# Patient Record
Sex: Female | Born: 1940 | Race: White | Hispanic: No | Smoking: Former smoker
Health system: Southern US, Community
[De-identification: ages and names within clinical notes are randomized; demographics above are authoritative.]

## PROBLEM LIST (undated history)

## (undated) DIAGNOSIS — E079 Disorder of thyroid, unspecified: Secondary | ICD-10-CM

## (undated) DIAGNOSIS — C4492 Squamous cell carcinoma of skin, unspecified: Secondary | ICD-10-CM

## (undated) DIAGNOSIS — C801 Malignant (primary) neoplasm, unspecified: Secondary | ICD-10-CM

## (undated) DIAGNOSIS — E119 Type 2 diabetes mellitus without complications: Secondary | ICD-10-CM

## (undated) DIAGNOSIS — E7800 Pure hypercholesterolemia, unspecified: Secondary | ICD-10-CM

## (undated) HISTORY — PX: ABDOMINAL HYSTERECTOMY: SHX81

## (undated) HISTORY — DX: Squamous cell carcinoma of skin, unspecified: C44.92

## (undated) HISTORY — PX: CATARACT EXTRACTION: SUR2

## (undated) HISTORY — PX: ROTATOR CUFF REPAIR: SHX139

## (undated) HISTORY — PX: REPLACEMENT TOTAL KNEE: SUR1224

---

## 2004-06-15 ENCOUNTER — Other Ambulatory Visit: Payer: Self-pay

## 2005-11-28 ENCOUNTER — Emergency Department: Payer: Self-pay | Admitting: Emergency Medicine

## 2005-12-01 ENCOUNTER — Ambulatory Visit: Payer: Self-pay | Admitting: Family Medicine

## 2005-12-09 ENCOUNTER — Ambulatory Visit: Payer: Self-pay | Admitting: Otolaryngology

## 2005-12-15 ENCOUNTER — Ambulatory Visit: Payer: Self-pay | Admitting: Otolaryngology

## 2006-04-25 ENCOUNTER — Ambulatory Visit: Payer: Self-pay | Admitting: Otolaryngology

## 2006-05-11 ENCOUNTER — Ambulatory Visit: Payer: Self-pay | Admitting: Oncology

## 2006-05-12 ENCOUNTER — Ambulatory Visit: Payer: Self-pay | Admitting: Oncology

## 2006-06-03 ENCOUNTER — Ambulatory Visit: Payer: Self-pay | Admitting: Oncology

## 2006-07-03 ENCOUNTER — Ambulatory Visit: Payer: Self-pay | Admitting: Oncology

## 2006-08-03 ENCOUNTER — Ambulatory Visit: Payer: Self-pay | Admitting: Oncology

## 2006-09-02 ENCOUNTER — Ambulatory Visit: Payer: Self-pay | Admitting: Oncology

## 2006-10-03 ENCOUNTER — Ambulatory Visit: Payer: Self-pay | Admitting: Oncology

## 2006-11-03 ENCOUNTER — Ambulatory Visit: Payer: Self-pay | Admitting: Oncology

## 2006-12-04 ENCOUNTER — Ambulatory Visit: Payer: Self-pay | Admitting: Family Medicine

## 2007-02-01 ENCOUNTER — Ambulatory Visit: Payer: Self-pay | Admitting: Oncology

## 2007-02-02 ENCOUNTER — Ambulatory Visit: Payer: Self-pay | Admitting: Oncology

## 2007-02-23 ENCOUNTER — Ambulatory Visit: Payer: Self-pay | Admitting: Oncology

## 2007-03-04 ENCOUNTER — Ambulatory Visit: Payer: Self-pay | Admitting: Oncology

## 2007-07-04 ENCOUNTER — Ambulatory Visit: Payer: Self-pay | Admitting: Oncology

## 2007-07-09 ENCOUNTER — Ambulatory Visit: Payer: Self-pay | Admitting: Oncology

## 2007-08-04 ENCOUNTER — Ambulatory Visit: Payer: Self-pay | Admitting: Oncology

## 2007-11-04 ENCOUNTER — Ambulatory Visit: Payer: Self-pay | Admitting: Oncology

## 2007-11-12 ENCOUNTER — Ambulatory Visit: Payer: Self-pay | Admitting: Oncology

## 2007-11-22 DIAGNOSIS — J309 Allergic rhinitis, unspecified: Secondary | ICD-10-CM | POA: Insufficient documentation

## 2007-11-22 DIAGNOSIS — I8290 Acute embolism and thrombosis of unspecified vein: Secondary | ICD-10-CM | POA: Insufficient documentation

## 2007-11-22 DIAGNOSIS — I82409 Acute embolism and thrombosis of unspecified deep veins of unspecified lower extremity: Secondary | ICD-10-CM | POA: Insufficient documentation

## 2007-11-22 DIAGNOSIS — J989 Respiratory disorder, unspecified: Secondary | ICD-10-CM | POA: Insufficient documentation

## 2007-11-22 DIAGNOSIS — M1990 Unspecified osteoarthritis, unspecified site: Secondary | ICD-10-CM | POA: Insufficient documentation

## 2007-11-23 DIAGNOSIS — M9979 Connective tissue and disc stenosis of intervertebral foramina of abdomen and other regions: Secondary | ICD-10-CM | POA: Insufficient documentation

## 2007-11-23 DIAGNOSIS — C8598 Non-Hodgkin lymphoma, unspecified, lymph nodes of multiple sites: Secondary | ICD-10-CM | POA: Insufficient documentation

## 2007-11-23 DIAGNOSIS — K2270 Barrett's esophagus without dysplasia: Secondary | ICD-10-CM | POA: Insufficient documentation

## 2007-11-26 DIAGNOSIS — E039 Hypothyroidism, unspecified: Secondary | ICD-10-CM | POA: Insufficient documentation

## 2007-12-02 ENCOUNTER — Ambulatory Visit: Payer: Self-pay | Admitting: Oncology

## 2007-12-25 ENCOUNTER — Ambulatory Visit: Payer: Self-pay | Admitting: Family Medicine

## 2008-01-03 ENCOUNTER — Ambulatory Visit: Payer: Self-pay | Admitting: Gastroenterology

## 2008-01-03 LAB — HM COLONOSCOPY

## 2008-02-08 ENCOUNTER — Emergency Department: Payer: Self-pay | Admitting: Internal Medicine

## 2008-02-15 ENCOUNTER — Emergency Department: Payer: Self-pay | Admitting: Emergency Medicine

## 2008-03-05 ENCOUNTER — Ambulatory Visit: Payer: Self-pay | Admitting: Gastroenterology

## 2008-03-26 ENCOUNTER — Ambulatory Visit: Payer: Self-pay | Admitting: Radiation Oncology

## 2008-03-26 ENCOUNTER — Ambulatory Visit: Payer: Self-pay | Admitting: Oncology

## 2008-04-02 ENCOUNTER — Ambulatory Visit: Payer: Self-pay | Admitting: Radiation Oncology

## 2008-05-03 ENCOUNTER — Ambulatory Visit: Payer: Self-pay | Admitting: Oncology

## 2008-05-28 ENCOUNTER — Ambulatory Visit: Payer: Self-pay | Admitting: Oncology

## 2008-06-03 ENCOUNTER — Ambulatory Visit: Payer: Self-pay | Admitting: Oncology

## 2008-11-03 ENCOUNTER — Ambulatory Visit: Payer: Self-pay | Admitting: Oncology

## 2008-11-27 ENCOUNTER — Ambulatory Visit: Payer: Self-pay | Admitting: Oncology

## 2008-12-01 ENCOUNTER — Ambulatory Visit: Payer: Self-pay | Admitting: Oncology

## 2009-03-03 ENCOUNTER — Ambulatory Visit: Payer: Self-pay | Admitting: Oncology

## 2009-03-26 ENCOUNTER — Ambulatory Visit: Payer: Self-pay | Admitting: Oncology

## 2009-04-02 ENCOUNTER — Ambulatory Visit: Payer: Self-pay | Admitting: Oncology

## 2009-04-19 ENCOUNTER — Emergency Department: Payer: Self-pay | Admitting: Emergency Medicine

## 2009-04-20 ENCOUNTER — Emergency Department: Payer: Self-pay | Admitting: Internal Medicine

## 2009-05-03 ENCOUNTER — Ambulatory Visit: Payer: Self-pay | Admitting: Oncology

## 2009-05-28 ENCOUNTER — Ambulatory Visit: Payer: Self-pay | Admitting: Oncology

## 2009-06-03 ENCOUNTER — Ambulatory Visit: Payer: Self-pay | Admitting: Oncology

## 2009-07-15 ENCOUNTER — Ambulatory Visit: Payer: Self-pay | Admitting: Family Medicine

## 2009-11-20 DIAGNOSIS — E559 Vitamin D deficiency, unspecified: Secondary | ICD-10-CM | POA: Insufficient documentation

## 2009-12-01 ENCOUNTER — Ambulatory Visit: Payer: Self-pay | Admitting: Oncology

## 2009-12-17 ENCOUNTER — Ambulatory Visit: Payer: Self-pay | Admitting: Oncology

## 2010-01-01 ENCOUNTER — Ambulatory Visit: Payer: Self-pay | Admitting: Oncology

## 2010-07-14 ENCOUNTER — Emergency Department: Payer: Self-pay | Admitting: Emergency Medicine

## 2010-08-20 ENCOUNTER — Ambulatory Visit: Payer: Self-pay | Admitting: Oncology

## 2010-09-02 ENCOUNTER — Ambulatory Visit: Payer: Self-pay | Admitting: Oncology

## 2010-09-08 ENCOUNTER — Ambulatory Visit: Payer: Self-pay | Admitting: Specialist

## 2010-10-06 ENCOUNTER — Ambulatory Visit: Payer: Self-pay | Admitting: Specialist

## 2010-10-21 ENCOUNTER — Ambulatory Visit: Payer: Self-pay | Admitting: Family Medicine

## 2011-01-10 ENCOUNTER — Inpatient Hospital Stay: Payer: Self-pay | Admitting: Surgery

## 2011-02-18 ENCOUNTER — Ambulatory Visit: Payer: Self-pay | Admitting: Oncology

## 2011-03-04 ENCOUNTER — Ambulatory Visit: Payer: Self-pay | Admitting: Oncology

## 2011-08-29 ENCOUNTER — Ambulatory Visit: Payer: Self-pay | Admitting: Oncology

## 2011-09-03 ENCOUNTER — Ambulatory Visit: Payer: Self-pay | Admitting: Oncology

## 2011-12-01 ENCOUNTER — Ambulatory Visit: Payer: Self-pay | Admitting: Family Medicine

## 2012-04-11 ENCOUNTER — Ambulatory Visit: Payer: Self-pay | Admitting: Family Medicine

## 2012-08-27 ENCOUNTER — Ambulatory Visit: Payer: Self-pay | Admitting: Oncology

## 2012-08-27 LAB — CBC CANCER CENTER
Basophil #: 0 x10 3/mm (ref 0.0–0.1)
Eosinophil #: 0.2 x10 3/mm (ref 0.0–0.7)
HGB: 13.1 g/dL (ref 12.0–16.0)
MCH: 31.6 pg (ref 26.0–34.0)
MCHC: 33.9 g/dL (ref 32.0–36.0)
Monocyte #: 0.5 x10 3/mm (ref 0.2–0.9)
Monocyte %: 9.7 %
Neutrophil #: 2.7 x10 3/mm (ref 1.4–6.5)
Neutrophil %: 50 %
Platelet: 183 x10 3/mm (ref 150–440)
RBC: 4.14 10*6/uL (ref 3.80–5.20)
RDW: 14.9 % — ABNORMAL HIGH (ref 11.5–14.5)

## 2012-08-27 LAB — COMPREHENSIVE METABOLIC PANEL
Alkaline Phosphatase: 63 U/L (ref 50–136)
Anion Gap: 12 (ref 7–16)
BUN: 16 mg/dL (ref 7–18)
Bilirubin,Total: 0.3 mg/dL (ref 0.2–1.0)
Co2: 27 mmol/L (ref 21–32)
Creatinine: 0.85 mg/dL (ref 0.60–1.30)
EGFR (African American): >60
EGFR (Non-African Amer.): >60
Osmolality: 282 (ref 275–301)
SGOT(AST): 17 U/L (ref 15–37)
SGPT (ALT): 26 U/L (ref 12–78)
Total Protein: 7.6 g/dL (ref 6.4–8.2)

## 2012-08-27 LAB — SEDIMENTATION RATE: Erythrocyte Sed Rate: 31 mm/hr — ABNORMAL HIGH (ref 0–30)

## 2012-09-02 ENCOUNTER — Ambulatory Visit: Payer: Self-pay | Admitting: Oncology

## 2013-03-26 ENCOUNTER — Ambulatory Visit: Payer: Self-pay | Admitting: Family Medicine

## 2013-08-26 ENCOUNTER — Ambulatory Visit: Payer: Self-pay | Admitting: Oncology

## 2013-08-26 LAB — CBC CANCER CENTER
Eosinophil %: 5.1 %
HCT: 37.6 % (ref 35.0–47.0)
HGB: 12.9 g/dL (ref 12.0–16.0)
MCH: 31.4 pg (ref 26.0–34.0)
MCHC: 34.2 g/dL (ref 32.0–36.0)
MCV: 92 fL (ref 80–100)
Monocyte #: 0.5 x10 3/mm (ref 0.2–0.9)
Monocyte %: 7.8 %
Neutrophil #: 3.6 x10 3/mm (ref 1.4–6.5)
Platelet: 212 x10 3/mm (ref 150–440)
RDW: 14.9 % — ABNORMAL HIGH (ref 11.5–14.5)

## 2013-08-26 LAB — COMPREHENSIVE METABOLIC PANEL
Alkaline Phosphatase: 63 U/L
Anion Gap: 6 — ABNORMAL LOW (ref 7–16)
BUN: 18 mg/dL (ref 7–18)
Bilirubin,Total: 0.4 mg/dL (ref 0.2–1.0)
Calcium, Total: 9 mg/dL (ref 8.5–10.1)
Chloride: 102 mmol/L (ref 98–107)
Co2: 31 mmol/L (ref 21–32)
Creatinine: 0.8 mg/dL (ref 0.60–1.30)
EGFR (African American): >60
EGFR (Non-African Amer.): >60
Glucose: 138 mg/dL — ABNORMAL HIGH (ref 65–99)
Sodium: 139 mmol/L (ref 136–145)
Total Protein: 7.6 g/dL (ref 6.4–8.2)

## 2013-08-26 LAB — LACTATE DEHYDROGENASE: LDH: 230 U/L (ref 81–246)

## 2013-09-02 ENCOUNTER — Ambulatory Visit: Payer: Self-pay | Admitting: Oncology

## 2014-06-04 DIAGNOSIS — Z8542 Personal history of malignant neoplasm of other parts of uterus: Secondary | ICD-10-CM | POA: Insufficient documentation

## 2014-06-04 DIAGNOSIS — Z8669 Personal history of other diseases of the nervous system and sense organs: Secondary | ICD-10-CM | POA: Insufficient documentation

## 2014-06-04 DIAGNOSIS — D239 Other benign neoplasm of skin, unspecified: Secondary | ICD-10-CM | POA: Insufficient documentation

## 2014-06-04 DIAGNOSIS — F458 Other somatoform disorders: Secondary | ICD-10-CM | POA: Insufficient documentation

## 2014-06-04 DIAGNOSIS — Q659 Congenital deformity of hip, unspecified: Secondary | ICD-10-CM | POA: Insufficient documentation

## 2014-09-04 LAB — LIPID PANEL
Cholesterol: 224 mg/dL — AB (ref 0–200)
HDL: 64 mg/dL (ref 35–70)
LDL Cholesterol: 134 mg/dL
Triglycerides: 129 mg/dL (ref 40–160)

## 2014-09-04 LAB — CBC AND DIFFERENTIAL: WBC: 6.1 10*3/mL

## 2014-09-04 LAB — TSH: TSH: 0.31 u[IU]/mL — AB (ref 0.41–5.90)

## 2014-09-04 LAB — BASIC METABOLIC PANEL
BUN: 14 mg/dL (ref 4–21)
Creatinine: 0.8 mg/dL (ref 0.5–1.1)
GLUCOSE: 113 mg/dL
SODIUM: 141 mmol/L (ref 137–147)

## 2014-09-04 LAB — HEMOGLOBIN A1C: Hgb A1c MFr Bld: 5.9 % (ref 4.0–6.0)

## 2014-09-11 LAB — HM DEXA SCAN: HM DEXA SCAN: NORMAL

## 2014-10-06 ENCOUNTER — Ambulatory Visit: Payer: Self-pay | Admitting: Family Medicine

## 2014-10-06 LAB — HM MAMMOGRAPHY

## 2014-10-20 ENCOUNTER — Emergency Department: Payer: Self-pay | Admitting: Emergency Medicine

## 2014-10-20 LAB — CBC WITH DIFFERENTIAL/PLATELET
Basophil #: 0 10*3/uL (ref 0.0–0.1)
Basophil %: 0.2 %
Eosinophil #: 0.1 10*3/uL (ref 0.0–0.7)
Eosinophil %: 1.2 %
HCT: 41.5 % (ref 35.0–47.0)
HGB: 13.8 g/dL (ref 12.0–16.0)
Lymphocyte #: 0.4 10*3/uL — ABNORMAL LOW (ref 1.0–3.6)
Lymphocyte %: 5.5 %
MCH: 30.6 pg (ref 26.0–34.0)
MCHC: 33.3 g/dL (ref 32.0–36.0)
MCV: 92 fL (ref 80–100)
MONO ABS: 0.2 x10 3/mm (ref 0.2–0.9)
Monocyte %: 3.1 %
NEUTROS PCT: 90 %
Neutrophil #: 6.9 10*3/uL — ABNORMAL HIGH (ref 1.4–6.5)
PLATELETS: 164 10*3/uL (ref 150–440)
RBC: 4.52 10*6/uL (ref 3.80–5.20)
RDW: 15 % — ABNORMAL HIGH (ref 11.5–14.5)
WBC: 7.7 10*3/uL (ref 3.6–11.0)

## 2014-10-20 LAB — COMPREHENSIVE METABOLIC PANEL
ALBUMIN: 3.8 g/dL (ref 3.4–5.0)
ALK PHOS: 54 U/L
Anion Gap: 9 (ref 7–16)
BILIRUBIN TOTAL: 0.9 mg/dL (ref 0.2–1.0)
BUN: 20 mg/dL — ABNORMAL HIGH (ref 7–18)
CREATININE: 0.83 mg/dL (ref 0.60–1.30)
Calcium, Total: 8.3 mg/dL — ABNORMAL LOW (ref 8.5–10.1)
Chloride: 101 mmol/L (ref 98–107)
Co2: 26 mmol/L (ref 21–32)
EGFR (African American): >60
EGFR (Non-African Amer.): >60
Glucose: 123 mg/dL — ABNORMAL HIGH (ref 65–99)
OSMOLALITY: 276 (ref 275–301)
POTASSIUM: 3.7 mmol/L (ref 3.5–5.1)
SGOT(AST): 17 U/L (ref 15–37)
SGPT (ALT): 24 U/L
Sodium: 136 mmol/L (ref 136–145)
Total Protein: 7.7 g/dL (ref 6.4–8.2)

## 2014-10-20 LAB — TROPONIN I: Troponin-I: <0.02 ng/mL

## 2014-10-20 LAB — LIPASE, BLOOD: LIPASE: 106 U/L (ref 73–393)

## 2015-03-04 ENCOUNTER — Other Ambulatory Visit: Payer: Self-pay | Admitting: Family Medicine

## 2015-04-29 ENCOUNTER — Other Ambulatory Visit: Payer: Self-pay | Admitting: Family Medicine

## 2015-04-29 DIAGNOSIS — M129 Arthropathy, unspecified: Secondary | ICD-10-CM

## 2015-05-26 ENCOUNTER — Other Ambulatory Visit: Payer: Self-pay | Admitting: Family Medicine

## 2015-05-26 DIAGNOSIS — E039 Hypothyroidism, unspecified: Secondary | ICD-10-CM

## 2015-05-26 DIAGNOSIS — E7800 Pure hypercholesterolemia, unspecified: Secondary | ICD-10-CM | POA: Insufficient documentation

## 2015-05-26 NOTE — Telephone Encounter (Signed)
Last apt 11/2014  Thanks,   -Mickel Baas

## 2015-05-28 ENCOUNTER — Other Ambulatory Visit: Payer: Self-pay

## 2015-05-28 ENCOUNTER — Emergency Department
Admission: EM | Admit: 2015-05-28 | Discharge: 2015-05-28 | Disposition: A | Discharge status: Home / Self Care | Payer: PPO | Attending: Emergency Medicine | Admitting: Emergency Medicine

## 2015-05-28 ENCOUNTER — Encounter: Payer: Self-pay | Admitting: Emergency Medicine

## 2015-05-28 DIAGNOSIS — M778 Other enthesopathies, not elsewhere classified: Secondary | ICD-10-CM

## 2015-05-28 DIAGNOSIS — Z791 Long term (current) use of non-steroidal anti-inflammatories (NSAID): Secondary | ICD-10-CM | POA: Insufficient documentation

## 2015-05-28 DIAGNOSIS — Z87891 Personal history of nicotine dependence: Secondary | ICD-10-CM | POA: Insufficient documentation

## 2015-05-28 DIAGNOSIS — Z7952 Long term (current) use of systemic steroids: Secondary | ICD-10-CM | POA: Diagnosis not present

## 2015-05-28 DIAGNOSIS — E119 Type 2 diabetes mellitus without complications: Secondary | ICD-10-CM | POA: Diagnosis not present

## 2015-05-28 DIAGNOSIS — Z79899 Other long term (current) drug therapy: Secondary | ICD-10-CM | POA: Insufficient documentation

## 2015-05-28 DIAGNOSIS — M7582 Other shoulder lesions, left shoulder: Secondary | ICD-10-CM

## 2015-05-28 DIAGNOSIS — M7592 Shoulder lesion, unspecified, left shoulder: Secondary | ICD-10-CM | POA: Diagnosis not present

## 2015-05-28 DIAGNOSIS — M79602 Pain in left arm: Secondary | ICD-10-CM | POA: Diagnosis present

## 2015-05-28 HISTORY — DX: Pure hypercholesterolemia, unspecified: E78.00

## 2015-05-28 HISTORY — DX: Type 2 diabetes mellitus without complications: E11.9

## 2015-05-28 HISTORY — DX: Disorder of thyroid, unspecified: E07.9

## 2015-05-28 LAB — CBC WITH DIFFERENTIAL/PLATELET
BASOS PCT: 1 %
Basophils Absolute: 0 10*3/uL (ref 0–0.1)
EOS ABS: 0.2 10*3/uL (ref 0–0.7)
EOS PCT: 4 %
HCT: 36.8 % (ref 35.0–47.0)
Hemoglobin: 12.7 g/dL (ref 12.0–16.0)
LYMPHS ABS: 1.3 10*3/uL (ref 1.0–3.6)
Lymphocytes Relative: 28 %
MCH: 31.4 pg (ref 26.0–34.0)
MCHC: 34.6 g/dL (ref 32.0–36.0)
MCV: 90.8 fL (ref 80.0–100.0)
MONOS PCT: 8 %
Monocytes Absolute: 0.4 10*3/uL (ref 0.2–0.9)
NEUTROS PCT: 59 %
Neutro Abs: 2.7 10*3/uL (ref 1.4–6.5)
PLATELETS: 169 10*3/uL (ref 150–440)
RBC: 4.05 MIL/uL (ref 3.80–5.20)
RDW: 14.2 % (ref 11.5–14.5)
WBC: 4.6 10*3/uL (ref 3.6–11.0)

## 2015-05-28 LAB — COMPREHENSIVE METABOLIC PANEL
ALBUMIN: 4.1 g/dL (ref 3.5–5.0)
ALT: 17 U/L (ref 14–54)
ANION GAP: 11 (ref 5–15)
AST: 35 U/L (ref 15–41)
Alkaline Phosphatase: 41 U/L (ref 38–126)
BUN: 17 mg/dL (ref 6–20)
CHLORIDE: 105 mmol/L (ref 101–111)
CO2: 25 mmol/L (ref 22–32)
Calcium: 9.2 mg/dL (ref 8.9–10.3)
Creatinine, Ser: 0.81 mg/dL (ref 0.44–1.00)
GFR calc non Af Amer: >60 mL/min (ref >60)
GLUCOSE: 142 mg/dL — AB (ref 65–99)
POTASSIUM: 3.5 mmol/L (ref 3.5–5.1)
SODIUM: 141 mmol/L (ref 135–145)
Total Bilirubin: 0.6 mg/dL (ref 0.3–1.2)
Total Protein: 7.4 g/dL (ref 6.5–8.1)

## 2015-05-28 LAB — TROPONIN I: Troponin I: <0.03 ng/mL (ref <0.031)

## 2015-05-28 MED ORDER — OXYCODONE-ACETAMINOPHEN 5-325 MG PO TABS: 1.0000 | ORAL_TABLET | Freq: Four times a day (QID) | ORAL | Status: DC | PRN

## 2015-05-28 MED ORDER — DIAZEPAM 5 MG PO TABS: 5.0000 mg | ORAL_TABLET | Freq: Three times a day (TID) | ORAL | Status: DC | PRN

## 2015-05-28 MED ORDER — NAPROXEN 500 MG PO TABS
500.0000 mg | ORAL_TABLET | Freq: Once | ORAL | Status: AC
  Administered 2015-05-28: 500 mg via ORAL
  Filled 2015-05-28: qty 1

## 2015-05-28 MED ORDER — DEXAMETHASONE 4 MG PO TABS
6.0000 mg | ORAL_TABLET | Freq: Once | ORAL | Status: AC
  Administered 2015-05-28: 6 mg via ORAL
  Filled 2015-05-28: qty 1.5

## 2015-05-28 MED ORDER — OXYCODONE-ACETAMINOPHEN 5-325 MG PO TABS
1.0000 | ORAL_TABLET | Freq: Once | ORAL | Status: AC
  Administered 2015-05-28: 1 via ORAL
  Filled 2015-05-28: qty 1

## 2015-05-28 NOTE — ED Provider Notes (Signed)
[facility] Emergency Department Provider Note  ____________________________________________  Time seen: 12:30 PM  I have reviewed the triage vital signs and the nursing notes.   HISTORY  Chief Complaint Arm Pain    HPI Sherry Jones is a 74 y.o. female who reports left shoulder pain for the past 3 days. It is intermittent lasting 20-30 minutes. Not exertional, not pleuritic. No chest pain or shortness of breath. The pain as a throbbing pain that starts at the shoulder and proceeds laterally down the arm to the elbow. It is worse with movement. She denies any recent injury slips trips or falls. No heavy lifting. She did have an eye surgery for cataract removal one day prior to the onset of this. She has been taking eyedrops antibiotic steroid and NSAID since then.  No fevers or chills, nausea vomiting, abdominal pain, back pain, headache neck pain or vision changes.   Past Medical History  Diagnosis Date  . Diabetes mellitus without complication   . Hypercholesterolemia   . Thyroid disease    diabetes is diet controlled  Patient Active Problem List   Diagnosis Date Noted  . Hypercholesteremia 05/26/2015  . Arthropathy 04/29/2015  . Acquired hypothyroidism 11/26/2007    Past Surgical History  Procedure Laterality Date  . Eye surgery    . Abdominal hysterectomy      Current Outpatient Rx  Name  Route  Sig  Dispense  Refill  . Difluprednate (DUREZOL) 0.05 % EMUL   Right Eye   Place 1 drop into the right eye 3 (three) times daily.         Marland Kitchen levothyroxine (SYNTHROID, LEVOTHROID) 75 MCG tablet      TAKE 1 TABLET BY MOUTH EVERY DAY   90 tablet   3   . meloxicam (MOBIC) 15 MG tablet      TAKE 1 TABLET BY MOUTH EVERY DAY   90 tablet   3   . nepafenac (ILEVRO) 0.3 % ophthalmic suspension   Right Eye   Place 1 drop into the right eye at bedtime.         . simvastatin (ZOCOR) 10 MG tablet      TAKE 1 TABLET BY MOUTH AT BEDTIME   30  tablet   5   . tobramycin (TOBREX) 0.3 % ophthalmic solution   Right Eye   Place 1 drop into the right eye every 6 (six) hours.         . Vitamin D, Ergocalciferol, (DRISDOL) 50000 UNITS CAPS capsule   Oral   Take 50,000 Units by mouth every 30 (thirty) days.         . diazepam (VALIUM) 5 MG tablet   Oral   Take 1 tablet (5 mg total) by mouth every 8 (eight) hours as needed for muscle spasms.   8 tablet   0   . oxyCODONE-acetaminophen (ROXICET) 5-325 MG per tablet   Oral   Take 1 tablet by mouth every 6 (six) hours as needed for severe pain.   12 tablet   0     Allergies Codeine  No family history on file.  Social History Social History  Substance Use Topics  . Smoking status: Former Research scientist (life sciences)  . Smokeless tobacco: None  . Alcohol Use: No    Review of Systems  Constitutional: No fever or chills. No weight changes Eyes:No blurry vision or double vision.  ENT: No sore throat. Cardiovascular: No chest pain. Respiratory: No dyspnea or cough. Gastrointestinal: Negative for  abdominal pain, vomiting and diarrhea.  No BRBPR or melena. Genitourinary: Negative for dysuria, urinary retention, bloody urine, or difficulty urinating. Musculoskeletal: Left shoulder and upper arm pain Skin: Negative for rash. Neurological: Negative for headaches, focal weakness or numbness. Psychiatric:No anxiety or depression.   Endocrine:No hot/cold intolerance, changes in energy, or sleep difficulty.  10-point ROS otherwise negative.  ____________________________________________   PHYSICAL EXAM:  VITAL SIGNS: ED Triage Vitals  Enc Vitals Group     BP 05/28/15 1121 132/75 mmHg     Pulse Rate 05/28/15 1121 78     Resp 05/28/15 1121 14     Temp 05/28/15 1121 97.8 F (36.6 C)     Temp Source 05/28/15 1121 Oral     SpO2 05/28/15 1121 100 %     Weight 05/28/15 1121 115 lb (52.164 kg)     Height 05/28/15 1121 4\' 6"  (1.372 m)     Head Cir --      Peak Flow --      Pain Score  05/28/15 1122 0     Pain Loc --      Pain Edu? --      Excl. in [place]? --      Constitutional: Alert and oriented. Well appearing and in no distress. Eyes: No scleral icterus. No conjunctival pallor. PERRL. EOMI ENT   Head: Normocephalic and atraumatic.   Nose: No congestion/rhinnorhea. No septal hematoma   Mouth/Throat: MMM, no pharyngeal erythema. No peritonsillar mass. No uvula shift.   Neck: No stridor. No SubQ emphysema. No meningismus. Hematological/Lymphatic/Immunilogical: No cervical lymphadenopathy. Cardiovascular: RRR. Normal and symmetric distal pulses are present in all extremities. No murmurs, rubs, or gallops. Respiratory: Normal respiratory effort without tachypnea nor retractions. Breath sounds are clear and equal bilaterally. No wheezes/rales/rhonchi. Gastrointestinal: Soft and nontender. No distention. There is no CVA tenderness.  No rebound, rigidity, or guarding. Genitourinary: deferred Musculoskeletal: Severe tenderness to palpation of the lateral and posterior deltoid heads. Stressing the deltoid muscle against resistance also reproduces the pain. Rest of the arm and chest wall are unremarkable and nontender with full range of motion of the shoulder elbow and hands. Neurologic:   Normal speech and language.  CN 2-10 normal. Motor grossly intact. No pronator drift.  Normal gait. No gross focal neurologic deficits are appreciated.  Skin:  Skin is warm, dry and intact. No rash noted.  No petechiae, purpura, or bullae. Psychiatric: Mood and affect are normal. Speech and behavior are normal. Patient exhibits appropriate insight and judgment.  ____________________________________________    LABS (pertinent positives/negatives) (all labs ordered are listed, but only abnormal results are displayed) Labs Reviewed  COMPREHENSIVE METABOLIC PANEL - Abnormal; Notable for the following:    Glucose, Bld 142 (*)    All other components within normal limits  CBC  WITH DIFFERENTIAL/PLATELET  TROPONIN I   ____________________________________________   EKG  Interpreted by me Normal sinus rhythm rate of 79, normal axis intervals QRS and ST segments and T waves.  ____________________________________________    RADIOLOGY    ____________________________________________   PROCEDURES  ____________________________________________   INITIAL IMPRESSION / ASSESSMENT AND PLAN / ED COURSE  Pertinent labs & imaging results that were available during my care of the patient were reviewed by me and considered in my medical decision making (see chart for details).  Patient presents with clinically apparent deltoid tendinitis. No clear cause of this, but there does not appear to be any cellulitis abscess necrotizing fasciitis fracture or dislocation. Very low suspicion for cardiopulmonary pathology such  as ACS PE TAD pneumothorax carditis mediastinitis pneumonia or sepsis. Patient given a one-time dose of Decadron in the ED, will be prescribed Percocet and Valium as well as given a sling for symptom control. I did encourage her to follow up with primary care for further monitoring of her symptoms and consideration of physical therapy referral as needed.  ____________________________________________   FINAL CLINICAL IMPRESSION(S) / ED DIAGNOSES  Final diagnoses:  Deltoid tendonitis of left shoulder      Carrie Mew, MD 05/28/15 1255

## 2015-05-28 NOTE — ED Notes (Signed)
Had some eye surgery on Monday  Developed left shoulder and arm pain since

## 2015-05-28 NOTE — Discharge Instructions (Signed)
You were prescribed a medication that is potentially sedating. Do not drink alcohol, drive or participate in any other potentially dangerous activities while taking this medication as it may make you sleepy. Do not take this medication with any other sedating medications, either prescription or over-the-counter. If you were prescribed Percocet or Vicodin, do not take these with acetaminophen (Tylenol) as it is already contained within these medications.   Opioid pain medications (or "narcotics") can be habit forming.  Use it as little as possible to achieve adequate pain control.  Do not use or use it with extreme caution if you have a history of opiate abuse or dependence.  If you are on a pain contract with your primary care doctor or a pain specialist, be sure to let them know you were prescribed this medication today from the [facility] Emergency Department.  This medication is intended for your use only - do not give any to anyone else and keep it in a secure place where nobody else, especially children and pets, have access to it.  It will also cause or worsen constipation, so you may want to consider taking an over-the-counter stool softener while you are taking this medication.  Your evaluation today reveals tendinitis of the deltoid muscle tendons around the left shoulder. Wear the sling for comfort, and take Percocet as needed for pain. Rest the shoulder for the next week, and use a heating pad to help relax the muscles. You may use Valium at night to help with muscle relaxation and sleep. Follow up with Dr. Venia Minks within one week for further evaluation and consideration of possible physical therapy referral.  Tendinitis Tendinitis is swelling and inflammation of the tendons. Tendons are band-like tissues that connect muscle to bone. Tendinitis commonly occurs in the:   Shoulders (rotator cuff).  Heels (Achilles tendon).  Elbows (triceps tendon). CAUSES Tendinitis is usually caused  by overusing the tendon, muscles, and joints involved. When the tissue surrounding a tendon (synovium) becomes inflamed, it is called tenosynovitis. Tendinitis commonly develops in people whose jobs require repetitive motions. SYMPTOMS  Pain.  Tenderness.  Mild swelling. DIAGNOSIS Tendinitis is usually diagnosed by physical exam. Your health care provider may also order X-rays or other imaging tests. TREATMENT Your health care provider may recommend certain medicines or exercises for your treatment. HOME CARE INSTRUCTIONS   Use a sling or splint for as long as directed by your health care provider until the pain decreases.  Put ice on the injured area.  Put ice in a plastic bag.  Place a towel between your skin and the bag.  Leave the ice on for 15-20 minutes, 3-4 times a day, or as directed by your health care provider.  Avoid using the limb while the tendon is painful. Perform gentle range of motion exercises only as directed by your health care provider. Stop exercises if pain or discomfort increase, unless directed otherwise by your health care provider.  Only take over-the-counter or prescription medicines for pain, discomfort, or fever as directed by your health care provider. SEEK MEDICAL CARE IF:   Your pain and swelling increase.  You develop new, unexplained symptoms, especially increased numbness in the hands. MAKE SURE YOU:   Understand these instructions.  Will watch your condition.  Will get help right away if you are not doing well or get worse. Document Released: 09/16/2000 Document Revised: 02/03/2014 Document Reviewed: 12/06/2010 [facility] Patient Information 2015 [place], [place]. This information is not intended to replace advice given to you  by your health care provider. Make sure you discuss any questions you have with your health care provider. ° °

## 2015-05-29 ENCOUNTER — Ambulatory Visit (INDEPENDENT_AMBULATORY_CARE_PROVIDER_SITE_OTHER): Payer: PPO | Admitting: Family Medicine

## 2015-05-29 ENCOUNTER — Telehealth: Payer: Self-pay | Admitting: Family Medicine

## 2015-05-29 ENCOUNTER — Encounter: Payer: Self-pay | Admitting: Family Medicine

## 2015-05-29 VITALS — BP 124/54 | HR 84 | Temp 98.1°F | Resp 16 | Ht <= 58 in | Wt 119.0 lb

## 2015-05-29 DIAGNOSIS — D229 Melanocytic nevi, unspecified: Secondary | ICD-10-CM | POA: Insufficient documentation

## 2015-05-29 DIAGNOSIS — M25512 Pain in left shoulder: Secondary | ICD-10-CM | POA: Insufficient documentation

## 2015-05-29 NOTE — Progress Notes (Signed)
Subjective:    Patient ID: Sherry Jones, female    DOB: [birth year], 74 y.o.   MRN: [id number]  Arm Pain  The incident occurred 3 to 5 days ago. There was no injury mechanism. The pain is present in the left shoulder. Quality: "pulsating throb" The pain radiates to the left hand. The pain is at a severity of 5/10. The pain is moderate. The pain has been improving since the incident. Associated symptoms include tingling. Pertinent negatives include no chest pain, muscle weakness or numbness. Associated symptoms comments: "My little finger will get cold". She has tried acetaminophen for the symptoms. The treatment provided moderate relief.     Follow up Hospitalization  Patient was admitted to [facility] on 05/28/2015 and discharged on same day. She was treated for deltoid tendonitis of left shoulder. Treatment for this included Valium and Percocet. She reports no compliance with treatment. The Percocet causes N/V, and pt has not tried the Valium, would like to ask PCP's advice about this medication. She reports this condition is Improved. Did have eye surgery on Monday. Went to ER because they were concerned about blood clot or heart.  Was ok. Diagnosed with tendonitis as above.  Can not take codeine or hydrocodone.  In a sling today. ------------------------------------------------------------------------------------   Review of Systems  Constitutional: Positive for activity change. Negative for fever, chills, diaphoresis, appetite change, fatigue and unexpected weight change.  Cardiovascular: Negative for chest pain.  Neurological: Positive for tingling. Negative for numbness.   BP 124/54 mmHg  Pulse 84  Temp(Src) 98.1 F (36.7 C) (Oral)  Resp 16  Ht 4\' 6"  (1.372 m)  Wt 119 lb (53.978 kg)  BMI 28.68 kg/m2   Patient Active Problem List   Diagnosis Date Noted  . Atypical nevus 05/29/2015  . Shoulder pain, left 05/29/2015  . Hypercholesteremia 05/26/2015  . Arthropathy 04/29/2015  .  Benign neoplasm of skin 06/04/2014  . 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 H/O ear disorder 06/04/2014  . Cancer of uterus 06/04/2014  . Avitaminosis D 11/20/2009  . Diabetes mellitus, type 2 07/13/2009  . Acquired hypothyroidism 11/26/2007  . Adult hypothyroidism 11/26/2007  . Barrett esophagus 11/23/2007  . Narrowing of intervertebral disc space 11/23/2007  . 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 Acute embolism and thrombosis of deep vein of lower extremity 11/22/2007  . Allergic rhinitis 11/22/2007  . Arthritis, degenerative 11/22/2007  . Disease of airway 11/22/2007   Past Medical History  Diagnosis Date  . Diabetes mellitus without complication   . Hypercholesterolemia   . Thyroid disease    Current Outpatient Prescriptions on File Prior to Visit  Medication Sig  . Difluprednate (DUREZOL) 0.05 % EMUL Place 1 drop into the right eye 3 (three) times daily.  Marland Kitchen levothyroxine (SYNTHROID, LEVOTHROID) 75 MCG tablet TAKE 1 TABLET BY MOUTH EVERY DAY  . meloxicam (MOBIC) 15 MG tablet TAKE 1 TABLET BY MOUTH EVERY DAY  . nepafenac (ILEVRO) 0.3 % ophthalmic suspension Place 1 drop into the right eye at bedtime.  . simvastatin (ZOCOR) 10 MG tablet TAKE 1 TABLET BY MOUTH AT BEDTIME  . tobramycin (TOBREX) 0.3 % ophthalmic solution Place 1 drop into the right eye every 6 (six) hours.  . Vitamin D, Ergocalciferol, (DRISDOL) 50000 UNITS CAPS capsule Take 50,000 Units by mouth every 30 (thirty) days.   No current facility-administered medications on file prior to visit.   Allergies  Allergen Reactions  .  Cefadroxil   . Cefuroxime   . Cephalosporins Other (See Comments)  . Ciprofloxacin Other (See Comments)  . Codeine Swelling  . Hydrocodone-Acetaminophen Other (See Comments)  . Levofloxacin Other (See Comments)  . Meloxicam   . Oxycodone   . Tramadol   . Methocarbamol Rash  . Nitrofuran Derivatives  Other (See Comments)    Other Reaction: Intolerance   Past Surgical History  Procedure Laterality Date  . Abdominal hysterectomy    . Eye surgery     Social History   Social History  . Marital Status: Married    Spouse Name: N/A  . Number of Children: N/A  . Years of Education: N/A   Occupational History  . Not on file.   Social History Main Topics  . Smoking status: Former Smoker -- 95 years    Quit date: 01/02/1996  . Smokeless tobacco: Never Used  . Alcohol Use: No  . Drug Use: No  . Sexual Activity: Not on file   Other Topics Concern  . Not on file   Social History Narrative   No family history on file.     Objective:   Physical Exam  Constitutional: She is oriented to person, place, and time. She appears well-developed and well-nourished.  Musculoskeletal: She exhibits tenderness (upper back. In sling. ).  Neurological: She is alert and oriented to person, place, and time.   BP 124/54 mmHg  Pulse 84  Temp(Src) 98.1 F (36.7 C) (Oral)  Resp 16  Ht 4\' 6"  (1.372 m)  Wt 119 lb (53.978 kg)  BMI 28.68 kg/m2       Assessment & Plan:  1. Shoulder pain, left Do not take hydrocodone or valium. Continue Meloxicam  And wear sling. Will refer back to orthopedic doctor, Dr. Alvin Critchley, at [facility] that did her shoulder surgery.    - Ambulatory referral to Orthopedic Surgery    Margarita Rana, MD

## 2015-05-29 NOTE — Telephone Encounter (Signed)
Pt was discharged from [facility] ER on 05/28/2015 for left arm pain.  I have scheduled an appt today for a follow up/MW

## 2015-06-16 DIAGNOSIS — E119 Type 2 diabetes mellitus without complications: Secondary | ICD-10-CM | POA: Insufficient documentation

## 2015-06-16 DIAGNOSIS — Z79899 Other long term (current) drug therapy: Secondary | ICD-10-CM | POA: Insufficient documentation

## 2015-06-16 DIAGNOSIS — Z87891 Personal history of nicotine dependence: Secondary | ICD-10-CM | POA: Insufficient documentation

## 2015-06-16 DIAGNOSIS — M5136 Other intervertebral disc degeneration, lumbar region: Secondary | ICD-10-CM | POA: Insufficient documentation

## 2015-06-16 DIAGNOSIS — R51 Headache: Secondary | ICD-10-CM | POA: Insufficient documentation

## 2015-06-17 ENCOUNTER — Encounter: Payer: Self-pay | Admitting: *Deleted

## 2015-06-17 ENCOUNTER — Emergency Department: Payer: PPO

## 2015-06-17 ENCOUNTER — Emergency Department
Admission: EM | Admit: 2015-06-17 | Discharge: 2015-06-17 | Disposition: A | Discharge status: Home / Self Care | Payer: PPO | Attending: Emergency Medicine | Admitting: Emergency Medicine

## 2015-06-17 DIAGNOSIS — M5136 Other intervertebral disc degeneration, lumbar region: Secondary | ICD-10-CM

## 2015-06-17 DIAGNOSIS — M549 Dorsalgia, unspecified: Secondary | ICD-10-CM

## 2015-06-17 DIAGNOSIS — R51 Headache: Secondary | ICD-10-CM | POA: Diagnosis not present

## 2015-06-17 DIAGNOSIS — Z79899 Other long term (current) drug therapy: Secondary | ICD-10-CM | POA: Diagnosis not present

## 2015-06-17 DIAGNOSIS — Z87891 Personal history of nicotine dependence: Secondary | ICD-10-CM | POA: Diagnosis not present

## 2015-06-17 DIAGNOSIS — E119 Type 2 diabetes mellitus without complications: Secondary | ICD-10-CM | POA: Diagnosis not present

## 2015-06-17 HISTORY — DX: Malignant (primary) neoplasm, unspecified: C80.1

## 2015-06-17 LAB — COMPREHENSIVE METABOLIC PANEL
ALBUMIN: 3.8 g/dL (ref 3.5–5.0)
ALK PHOS: 44 U/L (ref 38–126)
ALT: 17 U/L (ref 14–54)
ANION GAP: 8 (ref 5–15)
AST: 26 U/L (ref 15–41)
BUN: 15 mg/dL (ref 6–20)
CALCIUM: 8.7 mg/dL — AB (ref 8.9–10.3)
CO2: 26 mmol/L (ref 22–32)
Chloride: 106 mmol/L (ref 101–111)
Creatinine, Ser: 0.69 mg/dL (ref 0.44–1.00)
GFR calc non Af Amer: >60 mL/min (ref >60)
GLUCOSE: 158 mg/dL — AB (ref 65–99)
POTASSIUM: 3.2 mmol/L — AB (ref 3.5–5.1)
SODIUM: 140 mmol/L (ref 135–145)
Total Bilirubin: 0.8 mg/dL (ref 0.3–1.2)
Total Protein: 6.8 g/dL (ref 6.5–8.1)

## 2015-06-17 LAB — CBC
HCT: 34.2 % — ABNORMAL LOW (ref 35.0–47.0)
Hemoglobin: 11.9 g/dL — ABNORMAL LOW (ref 12.0–16.0)
MCH: 31.4 pg (ref 26.0–34.0)
MCHC: 34.7 g/dL (ref 32.0–36.0)
MCV: 90.5 fL (ref 80.0–100.0)
PLATELETS: 157 10*3/uL (ref 150–440)
RBC: 3.77 MIL/uL — ABNORMAL LOW (ref 3.80–5.20)
RDW: 14 % (ref 11.5–14.5)
WBC: 7.8 10*3/uL (ref 3.6–11.0)

## 2015-06-17 LAB — URINALYSIS COMPLETE WITH MICROSCOPIC (ARMC ONLY)
BACTERIA UA: NONE SEEN
Bilirubin Urine: NEGATIVE
GLUCOSE, UA: NEGATIVE mg/dL
KETONES UR: NEGATIVE mg/dL
LEUKOCYTES UA: NEGATIVE
NITRITE: NEGATIVE
PROTEIN: NEGATIVE mg/dL
RBC / HPF: NONE SEEN RBC/hpf (ref 0–5)
SPECIFIC GRAVITY, URINE: 1.005 (ref 1.005–1.030)
WBC UA: NONE SEEN WBC/hpf (ref 0–5)
pH: 6 (ref 5.0–8.0)

## 2015-06-17 LAB — TROPONIN I: Troponin I: <0.03 ng/mL (ref <0.031)

## 2015-06-17 MED ORDER — LIDOCAINE 5 % EX PTCH
1.0000 | MEDICATED_PATCH | CUTANEOUS | Status: DC
  Administered 2015-06-17: 1 via TRANSDERMAL
  Filled 2015-06-17 (×2): qty 1

## 2015-06-17 MED ORDER — DIAZEPAM 5 MG PO TABS
5.0000 mg | ORAL_TABLET | Freq: Once | ORAL | Status: AC
  Administered 2015-06-17: 5 mg via ORAL
  Filled 2015-06-17: qty 1

## 2015-06-17 MED ORDER — DIAZEPAM 5 MG PO TABS: 5.0000 mg | ORAL_TABLET | Freq: Three times a day (TID) | ORAL | Status: DC | PRN

## 2015-06-17 MED ORDER — LIDOCAINE 5 % EX PTCH: 1.0000 | MEDICATED_PATCH | CUTANEOUS | Status: DC

## 2015-06-17 MED ORDER — IOHEXOL 350 MG/ML SOLN
75.0000 mL | Freq: Once | INTRAVENOUS | Status: AC | PRN
  Administered 2015-06-17: 75 mL via INTRAVENOUS

## 2015-06-17 MED ORDER — KETOROLAC TROMETHAMINE 30 MG/ML IJ SOLN
30.0000 mg | Freq: Once | INTRAMUSCULAR | Status: AC
  Administered 2015-06-17: 30 mg via INTRAVENOUS
  Filled 2015-06-17: qty 1

## 2015-06-17 NOTE — ED Notes (Signed)
Pt to CT via stretcher

## 2015-06-17 NOTE — ED Notes (Signed)
Pt ret from CT

## 2015-06-17 NOTE — ED Notes (Signed)
Pt uprite on stretcher in exam room with no distress noted; reports lower back pain tonight with no injury, no hx of same, no accomp symptoms; pt also st has had some swelling and pain to left knee since yesterday with no known injury; +periph pulses, W&D with good movem/sensation noted

## 2015-06-17 NOTE — ED Notes (Signed)
MD request add'l attempt at IV start for CT; B Pearline Cables RN attempted twice without success

## 2015-06-17 NOTE — ED Notes (Addendum)
Pt up to room commode for cc urine; stand-by assist only; pt reports increased pain with ambulating; CT tech requiring 20GA IV cath above wrist; MD notified pt has been stuck x 5 with no preferred access able to be obtained

## 2015-06-17 NOTE — ED Notes (Signed)
Attempted IV to right hand twice without success; pt reports EMS also attemped twice en route without success; 2nd nurse called to room for attempt

## 2015-06-17 NOTE — ED Notes (Signed)
PT BROUGHT IN VIA EMS FROM HOME.  PT HAS BACK PAIN.  NO  KNOWN INJURY.   PT ALERT.

## 2015-06-17 NOTE — Discharge Instructions (Signed)
Back Pain, Adult Back pain is very common. The pain often gets better over time. The cause of back pain is usually not dangerous. Most people can learn to manage their back pain on their own.  HOME CARE   Stay active. Start with short walks on flat ground if you can. Try to walk farther each day.  Do not sit, drive, or stand in one place for more than 30 minutes. Do not stay in bed.  Do not avoid exercise or work. Activity can help your back heal faster.  Be careful when you bend or lift an object. Bend at your knees, keep the object close to you, and do not twist.  Sleep on a firm mattress. Lie on your side, and bend your knees. If you lie on your back, put a pillow under your knees.  Only take medicines as told by your doctor.  Put ice on the injured area.  Put ice in a plastic bag.  Place a towel between your skin and the bag.  Leave the ice on for 15-20 minutes, 03-04 times a day for the first 2 to 3 days. After that, you can switch between ice and heat packs.  Ask your doctor about back exercises or massage.  Avoid feeling anxious or stressed. Find good ways to deal with stress, such as exercise. GET HELP RIGHT AWAY IF:   Your pain does not go away with rest or medicine.  Your pain does not go away in 1 week.  You have new problems.  You do not feel well.  The pain spreads into your legs.  You cannot control when you poop (bowel movement) or pee (urinate).  Your arms or legs feel weak or lose feeling (numbness).  You feel sick to your stomach (nauseous) or throw up (vomit).  You have belly (abdominal) pain.  You feel like you may pass out (faint). MAKE SURE YOU:   Understand these instructions.  Will watch your condition.  Will get help right away if you are not doing well or get worse. Document Released: 03/07/2008 Document Revised: 12/12/2011 Document Reviewed: 01/21/2014 [facility] Patient Information 2015 [place], [place]. This information is not intended  to replace advice given to you by your health care provider. Make sure you discuss any questions you have with your health care provider.  Degenerative Disk Disease Degenerative disk disease is a condition caused by the changes that occur in the cushions of the backbone (spinal disks) as you grow older. Spinal disks are soft and compressible disks located between the bones of the spine (vertebrae). They act like shock absorbers. Degenerative disk disease can affect the whole spine. However, the neck and lower back are most commonly affected. Many changes can occur in the spinal disks with aging, such as:  The spinal disks may dry and shrink.  Small tears may occur in the tough, outer covering of the disk (annulus).  The disk space may become smaller due to loss of water.  Abnormal growths in the bone (spurs) may occur. This can put pressure on the nerve roots exiting the spinal canal, causing pain.  The spinal canal may become narrowed. CAUSES  Degenerative disk disease is a condition caused by the changes that occur in the spinal disks with aging. The exact cause is not known, but there is a genetic basis for many patients. Degenerative changes can occur due to loss of fluid in the disk. This makes the disk thinner and reduces the space between the backbones. Small  cracks can develop in the outer layer of the disk. This can lead to the breakdown of the disk. You are more likely to get degenerative disk disease if you are overweight. Smoking cigarettes and doing heavy work such as weightlifting can also increase your risk of this condition. Degenerative changes can start after a sudden injury. Growth of bone spurs can compress the nerve roots and cause pain.  SYMPTOMS  The symptoms vary from person to person. Some people may have no pain, while others have severe pain. The pain may be so severe that it can limit your activities. The location of the pain depends on the part of your backbone that is  affected. You will have neck or arm pain if a disk in the neck area is affected. You will have pain in your back, buttocks, or legs if a disk in the lower back is affected. The pain becomes worse while bending, reaching up, or with twisting movements. The pain may start gradually and then get worse as time passes. It may also start after a major or minor injury. You may feel numbness or tingling in the arms or legs.  DIAGNOSIS  Your caregiver will ask you about your symptoms and about activities or habits that may cause the pain. He or she may also ask about any injuries, diseases, or treatments you have had earlier. Your caregiver will examine you to check for the range of movement that is possible in the affected area, to check for strength in your extremities, and to check for sensation in the areas of the arms and legs supplied by different nerve roots. An X-ray of the spine may be taken. Your caregiver may suggest other imaging tests, such as magnetic resonance imaging (MRI), if needed.  TREATMENT  Treatment includes rest, modifying your activities, and applying ice and heat. Your caregiver may prescribe medicines to reduce your pain and may ask you to do some exercises to strengthen your back. In some cases, you may need surgery. You and your caregiver will decide on the treatment that is best for you. HOME CARE INSTRUCTIONS   Follow proper lifting and walking techniques as advised by your caregiver.  Maintain good posture.  Exercise regularly as advised.  Perform relaxation exercises.  Change your sitting, standing, and sleeping habits as advised. Change positions frequently.  Lose weight as advised.  Stop smoking if you smoke.  Wear supportive footwear. SEEK MEDICAL CARE IF:  Your pain does not go away within 1 to 4 weeks. SEEK IMMEDIATE MEDICAL CARE IF:   Your pain is severe.  You notice weakness in your arms, hands, or legs.  You begin to lose control of your bladder or bowel  movements. MAKE SURE YOU:   Understand these instructions.  Will watch your condition.  Will get help right away if you are not doing well or get worse. Document Released: 07/17/2007 Document Revised: 12/12/2011 Document Reviewed: 01/21/2014 [facility] Patient Information 2015 [place], [place]. This information is not intended to replace advice given to you by your health care provider. Make sure you discuss any questions you have with your health care provider.

## 2015-06-17 NOTE — ED Provider Notes (Signed)
[facility] Emergency Department Provider Note  ____________________________________________  Time seen: Approximately 12:00 AM  I have reviewed the triage vital signs and the nursing notes.   HISTORY  Chief Complaint Back Pain    HPI Sherry Jones is a 74 y.o. female who comes in with back pain. The patient reports the pain is across her back and down into her knee. The patient reports that she's never had problems with this before. The patient reports that she had eye surgery yesterday and a few weeks ago. She reports that she had developed some shoulder pain and was placed on diclofenac by her orthopedic surgeon. The patient reports that she went to her doctor at 720 this morning when she got back home between 1 into the back pain seemed to start. She reports that during the day the pain worsened although the patient had been taking Tylenol. She reports that the pain starts at about her hip level and is across her back and down into her legs. The patient reports that she also noticed some swelling in her left knee. She reports that her back pain hurts to walk and she has some mild discomfort in her knee since yesterday. The patient is in her head. Like this before denies injuring her back in the past. The patient does have some headache but no swelling. The patient came into she couldn't tolerate the pain.The patient rates her pain as a 9 out of 10 in intensity.   Past Medical History  Diagnosis Date  . Diabetes mellitus without complication   . Hypercholesterolemia   . Thyroid disease   . Cancer     uterine    Patient Active Problem List   Diagnosis Date Noted  . Atypical nevus 05/29/2015  . Shoulder pain, left 05/29/2015  . Hypercholesteremia 05/26/2015  . Arthropathy 04/29/2015  . Benign neoplasm of skin 06/04/2014  . 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 H/O ear disorder 06/04/2014   . Cancer of uterus 06/04/2014  . Avitaminosis D 11/20/2009  . Diabetes mellitus, type 2 07/13/2009  . Acquired hypothyroidism 11/26/2007  . Adult hypothyroidism 11/26/2007  . Barrett esophagus 11/23/2007  . Narrowing of intervertebral disc space 11/23/2007  . 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 Acute embolism and thrombosis of deep vein of lower extremity 11/22/2007  . Allergic rhinitis 11/22/2007  . Arthritis, degenerative 11/22/2007  . Disease of airway 11/22/2007    Past Surgical History  Procedure Laterality Date  . Abdominal hysterectomy    . Eye surgery      Current Outpatient Rx  Name  Route  Sig  Dispense  Refill  . diazepam (VALIUM) 5 MG tablet   Oral   Take 1 tablet (5 mg total) by mouth every 8 (eight) hours as needed for anxiety.   30 tablet   0   . Difluprednate (DUREZOL) 0.05 % EMUL   Right Eye   Place 1 drop into the right eye 3 (three) times daily.         Marland Kitchen levothyroxine (SYNTHROID, LEVOTHROID) 75 MCG tablet      TAKE 1 TABLET BY MOUTH EVERY DAY   90 tablet   3   . lidocaine (LIDODERM) 5 %   Transdermal   Place 1 patch onto the skin daily. Remove & Discard patch within 12 hours or as directed by MD   7 patch   0   .  meloxicam (MOBIC) 15 MG tablet      TAKE 1 TABLET BY MOUTH EVERY DAY   90 tablet   3   . nepafenac (ILEVRO) 0.3 % ophthalmic suspension   Right Eye   Place 1 drop into the right eye at bedtime.         . simvastatin (ZOCOR) 10 MG tablet      TAKE 1 TABLET BY MOUTH AT BEDTIME   30 tablet   5   . tobramycin (TOBREX) 0.3 % ophthalmic solution   Right Eye   Place 1 drop into the right eye every 6 (six) hours.         . Vitamin D, Ergocalciferol, (DRISDOL) 50000 UNITS CAPS capsule   Oral   Take 50,000 Units by mouth every 30 (thirty) days.           Allergies Cefadroxil; Cefuroxime; Cephalosporins; Ciprofloxacin; Codeine; Hydrocodone-acetaminophen; Levofloxacin; Meloxicam; Oxycodone; Tramadol;  Methocarbamol; and Nitrofuran derivatives  No family history on file.  Social History Social History  Substance Use Topics  . Smoking status: Former Smoker -- 34 years    Quit date: 01/02/1996  . Smokeless tobacco: Never Used  . Alcohol Use: No    Review of Systems Constitutional: No fever/chills Eyes: No visual changes. ENT: No sore throat. Cardiovascular: Denies chest pain. Respiratory: Denies shortness of breath. Gastrointestinal: No abdominal pain.  No nausea, no vomiting.  No diarrhea.  No constipation. Genitourinary: Negative for dysuria. Musculoskeletal:  back pain. Skin: Negative for rash. Neurological: Headache  10-point ROS otherwise negative.  ____________________________________________   PHYSICAL EXAM:  VITAL SIGNS: ED Triage Vitals  Enc Vitals Group     BP 06/17/15 0006 113/77 mmHg     Pulse Rate 06/17/15 0006 70     Resp 06/17/15 0006 20     Temp 06/17/15 0006 97.9 F (36.6 C)     Temp Source 06/17/15 0006 Oral     SpO2 06/17/15 0006 98 %     Weight 06/17/15 0006 115 lb (52.164 kg)     Height 06/17/15 0006 4\' 6"  (1.372 m)     Head Cir --      Peak Flow --      Pain Score 06/17/15 0007 9     Pain Loc --      Pain Edu? --      Excl. in [place]? --     Constitutional: Alert and oriented. Well appearing and in operate distress. Eyes: Conjunctivae are normal. PERRL. EOMI. Head: Atraumatic. Nose: No congestion/rhinnorhea. Mouth/Throat: Mucous membranes are moist.  Oropharynx non-erythematous. Cardiovascular: Normal rate, regular rhythm. Grossly normal heart sounds.  Good peripheral circulation. Respiratory: Normal respiratory effort.  No retractions. Lungs CTAB. Gastrointestinal: Soft and nontender. No distention. Positive bowel sounds Musculoskeletal: Tender to palpation of low back and bilateral SI joints.   Neurologic:  Normal speech and language. No gross focal neurologic deficits are appreciated.  Skin:  Skin is warm, dry and intact.   Psychiatric: Mood and affect are normal.   ____________________________________________   LABS (all labs ordered are listed, but only abnormal results are displayed)  Labs Reviewed  CBC - Abnormal; Notable for the following:    RBC 3.77 (*)    Hemoglobin 11.9 (*)    HCT 34.2 (*)    All other components within normal limits  COMPREHENSIVE METABOLIC PANEL - Abnormal; Notable for the following:    Potassium 3.2 (*)    Glucose, Bld 158 (*)    Calcium 8.7 (*)  All other components within normal limits  URINALYSIS COMPLETEWITH MICROSCOPIC ([facility] ONLY) - Abnormal; Notable for the following:    Color, Urine STRAW (*)    APPearance CLEAR (*)    Hgb urine dipstick 1+ (*)    Squamous Epithelial / LPF 0-5 (*)    All other components within normal limits  TROPONIN I   ____________________________________________  EKG  None ____________________________________________  RADIOLOGY  Lumbar spine x-ray: Progressive degenerative disc disease at L1-L2 and L2-L3, no acute bony abnormality Knee x-ray: Minimal medial tibiofemoral joint space narrowing consistent with mild degenerative change, no acute bony abnormality CT angiogram: No abdominal aortic aneurysm or dissection, no acute abnormality, degenerative changes in the lumbar spine most significant at L1-L2 ____________________________________________   PROCEDURES  Procedure(s) performed: None  Critical Care performed: No  ____________________________________________   INITIAL IMPRESSION / ASSESSMENT AND PLAN / ED COURSE  Pertinent labs & imaging results that were available during my care of the patient were reviewed by me and considered in my medical decision making (see chart for details).  This is a 74 year old female who comes in with some back pain today. I will check some blood work and give the patient some Valium for her pain. The patient does have a positive straight leg raise on the right but reports her knee hurts  on the left. I will do some x-rays of her back and then decide if we need to do any further imaging.  The patient received Toradol and the Lidoderm patch. The patient's CT scan does not show AAA. She'll be discharged home to follow-up with her primary care physician. ____________________________________________   FINAL CLINICAL IMPRESSION(S) / ED DIAGNOSES  Final diagnoses:  Back pain  Degenerative disc disease, lumbar      Loney Hering, MD 06/17/15 [phone]

## 2015-08-11 ENCOUNTER — Encounter: Payer: Self-pay | Admitting: Family Medicine

## 2015-08-11 ENCOUNTER — Ambulatory Visit (INDEPENDENT_AMBULATORY_CARE_PROVIDER_SITE_OTHER): Payer: PPO | Admitting: Family Medicine

## 2015-08-11 VITALS — BP 128/70 | HR 72 | Temp 98.7°F | Resp 16 | Wt 122.0 lb

## 2015-08-11 DIAGNOSIS — L259 Unspecified contact dermatitis, unspecified cause: Secondary | ICD-10-CM | POA: Diagnosis not present

## 2015-08-11 MED ORDER — TRIAMCINOLONE ACETONIDE 0.1 % EX CREA: 1.0000 "application " | TOPICAL_CREAM | Freq: Two times a day (BID) | CUTANEOUS | Status: DC

## 2015-08-11 NOTE — Progress Notes (Signed)
Subjective:     Patient ID: Sherry Jones, female   DOB: 1941-01-26, 74 y.o.   MRN: [id number]  Rash This is a new problem. The current episode started in the past 7 days (2 days ago). The problem has been gradually worsening (started out as a hard red bump under the skin size of a nickel, gradually changed to red S shape about 5cm in diameter.) since onset. Location: right upper thigh.  The rash is characterized by itchiness, redness and swelling (intense itch bothers her the most). She was exposed to nothing (thought was an insect bite at first, but didn't feel one). Associated symptoms include joint pain. Pertinent negatives include no fever. (None) Past treatments include anti-itch cream and topical steroids (hydrocortisone cream). The treatment provided mild relief. Her past medical history is significant for allergies. There is no history of asthma, eczema or varicella.    Patient Active Problem List   Diagnosis Date Noted  . Atypical nevus 05/29/2015  . Shoulder pain, left 05/29/2015  . Hypercholesteremia 05/26/2015  . Arthropathy 04/29/2015  . Benign neoplasm of skin 06/04/2014  . 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 H/O ear disorder 06/04/2014  . Cancer of uterus ([place]) 06/04/2014  . Avitaminosis D 11/20/2009  . Diabetes mellitus, type 2 ([place]) 07/13/2009  . Acquired hypothyroidism 11/26/2007  . Adult hypothyroidism 11/26/2007  . Barrett esophagus 11/23/2007  . Narrowing of intervertebral disc space 11/23/2007  . 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 Acute embolism and thrombosis of deep vein of lower extremity ([place]) 11/22/2007  . Allergic rhinitis 11/22/2007  . Arthritis, degenerative 11/22/2007  . Disease of airway 11/22/2007   Family History  Problem Relation Age of Onset  . Alzheimer's disease Mother   . Diabetes Brother   . Osteoarthritis Brother    Social History   Social  History  . Marital Status: Married    Spouse Name: N/A  . Number of Children: N/A  . Years of Education: N/A   Occupational History  . Not on file.   Social History Main Topics  . Smoking status: Former Smoker -- 40 years    Quit date: 01/02/1996  . Smokeless tobacco: Never Used  . Alcohol Use: No  . Drug Use: No  . Sexual Activity: Not on file   Other Topics Concern  . Not on file   Social History Narrative   Past Surgical History  Procedure Laterality Date  . Abdominal hysterectomy    . Cataract extraction    . Rotator cuff repair Left    Allergies  Allergen Reactions  . Cefadroxil   . Cefuroxime   . Cephalosporins Other (See Comments)  . Ciprofloxacin Other (See Comments)  . Codeine Swelling  . Hydrocodone-Acetaminophen Other (See Comments)  . Levofloxacin Other (See Comments)  . Macrolides And Ketolides     Other reaction(s): Other (See Comments) Other Reaction: Intolerance  . Meloxicam   . Oxycodone   . Tramadol   . Methocarbamol Rash  . Nitrofuran Derivatives Other (See Comments)    Other Reaction: Intolerance   Previous Medications   DIAZEPAM (VALIUM) 5 MG TABLET    Take 1 tablet (5 mg total) by mouth every 8 (eight) hours as needed for anxiety.   DICLOFENAC (VOLTAREN) 75 MG EC TABLET    TAKE 1 TABLET BY MOUTH TWICE A DAY FOR 14 DAY THEN AS NEEDED FOR PAIN   DIFLUPREDNATE (DUREZOL) 0.05 %  EMUL    Place 1 drop into the right eye 3 (three) times daily.   LEVOTHYROXINE (SYNTHROID, LEVOTHROID) 75 MCG TABLET    TAKE 1 TABLET BY MOUTH EVERY DAY   LIDOCAINE (LIDODERM) 5 %    Place 1 patch onto the skin daily. Remove & Discard patch within 12 hours or as directed by MD   MELOXICAM (MOBIC) 15 MG TABLET    TAKE 1 TABLET BY MOUTH EVERY DAY   NEPAFENAC (ILEVRO) 0.3 % OPHTHALMIC SUSPENSION    Place 1 drop into the right eye at bedtime.   OXYCODONE-ACETAMINOPHEN (PERCOCET/ROXICET) 5-325 MG TABLET    Take 1 tablet by mouth every 6 (six) hours as needed. for pain    SIMVASTATIN (ZOCOR) 10 MG TABLET    TAKE 1 TABLET BY MOUTH AT BEDTIME   TOBRAMYCIN (TOBREX) 0.3 % OPHTHALMIC SOLUTION    Place 1 drop into the right eye every 6 (six) hours.   VITAMIN D, ERGOCALCIFEROL, (DRISDOL) 50000 UNITS CAPS CAPSULE    Take 50,000 Units by mouth every 30 (thirty) days.   BP 128/70 mmHg  Pulse 72  Temp(Src) 98.7 F (37.1 C)  Resp 16  Wt 122 lb (55.339 kg)  SpO2 98%   Review of Systems  Constitutional: Negative for fever, activity change, appetite change and unexpected weight change.       4 lb weight gain over last 6 months from eating  HENT: Negative.   Eyes:       Recently had cataract surgery on both eyes  Respiratory: Negative.   Cardiovascular: Negative.   Gastrointestinal: Negative.   Endocrine: Negative.   Genitourinary: Negative.   Musculoskeletal: Positive for joint pain.       Left knee swelling after trauma, Right elbow pain  Skin: Positive for rash.  Allergic/Immunologic: Negative.   Neurological: Negative.   Hematological: Negative.   Psychiatric/Behavioral: Negative.        Objective:   Physical Exam  Constitutional: She appears well-developed and well-nourished. No distress.  HENT:  Head: Normocephalic and atraumatic.  Nose: Nose normal.  Mouth/Throat: Oropharynx is clear and moist. No oropharyngeal exudate.  Bilateral TM normal  Eyes: EOM are normal. Right eye exhibits no discharge. Left eye exhibits no discharge.  Neck: Normal range of motion. Neck supple. No thyromegaly present.  Cardiovascular: Normal rate and regular rhythm.  Exam reveals no gallop and no friction rub.   No murmur heard. Pulmonary/Chest: Effort normal and breath sounds normal. No stridor. She has no wheezes. She has no rales.  Abdominal: Soft. Bowel sounds are normal. She exhibits no distension and no mass. There is no tenderness.  Musculoskeletal: She exhibits no edema.  Left knee swelling, wrapped in compression material  Lymphadenopathy:    She has no  cervical adenopathy.  Neurological: She is alert. No cranial nerve deficit. She exhibits normal muscle tone. Coordination normal.  Skin: Skin is warm and dry. Rash (right outer thigh, S shaped rash 5cm in diameter with erythema and slightly raised, no drainage, bruising, or scaling ) noted.  Psychiatric: She has a normal mood and affect.       Assessment:     Contact dermatitis    Plan:     1. Contact dermatitis Worsening.  Will treat and call if worsens or does not improve.   - triamcinolone cream (KENALOG) 0.1 %; Apply 1 application topically 2 (two) times daily.  Dispense: 30 g; Refill: 0  Margarita Rana, MD

## 2015-08-15 ENCOUNTER — Encounter: Payer: Self-pay | Admitting: *Deleted

## 2015-08-15 ENCOUNTER — Emergency Department
Admission: EM | Admit: 2015-08-15 | Discharge: 2015-08-15 | Disposition: A | Discharge status: Home / Self Care | Payer: PPO | Attending: Emergency Medicine | Admitting: Emergency Medicine

## 2015-08-15 DIAGNOSIS — Z79899 Other long term (current) drug therapy: Secondary | ICD-10-CM | POA: Insufficient documentation

## 2015-08-15 DIAGNOSIS — T148XXA Other injury of unspecified body region, initial encounter: Secondary | ICD-10-CM

## 2015-08-15 DIAGNOSIS — Y9389 Activity, other specified: Secondary | ICD-10-CM | POA: Insufficient documentation

## 2015-08-15 DIAGNOSIS — Y9289 Other specified places as the place of occurrence of the external cause: Secondary | ICD-10-CM | POA: Insufficient documentation

## 2015-08-15 DIAGNOSIS — Z792 Long term (current) use of antibiotics: Secondary | ICD-10-CM | POA: Diagnosis not present

## 2015-08-15 DIAGNOSIS — S39012A Strain of muscle, fascia and tendon of lower back, initial encounter: Secondary | ICD-10-CM | POA: Diagnosis not present

## 2015-08-15 DIAGNOSIS — E119 Type 2 diabetes mellitus without complications: Secondary | ICD-10-CM | POA: Diagnosis not present

## 2015-08-15 DIAGNOSIS — Z87891 Personal history of nicotine dependence: Secondary | ICD-10-CM | POA: Diagnosis not present

## 2015-08-15 DIAGNOSIS — Y998 Other external cause status: Secondary | ICD-10-CM | POA: Diagnosis not present

## 2015-08-15 DIAGNOSIS — Z791 Long term (current) use of non-steroidal anti-inflammatories (NSAID): Secondary | ICD-10-CM | POA: Diagnosis not present

## 2015-08-15 DIAGNOSIS — M545 Low back pain, unspecified: Secondary | ICD-10-CM

## 2015-08-15 DIAGNOSIS — X501XXA Overexertion from prolonged static or awkward postures, initial encounter: Secondary | ICD-10-CM | POA: Insufficient documentation

## 2015-08-15 DIAGNOSIS — S3992XA Unspecified injury of lower back, initial encounter: Secondary | ICD-10-CM | POA: Diagnosis present

## 2015-08-15 MED ORDER — DIAZEPAM 5 MG PO TABS
5.0000 mg | ORAL_TABLET | Freq: Once | ORAL | Status: AC
  Administered 2015-08-15: 5 mg via ORAL
  Filled 2015-08-15: qty 1

## 2015-08-15 MED ORDER — DIAZEPAM 5 MG PO TABS: 5.0000 mg | ORAL_TABLET | Freq: Three times a day (TID) | ORAL | Status: DC | PRN

## 2015-08-15 NOTE — ED Provider Notes (Addendum)
[facility] Emergency Department Provider Note  ____________________________________________  Time seen: 2:50 AM  I have reviewed the triage vital signs and the nursing notes.   HISTORY  Chief Complaint Back Pain    HPI Sherry Jones is a 74 y.o. female who complains of lower back pain that started this evening. She is had this pain chronically for at least the past 15 years and used to receive epidural and facet block injections through a pain management clinic for this. This evening she was sitting in her lift chair around 8 PM when she started trying to get out of the chair, and immediately developed low back pain. It continued hurting as she try to go to bed and was unable to climb into her bed due to the pain so she sat back in her chair and eventually decided to come to the ER. No numbness tingling or weakness, no bowel or bladder retention or incontinence, no paresthesias. No fever or chills or GI complaints. No dysuria frequency urgency. This does feel like spinal stenosis issues that she's had in the past     Past Medical History  Diagnosis Date  . Diabetes mellitus without complication ([place])   . Hypercholesterolemia   . Thyroid disease   . Cancer [facility])     uterine     Patient Active Problem List   Diagnosis Date Noted  . Contact dermatitis 08/11/2015  . Atypical nevus 05/29/2015  . Shoulder pain, left 05/29/2015  . Hypercholesteremia 05/26/2015  . Arthropathy 04/29/2015  . Benign neoplasm of skin 06/04/2014  . 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 H/O ear disorder 06/04/2014  . Cancer of uterus ([place]) 06/04/2014  . Avitaminosis D 11/20/2009  . Diabetes mellitus, type 2 ([place]) 07/13/2009  . Acquired hypothyroidism 11/26/2007  . Adult hypothyroidism 11/26/2007  . Barrett esophagus 11/23/2007  . Narrowing of intervertebral disc space 11/23/2007  . Lymphoma of lymph nodes of  multiple sites ([place]) 11/23/2007  . Acute embolism and thrombosis of deep vein of lower extremity ([place]) 11/22/2007  . Allergic rhinitis 11/22/2007  . Arthritis, degenerative 11/22/2007  . Disease of airway 11/22/2007     Past Surgical History  Procedure Laterality Date  . Abdominal hysterectomy    . Cataract extraction    . Rotator cuff repair Left      Current Outpatient Rx  Name  Route  Sig  Dispense  Refill  . diazepam (VALIUM) 5 MG tablet   Oral   Take 1 tablet (5 mg total) by mouth every 8 (eight) hours as needed for muscle spasms.   15 tablet   0   . diclofenac (VOLTAREN) 75 MG EC tablet      TAKE 1 TABLET BY MOUTH TWICE A DAY FOR 14 DAY THEN AS NEEDED FOR PAIN      0   . Difluprednate (DUREZOL) 0.05 % EMUL   Right Eye   Place 1 drop into the right eye 3 (three) times daily.         Marland Kitchen levothyroxine (SYNTHROID, LEVOTHROID) 75 MCG tablet      TAKE 1 TABLET BY MOUTH EVERY DAY   90 tablet   3   . nepafenac (ILEVRO) 0.3 % ophthalmic suspension   Right Eye   Place 1 drop into the right eye at bedtime.         Marland Kitchen oxyCODONE-acetaminophen (PERCOCET/ROXICET) 5-325 MG tablet   Oral   Take 1 tablet  by mouth every 6 (six) hours as needed. for pain      0   . simvastatin (ZOCOR) 10 MG tablet      TAKE 1 TABLET BY MOUTH AT BEDTIME   30 tablet   5   . tobramycin (TOBREX) 0.3 % ophthalmic solution   Right Eye   Place 1 drop into the right eye every 6 (six) hours.         . triamcinolone cream (KENALOG) 0.1 %   Topical   Apply 1 application topically 2 (two) times daily.   30 g   0   . Vitamin D, Ergocalciferol, (DRISDOL) 50000 UNITS CAPS capsule   Oral   Take 50,000 Units by mouth every 30 (thirty) days.            Allergies Cefadroxil; Cefuroxime; Cephalosporins; Ciprofloxacin; Codeine; Hydrocodone-acetaminophen; Levofloxacin; Macrolides and ketolides; Meloxicam; Oxycodone; Tramadol; Methocarbamol; and Nitrofuran derivatives   Family History   Problem Relation Age of Onset  . Alzheimer's disease Mother   . Diabetes Brother   . Osteoarthritis Brother     Social History Social History  Substance Use Topics  . Smoking status: Former Smoker -- 28 years    Quit date: 01/02/1996  . Smokeless tobacco: Never Used  . Alcohol Use: No    Review of Systems  Constitutional:   No fever or chills. No weight changes Eyes:   No blurry vision or double vision.  ENT:   No sore throat. Cardiovascular:   No chest pain. Respiratory:   No dyspnea or cough. Gastrointestinal:   Negative for abdominal pain, vomiting and diarrhea.  No BRBPR or melena. Genitourinary:   Negative for dysuria, urinary retention, bloody urine, or difficulty urinating. Musculoskeletal:   Positive low back pain worse with movement. Skin:   Negative for rash. Neurological:   Negative for headaches, focal weakness or numbness. Psychiatric:  No anxiety or depression.   Endocrine:  No hot/cold intolerance, changes in energy, or sleep difficulty.  10-point ROS otherwise negative.  ____________________________________________   PHYSICAL EXAM:  VITAL SIGNS: ED Triage Vitals  Enc Vitals Group     BP 08/15/15 0037 118/49 mmHg     Pulse Rate 08/15/15 0037 77     Resp 08/15/15 0037 18     Temp 08/15/15 0037 98.5 F (36.9 C)     Temp Source 08/15/15 0037 Oral     SpO2 08/15/15 0037 95 %     Weight 08/15/15 0037 122 lb (55.339 kg)     Height 08/15/15 0037 4\' 6"  (1.372 m)     Head Cir --      Peak Flow --      Pain Score 08/15/15 0036 9     Pain Loc --      Pain Edu? --      Excl. in [place]? --      Constitutional:   Alert and oriented. Well appearing and in no distress. Eyes:   No scleral icterus. No conjunctival pallor. PERRL. EOMI ENT   Head:   Normocephalic and atraumatic.   Nose:   No congestion/rhinnorhea. No septal hematoma   Mouth/Throat:   MMM, no pharyngeal erythema. No peritonsillar mass. No uvula shift.   Neck:   No stridor. No SubQ  emphysema. No meningismus. Hematological/Lymphatic/Immunilogical:   No cervical lymphadenopathy. Cardiovascular:   RRR. Normal and symmetric distal pulses are present in all extremities. No murmurs, rubs, or gallops. Respiratory:   Normal respiratory effort without tachypnea nor retractions. Breath sounds are  clear and equal bilaterally. No wheezes/rales/rhonchi. Gastrointestinal:   Soft and nontender. No distention. There is no CVA tenderness.  No rebound, rigidity, or guarding. Genitourinary:   deferred Musculoskeletal:   Diffuse tenderness in the paraspinous soft tissues bilaterally along the lower thoracic and lumbar spines. No focal bony tenderness or step-off. Neurologic:   Normal speech and language.  CN 2-10 normal. Motor grossly intact. No pronator drift.  Gait limited due to pain No gross focal neurologic deficits are appreciated.  Skin:    Skin is warm, dry and intact. No rash noted.  No petechiae, purpura, or bullae. Psychiatric:   Mood and affect are normal. Speech and behavior are normal. Patient exhibits appropriate insight and judgment.  ____________________________________________    LABS (pertinent positives/negatives) (all labs ordered are listed, but only abnormal results are displayed) Labs Reviewed - No data to display ____________________________________________   EKG    ____________________________________________    RADIOLOGY    ____________________________________________   PROCEDURES   ____________________________________________   INITIAL IMPRESSION / ASSESSMENT AND PLAN / ED COURSE  Pertinent labs & imaging results that were available during my care of the patient were reviewed by me and considered in my medical decision making (see chart for details).  Patient presents with acute on chronic back pain. Continue all home medicines which includes NSAIDs, and will start her on Valium as well to help manage his symptoms. I encouraged her to  follow up with neurosurgery or orthopedics regarding the spinal stenosis. She does have an upcoming appointment with orthopedics related to a subacute knee injury. Low suspicion for obstruction or perforation, AAA or dissection, urinary tract infection, volvulus, appendicitis or sepsis.     ____________________________________________   FINAL CLINICAL IMPRESSION(S) / ED DIAGNOSES  Final diagnoses:  Bilateral low back pain without sciatica  Muscle strain      Carrie Mew, MD 08/15/15 [id number]  Carrie Mew, MD 08/15/15 [phone]

## 2015-08-15 NOTE — ED Notes (Addendum)
Mid to lower back pain that started this evening.  Hx of same.  Denies urinary symptoms.  Reports hx of spinal stenosis. Increased pain with movement.

## 2015-08-15 NOTE — Discharge Instructions (Signed)
You were prescribed a medication that is potentially sedating. Do not drink alcohol, drive or participate in any other potentially dangerous activities while taking this medication as it may make you sleepy. Do not take this medication with any other sedating medications, either prescription or over-the-counter. If you were prescribed Percocet or Vicodin, do not take these with acetaminophen (Tylenol) as it is already contained within these medications.   Opioid pain medications (or "narcotics") can be habit forming.  Use it as little as possible to achieve adequate pain control.  Do not use or use it with extreme caution if you have a history of opiate abuse or dependence.  If you are on a pain contract with your primary care doctor or a pain specialist, be sure to let them know you were prescribed this medication today from the [facility] Emergency [facility].  This medication is intended for your use only - do not give any to anyone else and keep it in a secure place where nobody else, especially children and pets, have access to it.  It will also cause or worsen constipation, so you may want to consider taking an over-the-counter stool softener while you are taking this medication.  Back Pain, Adult Back pain is very common in adults.The cause of back pain is rarely dangerous and the pain often gets better over time.The cause of your back pain may not be known. Some common causes of back pain include:  Strain of the muscles or ligaments supporting the spine.  Wear and tear (degeneration) of the spinal disks.  Arthritis.  Direct injury to the back. For many people, back pain may return. Since back pain is rarely dangerous, most people can learn to manage this condition on their own. HOME CARE INSTRUCTIONS Watch your back pain for any changes. The following actions may help to lessen any discomfort you are feeling:  Remain active. It is stressful on your back to sit or stand in one place  for long periods of time. Do not sit, drive, or stand in one place for more than 30 minutes at a time. Take short walks on even surfaces as soon as you are able.Try to increase the length of time you walk each day.  Exercise regularly as directed by your health care provider. Exercise helps your back heal faster. It also helps avoid future injury by keeping your muscles strong and flexible.  Do not stay in bed.Resting more than 1-2 days can delay your recovery.  Pay attention to your body when you bend and lift. The most comfortable positions are those that put less stress on your recovering back. Always use proper lifting techniques, including:  Bending your knees.  Keeping the load close to your body.  Avoiding twisting.  Find a comfortable position to sleep. Use a firm mattress and lie on your side with your knees slightly bent. If you lie on your back, put a pillow under your knees.  Avoid feeling anxious or stressed.Stress increases muscle tension and can worsen back pain.It is important to recognize when you are anxious or stressed and learn ways to manage it, such as with exercise.  Take medicines only as directed by your health care provider. Over-the-counter medicines to reduce pain and inflammation are often the most helpful.Your health care provider may prescribe muscle relaxant drugs.These medicines help dull your pain so you can more quickly return to your normal activities and healthy exercise.  Apply ice to the injured area:  Put ice in a plastic  bag.  Place a towel between your skin and the bag.  Leave the ice on for 20 minutes, 2-3 times a day for the first 2-3 days. After that, ice and heat may be alternated to reduce pain and spasms.  Maintain a healthy weight. Excess weight puts extra stress on your back and makes it difficult to maintain good posture. SEEK MEDICAL CARE IF:  You have pain that is not relieved with rest or medicine.  You have increasing pain  going down into the legs or buttocks.  You have pain that does not improve in one week.  You have night pain.  You lose weight.  You have a fever or chills. SEEK IMMEDIATE MEDICAL CARE IF:   You develop new bowel or bladder control problems.  You have unusual weakness or numbness in your arms or legs.  You develop nausea or vomiting.  You develop abdominal pain.  You feel faint.   This information is not intended to replace advice given to you by your health care provider. Make sure you discuss any questions you have with your health care provider.   Document Released: 09/19/2005 Document Revised: 10/10/2014 Document Reviewed: 01/21/2014 Elsevier Interactive Patient Education [employer].

## 2015-08-18 ENCOUNTER — Other Ambulatory Visit: Payer: Self-pay | Admitting: Family Medicine

## 2015-08-18 DIAGNOSIS — E7800 Pure hypercholesterolemia, unspecified: Secondary | ICD-10-CM

## 2015-08-25 DIAGNOSIS — M25569 Pain in unspecified knee: Secondary | ICD-10-CM | POA: Insufficient documentation

## 2015-09-11 ENCOUNTER — Telehealth: Payer: Self-pay

## 2015-09-11 DIAGNOSIS — M25551 Pain in right hip: Secondary | ICD-10-CM

## 2015-09-11 DIAGNOSIS — M25552 Pain in left hip: Principal | ICD-10-CM

## 2015-09-11 DIAGNOSIS — M549 Dorsalgia, unspecified: Secondary | ICD-10-CM

## 2015-09-11 NOTE — Telephone Encounter (Signed)
Pt reports having increased back and bilateral hip pain.  She would like a referral to Orthopedics.  Contact number [phone].  Thanks,   -Mickel Baas

## 2015-09-24 ENCOUNTER — Other Ambulatory Visit: Payer: Self-pay | Admitting: Physical Medicine and Rehabilitation

## 2015-09-24 DIAGNOSIS — M545 Low back pain: Secondary | ICD-10-CM

## 2015-09-30 ENCOUNTER — Ambulatory Visit
Admission: RE | Admit: 2015-09-30 | Discharge: 2015-09-30 | Disposition: A | Discharge status: Home / Self Care | Payer: PPO | Source: Ambulatory Visit | Attending: Physical Medicine and Rehabilitation | Admitting: Physical Medicine and Rehabilitation

## 2015-09-30 DIAGNOSIS — M545 Low back pain: Secondary | ICD-10-CM

## 2015-10-15 ENCOUNTER — Encounter: Payer: Self-pay | Admitting: Emergency Medicine

## 2015-10-15 ENCOUNTER — Emergency Department
Admission: EM | Admit: 2015-10-15 | Discharge: 2015-10-15 | Disposition: A | Discharge status: Home / Self Care | Payer: PPO | Attending: Emergency Medicine | Admitting: Emergency Medicine

## 2015-10-15 ENCOUNTER — Emergency Department: Payer: PPO

## 2015-10-15 DIAGNOSIS — Z87891 Personal history of nicotine dependence: Secondary | ICD-10-CM | POA: Diagnosis not present

## 2015-10-15 DIAGNOSIS — R0600 Dyspnea, unspecified: Secondary | ICD-10-CM

## 2015-10-15 DIAGNOSIS — R1084 Generalized abdominal pain: Secondary | ICD-10-CM | POA: Diagnosis not present

## 2015-10-15 DIAGNOSIS — R197 Diarrhea, unspecified: Secondary | ICD-10-CM | POA: Insufficient documentation

## 2015-10-15 DIAGNOSIS — Z79899 Other long term (current) drug therapy: Secondary | ICD-10-CM | POA: Insufficient documentation

## 2015-10-15 DIAGNOSIS — E119 Type 2 diabetes mellitus without complications: Secondary | ICD-10-CM | POA: Diagnosis not present

## 2015-10-15 DIAGNOSIS — R0602 Shortness of breath: Secondary | ICD-10-CM | POA: Diagnosis present

## 2015-10-15 DIAGNOSIS — R55 Syncope and collapse: Secondary | ICD-10-CM | POA: Insufficient documentation

## 2015-10-15 LAB — CBC WITH DIFFERENTIAL/PLATELET
Basophils Absolute: 0.1 10*3/uL (ref 0–0.1)
Basophils Relative: 1 %
EOS PCT: 1 %
Eosinophils Absolute: 0.1 10*3/uL (ref 0–0.7)
HCT: 36.4 % (ref 35.0–47.0)
Hemoglobin: 12.5 g/dL (ref 12.0–16.0)
LYMPHS ABS: 1.6 10*3/uL (ref 1.0–3.6)
LYMPHS PCT: 29 %
MCH: 30.1 pg (ref 26.0–34.0)
MCHC: 34.4 g/dL (ref 32.0–36.0)
MCV: 87.7 fL (ref 80.0–100.0)
MONO ABS: 0.4 10*3/uL (ref 0.2–0.9)
Monocytes Relative: 8 %
Neutro Abs: 3.3 10*3/uL (ref 1.4–6.5)
Neutrophils Relative %: 61 %
PLATELETS: 222 10*3/uL (ref 150–440)
RBC: 4.15 MIL/uL (ref 3.80–5.20)
RDW: 15.6 % — AB (ref 11.5–14.5)
WBC: 5.5 10*3/uL (ref 3.6–11.0)

## 2015-10-15 LAB — COMPREHENSIVE METABOLIC PANEL
ALBUMIN: 4 g/dL (ref 3.5–5.0)
ALK PHOS: 50 U/L (ref 38–126)
ALT: 17 U/L (ref 14–54)
AST: 31 U/L (ref 15–41)
Anion gap: 15 (ref 5–15)
BILIRUBIN TOTAL: 1.2 mg/dL (ref 0.3–1.2)
BUN: 13 mg/dL (ref 6–20)
CALCIUM: 9 mg/dL (ref 8.9–10.3)
CO2: 20 mmol/L — AB (ref 22–32)
CREATININE: 0.69 mg/dL (ref 0.44–1.00)
Chloride: 101 mmol/L (ref 101–111)
GFR calc non Af Amer: >60 mL/min (ref >60)
GLUCOSE: 131 mg/dL — AB (ref 65–99)
Potassium: 3.5 mmol/L (ref 3.5–5.1)
SODIUM: 136 mmol/L (ref 135–145)
TOTAL PROTEIN: 6.7 g/dL (ref 6.5–8.1)

## 2015-10-15 LAB — LIPASE, BLOOD: Lipase: 23 U/L (ref 11–51)

## 2015-10-15 LAB — TROPONIN I: Troponin I: <0.03 ng/mL (ref <0.031)

## 2015-10-15 MED ORDER — RANITIDINE HCL 150 MG PO CAPS: 150.0000 mg | ORAL_CAPSULE | Freq: Two times a day (BID) | ORAL | Status: DC

## 2015-10-15 MED ORDER — ONDANSETRON 8 MG PO TBDP: 8.0000 mg | ORAL_TABLET | Freq: Three times a day (TID) | ORAL | Status: DC | PRN

## 2015-10-15 MED ORDER — ONDANSETRON HCL 4 MG/2ML IJ SOLN
INTRAMUSCULAR | Status: AC
  Administered 2015-10-15: 4 mg via INTRAVENOUS
  Filled 2015-10-15: qty 2

## 2015-10-15 MED ORDER — ONDANSETRON HCL 4 MG/2ML IJ SOLN
4.0000 mg | Freq: Once | INTRAMUSCULAR | Status: AC
  Administered 2015-10-15: 4 mg via INTRAVENOUS

## 2015-10-15 MED ORDER — SODIUM CHLORIDE 0.9 % IV BOLUS (SEPSIS)
1000.0000 mL | Freq: Once | INTRAVENOUS | Status: AC
  Administered 2015-10-15: 1000 mL via INTRAVENOUS

## 2015-10-15 NOTE — ED Notes (Signed)
Dr. Stafford at bedside.  

## 2015-10-15 NOTE — Discharge Instructions (Signed)

## 2015-10-15 NOTE — ED Provider Notes (Signed)
[facility] Emergency Department Provider Note  ____________________________________________  Time seen: 10:20 AM  I have reviewed the triage vital signs and the nursing notes.   HISTORY  Chief Complaint Nausea and Shortness of Breath    HPI Sherry Jones is a 75 y.o. female who is in her usual state of health this morning when she was sitting around drinking coffee with her spouse and daughter before having eaten anything when she became dizzy and felt like she needed to have a bowel movement. She went back and forth to the bathroom and had 3 bowel movements in rapid succession. Shortly thereafter she developed some shortness of breath. No chest pain or syncope. She did feel somewhat dizzy and had some nausea but no vomiting. She is starting to feel better on arrival here to the emergency department.     Past Medical History  Diagnosis Date  . Diabetes mellitus without complication ([place])   . Hypercholesterolemia   . Thyroid disease   . Cancer [facility])     uterine     Patient Active Problem List   Diagnosis Date Noted  . Contact dermatitis 08/11/2015  . Atypical nevus 05/29/2015  . Shoulder pain, left 05/29/2015  . Hypercholesteremia 05/26/2015  . Arthropathy 04/29/2015  . Benign neoplasm of skin 06/04/2014  . 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 H/O ear disorder 06/04/2014  . Cancer of uterus ([place]) 06/04/2014  . Avitaminosis D 11/20/2009  . Diabetes mellitus, type 2 ([place]) 07/13/2009  . Acquired hypothyroidism 11/26/2007  . Adult hypothyroidism 11/26/2007  . Barrett esophagus 11/23/2007  . Narrowing of intervertebral disc space 11/23/2007  . 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 Acute embolism and thrombosis of deep vein of lower extremity ([place]) 11/22/2007  . Allergic rhinitis 11/22/2007  . Arthritis, degenerative 11/22/2007  . Disease of airway  11/22/2007     Past Surgical History  Procedure Laterality Date  . Abdominal hysterectomy    . Cataract extraction    . Rotator cuff repair Left      Current Outpatient Rx  Name  Route  Sig  Dispense  Refill  . levothyroxine (SYNTHROID, LEVOTHROID) 75 MCG tablet      TAKE 1 TABLET BY MOUTH EVERY DAY   90 tablet   3   . simvastatin (ZOCOR) 10 MG tablet      TAKE 1 TABLET BY MOUTH AT BEDTIME   90 tablet   1   . Vitamin D, Ergocalciferol, (DRISDOL) 50000 UNITS CAPS capsule   Oral   Take 50,000 Units by mouth every 30 (thirty) days.         . diazepam (VALIUM) 5 MG tablet   Oral   Take 1 tablet (5 mg total) by mouth every 8 (eight) hours as needed for muscle spasms. Patient not taking: Reported on 10/15/2015   15 tablet   0   . ondansetron (ZOFRAN ODT) 8 MG disintegrating tablet   Oral   Take 1 tablet (8 mg total) by mouth every 8 (eight) hours as needed for nausea or vomiting.   20 tablet   0   . ranitidine (ZANTAC) 150 MG capsule   Oral   Take 1 capsule (150 mg total) by mouth 2 (two) times daily.   28 capsule   0   . triamcinolone cream (KENALOG) 0.1 %   Topical   Apply 1 application topically 2 (two) times daily. Patient  not taking: Reported on 10/15/2015   30 g   0      Allergies Cefadroxil; Cefuroxime; Cephalosporins; Ciprofloxacin; Codeine; Hydrocodone-acetaminophen; Levofloxacin; Macrolides and ketolides; Meloxicam; Oxycodone; Tramadol; Methocarbamol; and Nitrofuran derivatives   Family History  Problem Relation Age of Onset  . Alzheimer's disease Mother   . Diabetes Brother   . Osteoarthritis Brother     Social History Social History  Substance Use Topics  . Smoking status: Former Smoker -- 79 years    Quit date: 01/02/1996  . Smokeless tobacco: Never Used  . Alcohol Use: No    Review of Systems  Constitutional:   No fever or chills. No weight changes Eyes:   No blurry vision or double vision.  ENT:   No sore  throat. Cardiovascular:   No chest pain. Respiratory:   Positive shortness of breath. Gastrointestinal:   Positive generalized abdominal cramping and diarrhea..  No BRBPR or melena. Genitourinary:   Negative for dysuria, urinary retention, bloody urine, or difficulty urinating. Musculoskeletal:   Negative for back pain. No joint swelling or pain. Skin:   Negative for rash. Neurological:   Negative for headaches, focal weakness or numbness. Psychiatric:  No anxiety or depression.   Endocrine:  No hot/cold intolerance, changes in energy, or sleep difficulty.  10-point ROS otherwise negative.  ____________________________________________   PHYSICAL EXAM:  VITAL SIGNS: ED Triage Vitals  Enc Vitals Group     BP 10/15/15 1018 130/73 mmHg     Pulse Rate 10/15/15 1018 74     Resp 10/15/15 1018 30     Temp 10/15/15 1018 97.6 F (36.4 C)     Temp Source 10/15/15 1018 Oral     SpO2 10/15/15 1009 100 %     Weight 10/15/15 1018 120 lb (54.432 kg)     Height 10/15/15 1018 4\' 6"  (1.372 m)     Head Cir --      Peak Flow --      Pain Score 10/15/15 1238 0     Pain Loc --      Pain Edu? --      Excl. in [place]? --     Vital signs reviewed, nursing assessments reviewed.   Constitutional:   Alert and oriented. Well appearing and in no distress. Eyes:   No scleral icterus. No conjunctival pallor. PERRL. EOMI ENT   Head:   Normocephalic and atraumatic.   Nose:   No congestion/rhinnorhea. No septal hematoma   Mouth/Throat:   Dry mucous membranes, no pharyngeal erythema. No peritonsillar mass. No uvula shift.   Neck:   No stridor. No SubQ emphysema. No meningismus. Hematological/Lymphatic/Immunilogical:   No cervical lymphadenopathy. Cardiovascular:   RRR. Normal and symmetric distal pulses are present in all extremities. No murmurs, rubs, or gallops. Respiratory:   Normal respiratory effort without tachypnea nor retractions. Breath sounds are clear and equal bilaterally. No  wheezes/rales/rhonchi. Gastrointestinal:   Soft and nontender. No distention. There is no CVA tenderness.  No rebound, rigidity, or guarding. Genitourinary:   deferred Musculoskeletal:   Nontender with normal range of motion in all extremities. No joint effusions.  No lower extremity tenderness.  No edema. Neurologic:   Normal speech and language.  CN 2-10 normal. Motor grossly intact. No pronator drift.  Normal gait. No gross focal neurologic deficits are appreciated.  Skin:    Skin is warm, dry and intact. No rash noted.  No petechiae, purpura, or bullae. Psychiatric:   Mood and affect are normal. Speech and behavior are normal.  Patient exhibits appropriate insight and judgment.  ____________________________________________    LABS (pertinent positives/negatives) (all labs ordered are listed, but only abnormal results are displayed) Labs Reviewed  COMPREHENSIVE METABOLIC PANEL - Abnormal; Notable for the following:    CO2 20 (*)    Glucose, Bld 131 (*)    All other components within normal limits  CBC WITH DIFFERENTIAL/PLATELET - Abnormal; Notable for the following:    RDW 15.6 (*)    All other components within normal limits  LIPASE, BLOOD  TROPONIN I   ____________________________________________   EKG  Interpreted by me  Date: 10/15/2015  Rate: 79  Rhythm: normal sinus rhythm  QRS Axis: normal  Intervals: normal  ST/T Wave abnormalities: normal  Conduction Disutrbances: none  Narrative Interpretation: unremarkable    ____________________________________________    RADIOLOGY  Chest x-ray unremarkable  ____________________________________________   PROCEDURES   ____________________________________________   INITIAL IMPRESSION / ASSESSMENT AND PLAN / ED COURSE  Pertinent labs & imaging results that were available during my care of the patient were reviewed by me and considered in my medical decision making (see chart for details).  Patient presents  with some shortness of breath and abdominal cramping and diarrhea which sounds like a vagal episode that may have been precipitated by gastritis from coffee on an empty stomach. Patient does appear to be a little bit dehydrated. We'll give fluids will checking labs and chest x-ray.  ----------------------------------------- 1:36 PM on 10/15/2015 -----------------------------------------  Workup negative. Low suspicion for ACS PE TAD pneumothorax carditis mediastinitis pneumonia sepsis triple a biliary pathology obstruction perforation pancreatitis or other serious illness. Vital signs are stable and unremarkable. We'll discharge home with symptomatic treatment. Follow-up with primary care.     ____________________________________________   FINAL CLINICAL IMPRESSION(S) / ED DIAGNOSES  Final diagnoses:  Vaso vagal episode  Dyspnea      Carrie Mew, MD 10/15/15 1336

## 2015-10-15 NOTE — ED Notes (Signed)
Per EMS: called out for nausea, shortness of breath en route but denies feeling short of breath.  All started 0730 this am, was drinking coffee with daughter, has not eaten.  Had sudden urge to have bm and became nauseated, had 3 episodes of diarrhea since that time and has become progressively more nauseated but has not vomited. Felt well last night, felt well when she woke up this am.  No sick relatives, has had flu shot this am.  Denies pain.  No tenderness to abdomen or chest pain, abdomen soft, nontender in all quadrants, 22 left forearm.  Given 4 mg of zofran.  zofran has decreased nausea some.

## 2015-10-27 ENCOUNTER — Encounter: Payer: PPO | Admitting: Family Medicine

## 2015-11-06 ENCOUNTER — Encounter: Payer: PPO | Admitting: Family Medicine

## 2015-11-21 ENCOUNTER — Emergency Department
Admission: EM | Admit: 2015-11-21 | Discharge: 2015-11-21 | Disposition: A | Discharge status: Home / Self Care | Payer: Medicare Other | Attending: Emergency Medicine | Admitting: Emergency Medicine

## 2015-11-21 DIAGNOSIS — Y998 Other external cause status: Secondary | ICD-10-CM | POA: Diagnosis not present

## 2015-11-21 DIAGNOSIS — Z87891 Personal history of nicotine dependence: Secondary | ICD-10-CM | POA: Diagnosis not present

## 2015-11-21 DIAGNOSIS — Y281XXA Contact with knife, undetermined intent, initial encounter: Secondary | ICD-10-CM | POA: Insufficient documentation

## 2015-11-21 DIAGNOSIS — Z79899 Other long term (current) drug therapy: Secondary | ICD-10-CM | POA: Insufficient documentation

## 2015-11-21 DIAGNOSIS — S61210A Laceration without foreign body of right index finger without damage to nail, initial encounter: Secondary | ICD-10-CM | POA: Insufficient documentation

## 2015-11-21 DIAGNOSIS — S61219A Laceration without foreign body of unspecified finger without damage to nail, initial encounter: Secondary | ICD-10-CM

## 2015-11-21 DIAGNOSIS — Y9289 Other specified places as the place of occurrence of the external cause: Secondary | ICD-10-CM | POA: Diagnosis not present

## 2015-11-21 DIAGNOSIS — Y9389 Activity, other specified: Secondary | ICD-10-CM | POA: Insufficient documentation

## 2015-11-21 DIAGNOSIS — E119 Type 2 diabetes mellitus without complications: Secondary | ICD-10-CM | POA: Diagnosis not present

## 2015-11-21 MED ORDER — LIDOCAINE HCL (PF) 1 % IJ SOLN
INTRAMUSCULAR | Status: AC
  Administered 2015-11-21: 5 mL via INTRADERMAL
  Filled 2015-11-21: qty 5

## 2015-11-21 MED ORDER — TETANUS-DIPHTH-ACELL PERTUSSIS 5-2.5-18.5 LF-MCG/0.5 IM SUSP
0.5000 mL | Freq: Once | INTRAMUSCULAR | Status: AC
  Administered 2015-11-21: 0.5 mL via INTRAMUSCULAR
  Filled 2015-11-21: qty 0.5

## 2015-11-21 MED ORDER — LIDOCAINE HCL (PF) 1 % IJ SOLN
5.0000 mL | Freq: Once | INTRAMUSCULAR | Status: AC
  Administered 2015-11-21: 5 mL via INTRADERMAL

## 2015-11-21 NOTE — Discharge Instructions (Signed)
Laceration Care, Adult °A laceration is a cut that goes through all layers of the skin. The cut also goes into the tissue that is right under the skin. Some cuts heal on their own. Others need to be closed with stitches (sutures), staples, skin adhesive strips, or wound glue. Taking care of your cut lowers your risk of infection and helps your cut to heal better. °HOW TO TAKE CARE OF YOUR CUT °For stitches or staples: °· Keep the wound clean and dry. °· If you were given a bandage (dressing), you should change it at least one time per day or as told by your doctor. You should also change it if it gets wet or dirty. °· Keep the wound completely dry for the first 24 hours or as told by your doctor. After that time, you may take a shower or a bath. However, make sure that the wound is not soaked in water until after the stitches or staples have been removed. °· Clean the wound one time each day or as told by your doctor: °¨ Wash the wound with soap and water. °¨ Rinse the wound with water until all of the soap comes off. °¨ Pat the wound dry with a clean towel. Do not rub the wound. °· After you clean the wound, put a thin layer of antibiotic ointment on it as told by your doctor. This ointment: °¨ Helps to prevent infection. °¨ Keeps the bandage from sticking to the wound. °· Have your stitches or staples removed as told by your doctor. °If your doctor used skin adhesive strips:  °· Keep the wound clean and dry. °· If you were given a bandage, you should change it at least one time per day or as told by your doctor. You should also change it if it gets dirty or wet. °· Do not get the skin adhesive strips wet. You can take a shower or a bath, but be careful to keep the wound dry. °· If the wound gets wet, pat it dry with a clean towel. Do not rub the wound. °· Skin adhesive strips fall off on their own. You can trim the strips as the wound heals. Do not remove any strips that are still stuck to the wound. They will  fall off after a while. °If your doctor used wound glue: °· Try to keep your wound dry, but you may briefly wet it in the shower or bath. Do not soak the wound in water, such as by swimming. °· After you take a shower or a bath, gently pat the wound dry with a clean towel. Do not rub the wound. °· Do not do any activities that will make you really sweaty until the skin glue has fallen off on its own. °· Do not apply liquid, cream, or ointment medicine to your wound while the skin glue is still on. °· If you were given a bandage, you should change it at least one time per day or as told by your doctor. You should also change it if it gets dirty or wet. °· If a bandage is placed over the wound, do not let the tape for the bandage touch the skin glue. °· Do not pick at the glue. The skin glue usually stays on for 5-10 days. Then, it falls off of the skin. °General Instructions  °· To help prevent scarring, make sure to cover your wound with sunscreen whenever you are outside after stitches are removed, after adhesive strips are removed,   or when wound glue stays in place and the wound is healed. Make sure to wear a sunscreen of at least 30 SPF. °· Take over-the-counter and prescription medicines only as told by your doctor. °· If you were given antibiotic medicine or ointment, take or apply it as told by your doctor. Do not stop using the antibiotic even if your wound is getting better. °· Do not scratch or pick at the wound. °· Keep all follow-up visits as told by your doctor. This is important. °· Check your wound every day for signs of infection. Watch for: °¨ Redness, swelling, or pain. °¨ Fluid, blood, or pus. °· Raise (elevate) the injured area above the level of your heart while you are sitting or lying down, if possible. °GET HELP IF: °· You got a tetanus shot and you have any of these problems at the injection site: °¨ Swelling. °¨ Very bad pain. °¨ Redness. °¨ Bleeding. °· You have a fever. °· A wound that was  closed breaks open. °· You notice a bad smell coming from your wound or your bandage. °· You notice something coming out of the wound, such as wood or glass. °· Medicine does not help your pain. °· You have more redness, swelling, or pain at the site of your wound. °· You have fluid, blood, or pus coming from your wound. °· You notice a change in the color of your skin near your wound. °· You need to change the bandage often because fluid, blood, or pus is coming from the wound. °· You start to have a new rash. °· You start to have numbness around the wound. °GET HELP RIGHT AWAY IF: °· You have very bad swelling around the wound. °· Your pain suddenly gets worse and is very bad. °· You notice painful lumps near the wound or on skin that is anywhere on your body. °· You have a red streak going away from your wound. °· The wound is on your hand or foot and you cannot move a finger or toe like you usually can. °· The wound is on your hand or foot and you notice that your fingers or toes look pale or bluish. °  °This information is not intended to replace advice given to you by your health care provider. Make sure you discuss any questions you have with your health care provider. °  °Document Released: 03/07/2008 Document Revised: 02/03/2015 Document Reviewed: 09/15/2014 °Elsevier Interactive Patient Education ©2016 [employer] ° ° °Keep the wound clean, dry, and covered.  °

## 2015-11-21 NOTE — ED Provider Notes (Signed)
[facility] Emergency Department Provider Note ____________________________________________  Time seen: 2240  I have reviewed the triage vital signs and the nursing notes.  HISTORY  Chief Complaint  Laceration  HPI Sherry Jones is a 75 y.o. female visits to the ED with a laceration to her right index finger that she sustained at home cutting chicken. She describes shopping and the knife just before she used it, and accidentally cut the top of her right index finger. She presents to the ED primarily complaining of uncontrolled bleeding to the right index finger. She denies any other injury, stability, or distal paresthesias.She does not have a current tetanus status.  Past Medical History  Diagnosis Date  . Diabetes mellitus without complication ([place])   . Hypercholesterolemia   . Thyroid disease   . Cancer [facility])     uterine    Patient Active Problem List   Diagnosis Date Noted  . Contact dermatitis 08/11/2015  . Atypical nevus 05/29/2015  . Shoulder pain, left 05/29/2015  . Hypercholesteremia 05/26/2015  . Arthropathy 04/29/2015  . Benign neoplasm of skin 06/04/2014  . 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 H/O ear disorder 06/04/2014  . Cancer of uterus ([place]) 06/04/2014  . Avitaminosis D 11/20/2009  . Diabetes mellitus, type 2 ([place]) 07/13/2009  . Acquired hypothyroidism 11/26/2007  . Adult hypothyroidism 11/26/2007  . Barrett esophagus 11/23/2007  . Narrowing of intervertebral disc space 11/23/2007  . 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 Acute embolism and thrombosis of deep vein of lower extremity ([place]) 11/22/2007  . Allergic rhinitis 11/22/2007  . Arthritis, degenerative 11/22/2007  . Disease of airway 11/22/2007    Past Surgical History  Procedure Laterality Date  . Abdominal hysterectomy    . Cataract extraction    . Rotator cuff repair Left      Current Outpatient Rx  Name  Route  Sig  Dispense  Refill  . diazepam (VALIUM) 5 MG tablet   Oral   Take 1 tablet (5 mg total) by mouth every 8 (eight) hours as needed for muscle spasms. Patient not taking: Reported on 10/15/2015   15 tablet   0   . levothyroxine (SYNTHROID, LEVOTHROID) 75 MCG tablet      TAKE 1 TABLET BY MOUTH EVERY DAY   90 tablet   3   . ondansetron (ZOFRAN ODT) 8 MG disintegrating tablet   Oral   Take 1 tablet (8 mg total) by mouth every 8 (eight) hours as needed for nausea or vomiting.   20 tablet   0   . ranitidine (ZANTAC) 150 MG capsule   Oral   Take 1 capsule (150 mg total) by mouth 2 (two) times daily.   28 capsule   0   . simvastatin (ZOCOR) 10 MG tablet      TAKE 1 TABLET BY MOUTH AT BEDTIME   90 tablet   1   . triamcinolone cream (KENALOG) 0.1 %   Topical   Apply 1 application topically 2 (two) times daily. Patient not taking: Reported on 10/15/2015   30 g   0   . Vitamin D, Ergocalciferol, (DRISDOL) 50000 UNITS CAPS capsule   Oral   Take 50,000 Units by mouth every 30 (thirty) days.          Allergies Cefadroxil; Cefuroxime; Cephalosporins; Ciprofloxacin; Codeine; Hydrocodone-acetaminophen; Levofloxacin; Macrolides and ketolides; Meloxicam; Oxycodone; Tramadol; Methocarbamol; and Nitrofuran derivatives  Family History  Problem Relation Age of Onset  . Alzheimer's disease Mother   . Diabetes Brother   . Osteoarthritis Brother     Social History Social History  Substance Use Topics  . Smoking status: Former Smoker -- 71 years    Quit date: 01/02/1996  . Smokeless tobacco: Never Used  . Alcohol Use: No   Review of Systems  Constitutional: Negative for fever. Eyes: Negative for visual changes. ENT: Negative for sore throat. Cardiovascular: Negative for chest pain. Respiratory: Negative for shortness of breath. Gastrointestinal: Negative for abdominal pain, vomiting and diarrhea. Genitourinary: Negative for  dysuria. Musculoskeletal: Negative for back pain. Skin: Negative for rash. Right index finger lac as above. Neurological: Negative for headaches, focal weakness or numbness. ____________________________________________  PHYSICAL EXAM:  VITAL SIGNS: ED Triage Vitals  Enc Vitals Group     BP 11/21/15 2044 160/63 mmHg     Pulse Rate 11/21/15 2044 86     Resp 11/21/15 2044 18     Temp 11/21/15 2044 98.1 F (36.7 C)     Temp Source 11/21/15 2044 Oral     SpO2 11/21/15 2044 97 %     Weight 11/21/15 2044 115 lb (52.164 kg)     Height 11/21/15 2044 4\' 6"  (1.372 m)     Head Cir --      Peak Flow --      Pain Score 11/21/15 2045 6     Pain Loc --      Pain Edu? --      Excl. in [place]? --    Constitutional: Alert and oriented. Well appearing and in no distress. Head: Normocephalic and atraumatic.      Eyes: Conjunctivae are normal. PERRL. Normal extraocular movements      Ears: Canals clear. TMs intact bilaterally.   Nose: No congestion/rhinorrhea.   Mouth/Throat: Mucous membranes are moist.   Neck: Supple. No thyromegaly. Hematological/Lymphatic/Immunological: No cervical lymphadenopathy. Cardiovascular: Normal rate, regular rhythm.  Respiratory: Normal respiratory effort. No wheezes/rales/rhonchi. Gastrointestinal: Soft and nontender. No distention. Musculoskeletal: Right hand with normal composite fist and normal flexion and extension range of the right index. A linear laceration is noted obliquely across the middle phalanx dorsally. Nontender with normal range of motion in all extremities.  Neurologic: No gross sensation. Normal intrinsic and opposition testing. Normal gait without ataxia. Normal speech and language. No gross focal neurologic deficits are appreciated. Skin:  Skin is warm, dry and intact. No rash noted. Psychiatric: Mood and affect are normal. Patient exhibits appropriate insight and  judgment. ____________________________________________  PROCEDURES  Tdap  LACERATION REPAIR Performed by: Melvenia Needles Authorized by: Melvenia Needles Consent: Verbal consent obtained. Risks and benefits: risks, benefits and alternatives were discussed Consent given by: patient Patient identity confirmed: provided demographic data Prepped and Draped in normal sterile fashion Wound explored  Laceration Location: right index finger  Laceration Length: 2.5cm  No Foreign Bodies seen or palpated  Anesthesia: local infiltration  Local anesthetic: lidocaine 1% w/o epinephrine  Anesthetic total: 1 ml  Irrigation method: syringe Amount of cleaning: standard  Skin closure: 4-0 nylon   Number of sutures: 6  Technique: interrupted  Patient tolerance: Patient tolerated the procedure well with no immediate complications. ____________________________________________  INITIAL IMPRESSION / ASSESSMENT AND PLAN / ED COURSE  Patient with a superficial laceration to the right index finger dorsally. Suture repairs performed and patient is given wound care instruction and follow-up details. She is to seek suture removal with her primary care provider in 10-12  days. ____________________________________________  FINAL CLINICAL IMPRESSION(S) / ED DIAGNOSES  Final diagnoses:  Finger laceration, initial encounter      Melvenia Needles, PA-C 11/21/15 2342  Nance Pear, MD 11/21/15 2348

## 2015-11-21 NOTE — ED Notes (Addendum)
Pt right pointer finger has laceration - Pt was cutting chicken and cut her finger instead - Last tetanus shot was prior to 2000

## 2015-11-21 NOTE — ED Notes (Signed)
Pt with laceration to right index finger on knife; bleeding controlled at this time;

## 2015-12-04 ENCOUNTER — Ambulatory Visit (INDEPENDENT_AMBULATORY_CARE_PROVIDER_SITE_OTHER): Payer: Medicare Other | Admitting: Family Medicine

## 2015-12-04 ENCOUNTER — Encounter: Payer: Self-pay | Admitting: Family Medicine

## 2015-12-04 VITALS — BP 122/60 | HR 66 | Temp 97.8°F | Resp 16 | Wt 120.0 lb

## 2015-12-04 DIAGNOSIS — IMO0002 Reserved for concepts with insufficient information to code with codable children: Secondary | ICD-10-CM

## 2015-12-04 DIAGNOSIS — T148 Other injury of unspecified body region: Secondary | ICD-10-CM

## 2015-12-04 NOTE — Progress Notes (Signed)
Patient ID: Sherry Jones, female   DOB: 05-Aug-1941, 75 y.o.   MRN: [id number]       Patient: Sherry Jones Female    DOB: [birth year]   75 y.o.   MRN: [id number] Visit Date: 12/04/2015  Today's Provider: Margarita Rana, MD   Chief Complaint  Patient presents with  . Follow-up    ER   Subjective:    HPI  Follow up ER visit  Patient was seen in ER for knife injury to her right pointer finger on 11/21/2015. She was treated for laceraction. Treatment for this included stiches (6). She reports excellent compliance with treatment. She reports this condition is Improved. No signs or symptoms of infection.   ------------------------------------------------------------------------------------      Allergies  Allergen Reactions  . Cefadroxil   . Cefuroxime   . Cephalosporins Other (See Comments)  . Ciprofloxacin Other (See Comments)  . Codeine Swelling  . Hydrocodone-Acetaminophen Other (See Comments)  . Levofloxacin Other (See Comments)  . Macrolides And Ketolides     Other reaction(s): Other (See Comments) Other Reaction: Intolerance  . Meloxicam   . Oxycodone   . Tramadol   . Methocarbamol Rash  . Nitrofuran Derivatives Other (See Comments)    Other Reaction: Intolerance   Previous Medications   LEVOTHYROXINE (SYNTHROID, LEVOTHROID) 75 MCG TABLET    TAKE 1 TABLET BY MOUTH EVERY DAY   SIMVASTATIN (ZOCOR) 10 MG TABLET    TAKE 1 TABLET BY MOUTH AT BEDTIME   TRIAMCINOLONE CREAM (KENALOG) 0.1 %    Apply 1 application topically 2 (two) times daily.   VITAMIN D, ERGOCALCIFEROL, (DRISDOL) 50000 UNITS CAPS CAPSULE    Take 50,000 Units by mouth every 30 (thirty) days.    Review of Systems  Constitutional: Negative.   Cardiovascular: Negative.   Skin: Positive for wound.    Social History  Substance Use Topics  . Smoking status: Former Smoker -- 36 years    Quit date: 01/02/1996  . Smokeless tobacco: Never Used  . Alcohol Use: No   Objective:   BP 122/60 mmHg  Pulse  66  Temp(Src) 97.8 F (36.6 C) (Oral)  Resp 16  Wt 120 lb (54.432 kg)  SpO2 97%  Physical Exam  Constitutional: She is oriented to person, place, and time. She appears well-developed and well-nourished.  Musculoskeletal:  Laceration well healed.  Has 6 sutures. Removed in office. Sterile dressing place after. Patient tolerated procedure without any difficulty.    Neurological: She is alert and oriented to person, place, and time.  Psychiatric: She has a normal mood and affect. Her behavior is normal. Judgment and thought content normal.      Assessment & Plan:     1. Skin laceration Sutures removed without difficulty. No signs or symptoms of infection.   Follow up for Wellness as scheduled.      Patient was seen and examined by Jerrell Belfast, MD, and note scribed by Lynford Humphrey, [place].   I have reviewed the document for accuracy and completeness and I agree with above. - Jerrell Belfast, MD   Margarita Rana, MD  [facility]

## 2015-12-28 ENCOUNTER — Encounter: Payer: Self-pay | Admitting: Family Medicine

## 2016-01-20 ENCOUNTER — Ambulatory Visit (INDEPENDENT_AMBULATORY_CARE_PROVIDER_SITE_OTHER): Payer: Medicare Other | Admitting: Family Medicine

## 2016-01-20 ENCOUNTER — Encounter: Payer: Self-pay | Admitting: Family Medicine

## 2016-01-20 VITALS — BP 136/76 | HR 80 | Temp 98.2°F | Resp 16 | Ht <= 58 in | Wt 121.0 lb

## 2016-01-20 DIAGNOSIS — E559 Vitamin D deficiency, unspecified: Secondary | ICD-10-CM

## 2016-01-20 DIAGNOSIS — R7309 Other abnormal glucose: Secondary | ICD-10-CM

## 2016-01-20 DIAGNOSIS — E7800 Pure hypercholesterolemia, unspecified: Secondary | ICD-10-CM | POA: Diagnosis not present

## 2016-01-20 DIAGNOSIS — E039 Hypothyroidism, unspecified: Secondary | ICD-10-CM

## 2016-01-20 DIAGNOSIS — Z0000 Encounter for general adult medical examination without abnormal findings: Secondary | ICD-10-CM | POA: Diagnosis not present

## 2016-01-20 DIAGNOSIS — Z1239 Encounter for other screening for malignant neoplasm of breast: Secondary | ICD-10-CM | POA: Diagnosis not present

## 2016-01-20 DIAGNOSIS — R739 Hyperglycemia, unspecified: Secondary | ICD-10-CM

## 2016-01-20 NOTE — Progress Notes (Signed)
Patient ID: Sherry Jones, female   DOB: 11/02/1940, 76 y.o.   MRN: [id number]       Patient: Sherry Jones, Female    DOB: November 02, 1940, 75 y.o.   MRN: [id number] Visit Date: 01/20/2016  Today's Provider: Margarita Rana, MD   Chief Complaint  Patient presents with  . Medicare Wellness  . Diabetes   Subjective:    Annual wellness visit Sherry Jones is a 75 y.o. female. She feels well. She reports exercising none. She reports she is sleeping well.  09/03/14 AWE 10/06/14 Mammogram-BI-RADS 1 01/03/08 Colonoscopy-internal hemorrhoids, Dr. Allen Norris 09/11/14 BMD-normal -----------------------------------------------------------   Diabetes Mellitus Type II, Follow-up:   Lab Results  Component Value Date   HGBA1C 5.9 09/04/2014    Last seen for diabetes 1 years ago.  Management since then includes no changes. She reports excellent compliance with treatment. She is not having side effects.  Current symptoms include none and have been stable. Home blood sugar records: fasting range: 100's  Episodes of hypoglycemia? no   Current Insulin Regimen: none Most Recent Eye Exam: up to date Weight trend: stable Prior visit with dietician: no Current diet: in general, a "healthy" diet   Current exercise: housecleaning  Pertinent Labs:    Component Value Date/Time   CHOL 224* 09/04/2014   TRIG 129 09/04/2014   HDL 64 09/04/2014   LDLCALC 134 09/04/2014   CREATININE 0.69 10/15/2015 1028   CREATININE 0.83 10/20/2014 1951   CREATININE 0.8 09/04/2014    Wt Readings from Last 3 Encounters:  01/20/16 121 lb (54.885 kg)  12/04/15 120 lb (54.432 kg)  11/21/15 115 lb (52.164 kg)    ------------------------------------------------------------------------     Review of Systems  Constitutional: Negative.   HENT: Negative.   Eyes: Negative.   Respiratory: Negative.   Cardiovascular: Negative.   Gastrointestinal: Negative.   Endocrine: Negative.   Genitourinary: Negative.     Musculoskeletal: Positive for back pain and joint swelling.  Skin: Negative.   Allergic/Immunologic: Negative.   Neurological: Negative.   Hematological: Negative.   Psychiatric/Behavioral: Negative.     Social History   Social History  . Marital Status: Married    Spouse Name: N/A  . Number of Children: N/A  . Years of Education: N/A   Occupational History  . Not on file.   Social History Main Topics  . Smoking status: Former Smoker -- 58 years    Quit date: 01/02/1996  . Smokeless tobacco: Never Used  . Alcohol Use: No  . Drug Use: No  . Sexual Activity: Not on file   Other Topics Concern  . Not on file   Social History Narrative    Past Medical History  Diagnosis Date  . Diabetes mellitus without complication ([place])   . Hypercholesterolemia   . Thyroid disease   . Cancer [facility])     uterine     Patient Active Problem List   Diagnosis Date Noted  . Diabetes mellitus without complication ([place]) [id number]  . Skin laceration 12/04/2015  . Gonalgia 08/25/2015  . Contact dermatitis 08/11/2015  . Atypical nevus 05/29/2015  . Shoulder pain, left 05/29/2015  . Hypercholesteremia 05/26/2015  . Arthropathy 04/29/2015  . Benign neoplasm of skin 06/04/2014  . 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 H/O ear disorder 06/04/2014  . Cancer of uterus ([place]) 06/04/2014  . Malignant neoplasm of uterus ([place]) 06/04/2014  . Avitaminosis D 11/20/2009  . Acquired hypothyroidism 11/26/2007  .  Adult hypothyroidism 11/26/2007  . Barrett esophagus 11/23/2007  . Narrowing of intervertebral disc space 11/23/2007  . 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 Acute embolism and thrombosis of deep vein of lower extremity ([place]) 11/22/2007  . Allergic rhinitis 11/22/2007  . Arthritis, degenerative 11/22/2007  . Disease of airway 11/22/2007   . Thromboembolism of vein 11/22/2007    Past Surgical History  Procedure Laterality Date  . Abdominal hysterectomy    . Cataract extraction    . Rotator cuff repair Left     Her family history includes Alzheimer's disease in her mother; Diabetes in her brother; Osteoarthritis in her brother.    Previous Medications   LEVOTHYROXINE (SYNTHROID, LEVOTHROID) 75 MCG TABLET    TAKE 1 TABLET BY MOUTH EVERY DAY   SIMVASTATIN (ZOCOR) 10 MG TABLET    TAKE 1 TABLET BY MOUTH AT BEDTIME   TRIAMCINOLONE CREAM (KENALOG) 0.1 %    Apply 1 application topically 2 (two) times daily.   VITAMIN D, ERGOCALCIFEROL, (DRISDOL) 50000 UNITS CAPS CAPSULE    Take 50,000 Units by mouth every 30 (thirty) days.    Patient Care Team: Margarita Rana, MD as PCP - General (Family Medicine)     Objective:   Vitals: BP 136/76 mmHg  Pulse 80  Temp(Src) 98.2 F (36.8 C) (Oral)  Resp 16  Ht 4\' 6"  (1.372 m)  Wt 121 lb (54.885 kg)  BMI 29.16 kg/m2  Physical Exam  Constitutional: She is oriented to person, place, and time. She appears well-developed and well-nourished.  HENT:  Head: Normocephalic and atraumatic.  Right Ear: Tympanic membrane, external ear and ear canal normal.  Left Ear: Tympanic membrane, external ear and ear canal normal.  Nose: Nose normal.  Mouth/Throat: Uvula is midline, oropharynx is clear and moist and mucous membranes are normal.  Eyes: Conjunctivae, EOM and lids are normal. Pupils are equal, round, and reactive to light.  Neck: Trachea normal and normal range of motion. Neck supple. Carotid bruit is not present. No thyroid mass and no thyromegaly present.  Cardiovascular: Normal rate, regular rhythm and normal heart sounds.   Pulmonary/Chest: Effort normal and breath sounds normal.  Abdominal: Soft. Normal appearance and bowel sounds are normal. There is no hepatosplenomegaly. There is no tenderness.  Genitourinary: No breast swelling, tenderness or discharge.  Musculoskeletal: Normal  range of motion.  Lymphadenopathy:    She has no cervical adenopathy.    She has no axillary adenopathy.  Neurological: She is alert and oriented to person, place, and time. She has normal strength. No cranial nerve deficit.  Skin: Skin is warm, dry and intact.  Psychiatric: She has a normal mood and affect. Her speech is normal and behavior is normal. Judgment and thought content normal. Cognition and memory are normal.    Activities of Daily Living In your present state of health, do you have any difficulty performing the following activities: 01/20/2016  Hearing? N  Vision? N  Difficulty concentrating or making decisions? N  Walking or climbing stairs? N  Dressing or bathing? N  Doing errands, shopping? N    Fall Risk Assessment Fall Risk  01/20/2016  Falls in the past year? Yes  Number falls in past yr: 1  Injury with Fall? Yes     Depression Screen PHQ 2/9 Scores 01/20/2016  PHQ - 2 Score 0    Cognitive Testing - 6-CIT  Correct? Score   What year  is it? yes 0 0 or 4  What month is it? yes 0 0 or 3  Memorize:    Pia Mau,  43,  [address],  [place],      What time is it? (within 1 hour) yes 0 0 or 3  Count backwards from 20 yes 0 0, 2, or 4  Name the months of the year yes 0 0, 2, or 4  Repeat name & address above no 2 0, 2, 4, 6, 8, or 10       TOTAL SCORE  2/28   Interpretation:  Normal  Normal (0-7) Abnormal (8-28)       Assessment & Plan:     Annual Wellness Visit  Reviewed patient's Family Medical History Reviewed and updated list of patient's medical providers Assessment of cognitive impairment was done Assessed patient's functional ability Established a written schedule for health screening [place] Completed and Reviewed  Exercise Activities and Dietary recommendations Goals    None      Immunization History  Administered Date(s) Administered  . Pneumococcal Conjugate-13 05/14/2014  . Pneumococcal Polysaccharide-23  05/27/2011  . Td 10/03/1997  . Tdap 05/27/2011, 11/21/2015  . Zoster 02/24/2012       1. Medicare annual wellness visit, subsequent As above. Patient advised to continue eating healthy and exercise daily.  2. Abnormal blood sugar Has been so good, will change diagnosis to abnormal blood sugar.   - Comprehensive metabolic panel - Hemoglobin A1c  3. Breast cancer screening - MM DIGITAL SCREENING BILATERAL; Future  4. Acquired hypothyroidism - TSH  5. Hypercholesteremia - CBC with Differential/Platelet - Comprehensive metabolic panel - Lipid Panel With LDL/HDL Ratio  6. Avitaminosis D - VITAMIN D 25 Hydroxy (Vit-D Deficiency, Fractures)     Patient seen and examined by Dr. Jerrell Belfast, and note scribed by Philbert Riser. Dimas, CMA.  I have reviewed the document for accuracy and completeness and I agree with above. Jerrell Belfast, MD   Margarita Rana, MD   ------------------------------------------------------------------------------------------------------------

## 2016-01-22 ENCOUNTER — Ambulatory Visit
Admission: RE | Admit: 2016-01-22 | Discharge: 2016-01-22 | Disposition: A | Discharge status: Home / Self Care | Payer: Medicare Other | Source: Ambulatory Visit | Attending: Family Medicine | Admitting: Family Medicine

## 2016-01-22 DIAGNOSIS — Z1231 Encounter for screening mammogram for malignant neoplasm of breast: Secondary | ICD-10-CM | POA: Diagnosis not present

## 2016-01-22 DIAGNOSIS — Z1239 Encounter for other screening for malignant neoplasm of breast: Secondary | ICD-10-CM

## 2016-01-23 LAB — HEMOGLOBIN A1C
Est. average glucose Bld gHb Est-mCnc: 128 mg/dL
Hgb A1c MFr Bld: 6.1 % — ABNORMAL HIGH (ref 4.8–5.6)

## 2016-01-23 LAB — CBC WITH DIFFERENTIAL/PLATELET
BASOS ABS: 0 10*3/uL (ref 0.0–0.2)
Basos: 1 %
EOS (ABSOLUTE): 0.2 10*3/uL (ref 0.0–0.4)
Eos: 3 %
Hematocrit: 37.6 % (ref 34.0–46.6)
Hemoglobin: 12.9 g/dL (ref 11.1–15.9)
IMMATURE GRANS (ABS): 0 10*3/uL (ref 0.0–0.1)
IMMATURE GRANULOCYTES: 0 %
LYMPHS: 34 %
Lymphocytes Absolute: 1.6 10*3/uL (ref 0.7–3.1)
MCH: 31.5 pg (ref 26.6–33.0)
MCHC: 34.3 g/dL (ref 31.5–35.7)
MCV: 92 fL (ref 79–97)
MONOCYTES: 10 %
Monocytes Absolute: 0.5 10*3/uL (ref 0.1–0.9)
NEUTROS PCT: 52 %
Neutrophils Absolute: 2.5 10*3/uL (ref 1.4–7.0)
PLATELETS: 199 10*3/uL (ref 150–379)
RBC: 4.1 x10E6/uL (ref 3.77–5.28)
RDW: 15.2 % (ref 12.3–15.4)
WBC: 4.8 10*3/uL (ref 3.4–10.8)

## 2016-01-23 LAB — COMPREHENSIVE METABOLIC PANEL
ALT: 15 IU/L (ref 0–32)
AST: 23 IU/L (ref 0–40)
Albumin/Globulin Ratio: 1.7 (ref 1.2–2.2)
Albumin: 4.6 g/dL (ref 3.5–4.8)
Alkaline Phosphatase: 50 IU/L (ref 39–117)
BILIRUBIN TOTAL: 0.5 mg/dL (ref 0.0–1.2)
BUN/Creatinine Ratio: 17 (ref 12–28)
BUN: 12 mg/dL (ref 8–27)
CALCIUM: 9.6 mg/dL (ref 8.7–10.3)
CHLORIDE: 98 mmol/L (ref 96–106)
CO2: 23 mmol/L (ref 18–29)
CREATININE: 0.71 mg/dL (ref 0.57–1.00)
GFR, EST AFRICAN AMERICAN: 97 mL/min/{1.73_m2} (ref >59)
GFR, EST NON AFRICAN AMERICAN: 84 mL/min/{1.73_m2} (ref >59)
GLUCOSE: 112 mg/dL — AB (ref 65–99)
Globulin, Total: 2.7 g/dL (ref 1.5–4.5)
Potassium: 5 mmol/L (ref 3.5–5.2)
Sodium: 144 mmol/L (ref 134–144)
TOTAL PROTEIN: 7.3 g/dL (ref 6.0–8.5)

## 2016-01-23 LAB — LIPID PANEL WITH LDL/HDL RATIO
CHOLESTEROL TOTAL: 188 mg/dL (ref 100–199)
HDL: 67 mg/dL (ref >39)
LDL CALC: 82 mg/dL (ref 0–99)
LDl/HDL Ratio: 1.2 ratio units (ref 0.0–3.2)
TRIGLYCERIDES: 194 mg/dL — AB (ref 0–149)
VLDL Cholesterol Cal: 39 mg/dL (ref 5–40)

## 2016-01-23 LAB — VITAMIN D 25 HYDROXY (VIT D DEFICIENCY, FRACTURES): VIT D 25 HYDROXY: 35.1 ng/mL (ref 30.0–100.0)

## 2016-01-23 LAB — TSH: TSH: 0.577 u[IU]/mL (ref 0.450–4.500)

## 2016-01-25 ENCOUNTER — Telehealth: Payer: Self-pay

## 2016-01-25 NOTE — Telephone Encounter (Signed)
-----   Message from Margarita Rana, MD sent at 01/23/2016  8:54 AM EDT ----- Labs stable. Please notify patient. Thanks.

## 2016-01-25 NOTE — Telephone Encounter (Signed)
LMTCB

## 2016-01-27 NOTE — Telephone Encounter (Signed)
LMTCB 01/27/2016  Thanks,   -Audrionna Lampton  

## 2016-01-27 NOTE — Telephone Encounter (Signed)
Pt advised.   Thanks,   -Goro Wenrick  

## 2016-03-01 ENCOUNTER — Other Ambulatory Visit: Payer: Self-pay | Admitting: Family Medicine

## 2016-03-01 DIAGNOSIS — E7800 Pure hypercholesterolemia, unspecified: Secondary | ICD-10-CM

## 2016-06-13 ENCOUNTER — Other Ambulatory Visit: Payer: Self-pay | Admitting: Family Medicine

## 2016-06-13 DIAGNOSIS — E039 Hypothyroidism, unspecified: Secondary | ICD-10-CM

## 2016-06-25 ENCOUNTER — Other Ambulatory Visit: Payer: Self-pay | Admitting: Family Medicine

## 2016-06-25 DIAGNOSIS — E039 Hypothyroidism, unspecified: Secondary | ICD-10-CM

## 2016-08-03 ENCOUNTER — Encounter: Payer: Self-pay | Admitting: Emergency Medicine

## 2016-08-03 ENCOUNTER — Observation Stay
Admission: EM | Admit: 2016-08-03 | Discharge: 2016-08-04 | Disposition: A | Discharge status: Home / Self Care | Payer: Medicare Other | Attending: Internal Medicine | Admitting: Internal Medicine

## 2016-08-03 ENCOUNTER — Emergency Department: Payer: Medicare Other

## 2016-08-03 DIAGNOSIS — M48061 Spinal stenosis, lumbar region without neurogenic claudication: Secondary | ICD-10-CM | POA: Diagnosis not present

## 2016-08-03 DIAGNOSIS — E039 Hypothyroidism, unspecified: Secondary | ICD-10-CM | POA: Diagnosis present

## 2016-08-03 DIAGNOSIS — M549 Dorsalgia, unspecified: Secondary | ICD-10-CM | POA: Diagnosis present

## 2016-08-03 DIAGNOSIS — M545 Low back pain, unspecified: Secondary | ICD-10-CM

## 2016-08-03 DIAGNOSIS — E7800 Pure hypercholesterolemia, unspecified: Secondary | ICD-10-CM | POA: Diagnosis present

## 2016-08-03 DIAGNOSIS — E119 Type 2 diabetes mellitus without complications: Secondary | ICD-10-CM | POA: Diagnosis not present

## 2016-08-03 DIAGNOSIS — E43 Unspecified severe protein-calorie malnutrition: Secondary | ICD-10-CM | POA: Insufficient documentation

## 2016-08-03 DIAGNOSIS — M1990 Unspecified osteoarthritis, unspecified site: Secondary | ICD-10-CM | POA: Diagnosis not present

## 2016-08-03 DIAGNOSIS — Z87891 Personal history of nicotine dependence: Secondary | ICD-10-CM | POA: Diagnosis not present

## 2016-08-03 DIAGNOSIS — R7303 Prediabetes: Secondary | ICD-10-CM

## 2016-08-03 LAB — CBC WITH DIFFERENTIAL/PLATELET
Basophils Absolute: 0.1 10*3/uL (ref 0–0.1)
Basophils Relative: 1 %
EOS ABS: 0 10*3/uL (ref 0–0.7)
EOS PCT: 0 %
HCT: 31.9 % — ABNORMAL LOW (ref 35.0–47.0)
Hemoglobin: 11.3 g/dL — ABNORMAL LOW (ref 12.0–16.0)
LYMPHS ABS: 1.6 10*3/uL (ref 1.0–3.6)
LYMPHS PCT: 22 %
MCH: 32.1 pg (ref 26.0–34.0)
MCHC: 35.4 g/dL (ref 32.0–36.0)
MCV: 90.8 fL (ref 80.0–100.0)
MONO ABS: 0.7 10*3/uL (ref 0.2–0.9)
MONOS PCT: 10 %
Neutro Abs: 4.8 10*3/uL (ref 1.4–6.5)
Neutrophils Relative %: 67 %
PLATELETS: 167 10*3/uL (ref 150–440)
RBC: 3.51 MIL/uL — AB (ref 3.80–5.20)
RDW: 14.9 % — AB (ref 11.5–14.5)
WBC: 7.2 10*3/uL (ref 3.6–11.0)

## 2016-08-03 LAB — BASIC METABOLIC PANEL
Anion gap: 9 (ref 5–15)
BUN: 14 mg/dL (ref 6–20)
CO2: 25 mmol/L (ref 22–32)
CREATININE: 0.73 mg/dL (ref 0.44–1.00)
Calcium: 8.3 mg/dL — ABNORMAL LOW (ref 8.9–10.3)
Chloride: 100 mmol/L — ABNORMAL LOW (ref 101–111)
GFR calc Af Amer: >60 mL/min (ref >60)
GLUCOSE: 99 mg/dL (ref 65–99)
POTASSIUM: 3.2 mmol/L — AB (ref 3.5–5.1)
SODIUM: 134 mmol/L — AB (ref 135–145)

## 2016-08-03 LAB — GLUCOSE, CAPILLARY: GLUCOSE-CAPILLARY: 135 mg/dL — AB (ref 65–99)

## 2016-08-03 MED ORDER — LIDOCAINE 5 % EX PTCH
1.0000 | MEDICATED_PATCH | CUTANEOUS | Status: DC
  Administered 2016-08-03 – 2016-08-04 (×2): 1 via TRANSDERMAL
  Filled 2016-08-03 (×2): qty 1

## 2016-08-03 MED ORDER — ONDANSETRON HCL 4 MG/2ML IJ SOLN
4.0000 mg | Freq: Once | INTRAMUSCULAR | Status: AC
  Administered 2016-08-03: 4 mg via INTRAVENOUS
  Filled 2016-08-03: qty 2

## 2016-08-03 MED ORDER — KETOROLAC TROMETHAMINE 30 MG/ML IJ SOLN
15.0000 mg | Freq: Once | INTRAMUSCULAR | Status: AC
  Administered 2016-08-03: 15 mg via INTRAVENOUS
  Filled 2016-08-03: qty 1

## 2016-08-03 MED ORDER — LORAZEPAM 2 MG/ML IJ SOLN
1.0000 mg | Freq: Once | INTRAMUSCULAR | Status: AC
  Administered 2016-08-03: 1 mg via INTRAVENOUS
  Filled 2016-08-03: qty 1

## 2016-08-03 MED ORDER — HYDROMORPHONE HCL 1 MG/ML IJ SOLN
1.0000 mg | Freq: Once | INTRAMUSCULAR | Status: AC
  Administered 2016-08-03: 1 mg via INTRAVENOUS
  Filled 2016-08-03: qty 1

## 2016-08-03 NOTE — ED Notes (Signed)
Pt unable to ambulate. 

## 2016-08-03 NOTE — ED Notes (Signed)
Pt attempted to use bedpan, unable to provide urine specimen at this time.

## 2016-08-03 NOTE — ED Triage Notes (Addendum)
Patient presents to the ED via EMS from home for exacerbation of chronic back pain.  EMS gave 28mcg of fentanyl via IV.  Patient reports sitting up on the side of the bed this morning and suddenly had severe lumbar pain.  Patient has history of degenerative disc disease.  Patient reports her husband passed away about 1 week ago.

## 2016-08-03 NOTE — ED Provider Notes (Signed)
Time Seen: Approximately1610  I have reviewed the triage notes  Chief Complaint: Back Pain   History of Present Illness: Sherry Jones is a 75 y.o. female *who presents with nontraumatic low back pain. The patient is recently at work today but still remained active. She apparently was driving last night and taking care of her grandchildren, and etc. Patient woke up this morning and sat upright in the bed and had immediate onset of acute lower midline back discomfort. She denies any radiation of pain into the lower extremities. She denies any leg weakness or difficulty with urination or bowel movements. Patient received IV fentanyl prior to arrival. She states she was able to get up and ambulate after initiation of the pain but that has not been able to do so over the recent past. She has had a history of chronic back pain in the past.  Past Medical History:  Diagnosis Date  . Cancer ([place])    uterine  . Diabetes mellitus without complication ([place])   . Hypercholesterolemia   . Thyroid disease     Patient Active Problem List   Diagnosis Date Noted  . Hyperglycemia 01/20/2016  . Skin laceration 12/04/2015  . Gonalgia 08/25/2015  . Contact dermatitis 08/11/2015  . Atypical nevus 05/29/2015  . Shoulder pain, left 05/29/2015  . Hypercholesteremia 05/26/2015  . Arthropathy 04/29/2015  . Benign neoplasm of skin 06/04/2014  . 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 H/O ear disorder 06/04/2014  . Malignant neoplasm of uterus ([place]) 06/04/2014  . Avitaminosis D 11/20/2009  . Acquired hypothyroidism 11/26/2007  . Barrett esophagus 11/23/2007  . Narrowing of intervertebral disc space 11/23/2007  . 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 Acute embolism and thrombosis of deep vein of lower extremity ([place]) 11/22/2007  . Allergic rhinitis 11/22/2007  . Arthritis, degenerative 11/22/2007  . Disease of  airway 11/22/2007  . Thromboembolism of vein 11/22/2007    Past Surgical History:  Procedure Laterality Date  . ABDOMINAL HYSTERECTOMY    . CATARACT EXTRACTION    . ROTATOR CUFF REPAIR Left     Past Surgical History:  Procedure Laterality Date  . ABDOMINAL HYSTERECTOMY    . CATARACT EXTRACTION    . ROTATOR CUFF REPAIR Left     Current Outpatient Rx  . Order #: [id number] Class: Normal  . Order #: [id number] Class: Normal  . Order #: [id number] Class: Normal  . Order #: [id number] Class: Historical Med    Allergies:  Cefadroxil; Cefuroxime; Cephalosporins; Ciprofloxacin; Codeine; Hydrocodone-acetaminophen; Levofloxacin; Macrolides and ketolides; Meloxicam; Oxycodone; Tramadol; Methocarbamol; and Nitrofuran derivatives  Family History: Family History  Problem Relation Age of Onset  . Alzheimer's disease Mother   . Diabetes Brother   . Osteoarthritis Brother     Social History: Social History  Substance Use Topics  . Smoking status: Former Smoker    Years: 36.00    Quit date: 01/02/1996  . Smokeless tobacco: Never Used  . Alcohol use No     Review of Systems:   10 point review of systems was performed and was otherwise negative:  Constitutional: No fever Eyes: No visual disturbances ENT: No sore throat, ear pain Cardiac: No chest pain Respiratory: No shortness of breath, wheezing, or stridor Abdomen: No abdominal pain, no vomiting, No diarrhea Endocrine: No weight loss, No night sweats Extremities: No peripheral edema, cyanosis Skin: No rashes, easy bruising Neurologic: No focal weakness, trouble with speech or swollowing Urologic: No  dysuria, Hematuria, or urinary frequency   Physical Exam:  ED Triage Vitals  Enc Vitals Group     BP 08/03/16 1604 (!) 155/91     Pulse Rate 08/03/16 1604 94     Resp 08/03/16 1604 18     Temp 08/03/16 1604 99.2 F (37.3 C)     Temp Source 08/03/16 1604 Oral     SpO2 08/03/16 1604 99 %     Weight 08/03/16 1603 109 lb (49.4  kg)     Height 08/03/16 1603 4\' 6"  (1.372 m)     Head Circumference --      Peak Flow --      Pain Score 08/03/16 1603 8     Pain Loc --      Pain Edu? --      Excl. in [place]? --     General: Awake , Alert , and Oriented times 3; GCS 15 Head: Normal cephalic , atraumatic Eyes: Pupils equal , round, reactive to light Nose/Throat: No nasal drainage, patent upper airway without erythema or exudate.  Neck: Supple, Full range of motion, No anterior adenopathy or palpable thyroid masses Lungs: Clear to ascultation without wheezes , rhonchi, or rales Heart: Regular rate, regular rhythm without murmurs , gallops , or rubs Abdomen: Soft, non tender without rebound, guarding , or rigidity; bowel sounds positive and symmetric in all 4 quadrants. No organomegaly .        Extremities: 2 plus symmetric pulses. No edema, clubbing or cyanosis Neurologic: Patient's able to lift both legs off the stretcher though her left leg is chronically has issues and she has pain with lifting the right leg. No sciatica . Babinski downgoing bilaterally. Skin: warm, dry, no rashes   Labs:   All laboratory work was reviewed including any pertinent negatives or positives listed below:  Labs Reviewed  CBC WITH DIFFERENTIAL/PLATELET - Abnormal; Notable for the following:       Result Value   RBC 3.51 (*)    Hemoglobin 11.3 (*)    HCT 31.9 (*)    RDW 14.9 (*)    All other components within normal limits  BASIC METABOLIC PANEL - Abnormal; Notable for the following:    Sodium 134 (*)    Potassium 3.2 (*)    Chloride 100 (*)    Calcium 8.3 (*)    All other components within normal limits  GLUCOSE, CAPILLARY - Abnormal; Notable for the following:    Glucose-Capillary 135 (*)    All other components within normal limits  URINALYSIS COMPLETEWITH MICROSCOPIC [facility] ONLY)    Radiology:  "Dg Lumbar Spine 2-3 Views  Result Date: 08/03/2016 CLINICAL DATA:  Initial evaluation for acute nontraumatic back pain. EXAM:  LUMBAR SPINE - 2-3 VIEW COMPARISON:  Prior MRI from 09/30/2015. FINDINGS: Five non rib-bearing lumbar type vertebral bodies present. Minimal scoliosis. Trace retrolisthesis of L1 on L2. Vertebral bodies otherwise normally aligned with preservation of the normal lumbar lordosis. Vertebral body heights preserved. No acute fracture. Visualized sacrum intact. SI joints approximated. Asymmetric sclerosis about the left SI joint noted. Advanced multilevel degenerative spondylolysis present throughout the lumbar spine, most prevalent at L1-2 and L2-3. No acute soft tissue abnormality. Advanced atheromatous plaque noted within the intra-abdominal aorta. IMPRESSION: 1. No radiographic evidence for acute abnormality within the lumbar spine. 2. Advanced multilevel degenerative spondylolysis, greatest at L1-2 and L2-3. 3. Advanced atheromatous plaque within the intra-abdominal aorta. Electronically Signed   By: Jeannine Boga M.D.   On: 08/03/2016 18:31  "  I personally reviewed the radiologic studies    ED Course:  Patient was given IV muscle relaxant with Ativan along with Toradol, lidocaine patch, and later Dilaudid and Zofran with no symptomatic improvement. She states is still hurts to move and she is afraid to get up and go to the bathroom. Doesn't appear to have any focal neurologic deficits and I thought emergent MRI was not necessary though I have ordered an MRI to happen in the morning. Felt we could try overnight pain control and then perform the MRI tomorrow. She has no reproducible abdominal pain I felt this was unlikely to be from an intra-abdominal source especially with the pain with any movement. She does not appear to have any significant bony, lytic, abnormalities seen on her first plain film x-rays. She has had a history of back pain before in the past and has been seen in the pain clinic, etc. Clinical Course     Assessment:  Musculoskeletal low back pain   Final Clinical  Impression:  Final diagnoses:  Low back pain  Acute midline low back pain without sciatica     Plan: * Admit for observation           Daymon Larsen, MD 08/03/16 2234

## 2016-08-03 NOTE — ED Notes (Signed)
Dr. Quigley in room to assess patient.  Will continue to monitor.   

## 2016-08-04 ENCOUNTER — Observation Stay: Payer: Medicare Other

## 2016-08-04 DIAGNOSIS — M549 Dorsalgia, unspecified: Secondary | ICD-10-CM | POA: Diagnosis present

## 2016-08-04 DIAGNOSIS — E119 Type 2 diabetes mellitus without complications: Secondary | ICD-10-CM

## 2016-08-04 DIAGNOSIS — R7303 Prediabetes: Secondary | ICD-10-CM

## 2016-08-04 DIAGNOSIS — E43 Unspecified severe protein-calorie malnutrition: Secondary | ICD-10-CM | POA: Insufficient documentation

## 2016-08-04 LAB — GLUCOSE, CAPILLARY
GLUCOSE-CAPILLARY: 123 mg/dL — AB (ref 65–99)
GLUCOSE-CAPILLARY: 88 mg/dL (ref 65–99)
Glucose-Capillary: 81 mg/dL (ref 65–99)

## 2016-08-04 LAB — URINALYSIS COMPLETE WITH MICROSCOPIC (ARMC ONLY)
BACTERIA UA: NONE SEEN
BILIRUBIN URINE: NEGATIVE
Glucose, UA: NEGATIVE mg/dL
Leukocytes, UA: NEGATIVE
NITRITE: NEGATIVE
PH: 6 (ref 5.0–8.0)
Protein, ur: 30 mg/dL — AB
Specific Gravity, Urine: 1.018 (ref 1.005–1.030)

## 2016-08-04 MED ORDER — TRAMADOL HCL 50 MG PO TABS: 50.0000 mg | ORAL_TABLET | Freq: Four times a day (QID) | ORAL | Status: DC | PRN

## 2016-08-04 MED ORDER — LEVOTHYROXINE SODIUM 75 MCG PO TABS
75.0000 ug | ORAL_TABLET | Freq: Every day | ORAL | Status: DC
  Administered 2016-08-04: 75 ug via ORAL
  Filled 2016-08-04: qty 1

## 2016-08-04 MED ORDER — HYDROMORPHONE HCL 1 MG/ML IJ SOLN
0.5000 mg | INTRAMUSCULAR | Status: DC | PRN
  Administered 2016-08-04: 0.5 mg via INTRAVENOUS
  Filled 2016-08-04: qty 1

## 2016-08-04 MED ORDER — ONDANSETRON HCL 4 MG/2ML IJ SOLN: 4.0000 mg | Freq: Four times a day (QID) | INTRAMUSCULAR | Status: DC | PRN

## 2016-08-04 MED ORDER — INSULIN ASPART 100 UNIT/ML ~~LOC~~ SOLN: 0.0000 [IU] | Freq: Three times a day (TID) | SUBCUTANEOUS | Status: DC

## 2016-08-04 MED ORDER — LIDOCAINE 5 % EX PTCH: 1.0000 | MEDICATED_PATCH | CUTANEOUS | 0 refills | Status: DC

## 2016-08-04 MED ORDER — ONDANSETRON HCL 4 MG PO TABS: 4.0000 mg | ORAL_TABLET | Freq: Four times a day (QID) | ORAL | Status: DC | PRN

## 2016-08-04 MED ORDER — GLUCERNA SHAKE PO LIQD
237.0000 mL | Freq: Two times a day (BID) | ORAL | Status: DC
  Administered 2016-08-04: 237 mL via ORAL

## 2016-08-04 MED ORDER — KETOROLAC TROMETHAMINE 30 MG/ML IJ SOLN: 15.0000 mg | Freq: Three times a day (TID) | INTRAMUSCULAR | Status: DC | PRN

## 2016-08-04 MED ORDER — ENOXAPARIN SODIUM 40 MG/0.4ML ~~LOC~~ SOLN: 40.0000 mg | SUBCUTANEOUS | Status: DC

## 2016-08-04 MED ORDER — ACETAMINOPHEN 650 MG RE SUPP: 650.0000 mg | Freq: Four times a day (QID) | RECTAL | Status: DC | PRN

## 2016-08-04 MED ORDER — SIMVASTATIN 20 MG PO TABS
10.0000 mg | ORAL_TABLET | Freq: Every day | ORAL | Status: DC
  Administered 2016-08-04: 02:00:00 10 mg via ORAL
  Filled 2016-08-04: qty 1

## 2016-08-04 MED ORDER — ACETAMINOPHEN 325 MG PO TABS
650.0000 mg | ORAL_TABLET | Freq: Four times a day (QID) | ORAL | Status: DC | PRN
  Administered 2016-08-04: 09:00:00 650 mg via ORAL
  Filled 2016-08-04: qty 2

## 2016-08-04 MED ORDER — INSULIN ASPART 100 UNIT/ML ~~LOC~~ SOLN: 0.0000 [IU] | Freq: Every day | SUBCUTANEOUS | Status: DC

## 2016-08-04 MED ORDER — CYCLOBENZAPRINE HCL 10 MG PO TABS: 5.0000 mg | ORAL_TABLET | Freq: Three times a day (TID) | ORAL | Status: DC | PRN

## 2016-08-04 MED ORDER — INFLUENZA VAC SPLIT QUAD 0.5 ML IM SUSY: 0.5000 mL | PREFILLED_SYRINGE | INTRAMUSCULAR | Status: DC

## 2016-08-04 MED ORDER — CYCLOBENZAPRINE HCL 5 MG PO TABS: 5.0000 mg | ORAL_TABLET | Freq: Three times a day (TID) | ORAL | 0 refills | Status: DC | PRN

## 2016-08-04 NOTE — Progress Notes (Addendum)
Initial Nutrition Assessment  DOCUMENTATION CODES:   Severe malnutrition in context of acute illness/injury  INTERVENTION:  -Recommend glucerna BID for added nutrition -Discussed strategies for pt to increase kcals and protein -Pt would benefit from liberalizing diet to regular secondary to poor po intake and weight loss.   NUTRITION DIAGNOSIS:   Malnutrition related to acute illness as evidenced by percent weight loss, energy intake < or equal to 50% for > or equal to 5 days.    GOAL:   Patient will meet greater than or equal to 90% of their needs    MONITOR:   PO intake, Supplement acceptance, Weight trends  REASON FOR ASSESSMENT:   Malnutrition Screening Tool    ASSESSMENT:     75 y.o female admitted with back pain.  Pt with history of uterine cancer, DM, hypercholesterolemia, thyroid disease  Pt reports that her husband was [facility] to [facility] on 2023/08/18 and died on 08/25/2023.  She has not been eating since her husband got sick on 08/18/23, only few bites.    Noted 10% wt loss in the last 2 weeks  Nutrition-Focused physical exam completed. Findings are no fat depletion, no muscle depletion, and no edema.   Medications reviewed: aspart Labs reviewed: Na 134, K 3.2  Diet Order:  Diet Heart Room service appropriate? Yes; Fluid consistency: Thin  Skin:  Reviewed, no issues  Last BM:  11/1  Height:   Ht Readings from Last 1 Encounters:  08/03/16 4\' 6"  (1.372 m)    Weight:   Wt Readings from Last 1 Encounters:  08/03/16 109 lb (49.4 kg)    Ideal Body Weight:     BMI:  Body mass index is 26.28 kg/m.  Estimated Nutritional Needs:   Kcal:  1225-1500 kcals/d  Protein:  59-74 g/d  Fluid:  >/=1263ml/d  EDUCATION NEEDS:   Education needs addressed  Deforrest Bogle B. Zenia Resides, [place], [place] (pager) Weekend/On-Call pager [phone])

## 2016-08-04 NOTE — Evaluation (Signed)
Physical Therapy Evaluation Patient Details Name: SAROJ STANARD MRN: [id number] DOB: [birth year] Today's Date: 08/04/2016   History of Present Illness  Pt here with sudden onset severe low back pain.  She had had lumbar cortisone shots in the past, but reports this to be different than any previous issues.  Pt also had been pursuing L total knee replacement but she put that off when her husband (who died ~1 week ago) became ill.    Clinical Impression  Pt continues to have considerable pain/discomfort but was able to circumambulate the nurses' station (with limp and fatigue) and was guarded with general mobility but ultimately was safe and reports feeling confident going home despite some unsteadiness, continued pain and guarding.  She was able to walk w/o AD, but PT did encourage her to use walker for any longer distances for safety and comfort.     Follow Up Recommendations Home health PT;Outpatient PT (per progress)    Equipment Recommendations       Recommendations for Other Services       Precautions / Restrictions Precautions Precautions: Fall Restrictions Weight Bearing Restrictions: No      Mobility  Bed Mobility Overal bed mobility: Modified Independent             General bed mobility comments: Pt guarded getting back into bed, but able to do so w/o direct assist  Transfers Overall transfer level: Independent Equipment used: None             General transfer comment: Pt with light guarding getting to standing, but able to do so w/o direct assist  Ambulation/Gait Ambulation/Gait assistance: Min guard Ambulation Distance (Feet): 200 Feet Assistive device: None       General Gait Details: Pt  had limp on L LE (secondary to severe knee OA) and does report that her knee has been more and more of an issue recently.  Overall pt was safe, but no where near her baseline with cadence, speed, etc.  Encouraged her to use a walker to take pressure off back and knee  until acute pain resolves.   Stairs            Wheelchair Mobility    Modified Rankin (Stroke Patients Only)       Balance Overall balance assessment: Modified Independent                                           Pertinent Vitals/Pain Pain Assessment: 0-10 Pain Score: 7  (reports pain is much less than on arrival) Pain Location: lumbar sacral junction bilaterally    Home Living Family/patient expects to be discharged to:: Private residence Living Arrangements: Alone Available Help at Discharge: Family   Home Access: Ramped entrance       Home Equipment: Walker - 2 wheels      Prior Function Level of Independence: Independent         Comments: Pt typically is very active and independent.      Hand Dominance        Extremity/Trunk Assessment   Upper Extremity Assessment: Overall WFL for tasks assessed (minimally limited secondary to lumbar pain)           Lower Extremity Assessment: Overall WFL for tasks assessed (b/l hips grossly 3+/5, otherwise 4/5 or better t/o)         Communication   Communication: No difficulties  Cognition Arousal/Alertness: Awake/alert Behavior During Therapy: WFL for tasks assessed/performed Overall Cognitive Status: Within Functional Limits for tasks assessed                      General Comments      Exercises     Assessment/Plan    PT Assessment Patient needs continued PT services  PT Problem List Decreased strength;Pain;Decreased activity tolerance;Decreased balance;Decreased mobility;Decreased knowledge of use of DME          PT Treatment Interventions DME instruction;Gait training;Functional mobility training;Therapeutic activities;Therapeutic exercise;Neuromuscular re-education;Patient/family education    PT Goals (Current goals can be found in the Care Plan section)  Acute Rehab PT Goals Patient Stated Goal: go home PT Goal Formulation: With patient Time For Goal  Achievement: 08/18/16 Potential to Achieve Goals: Good    Frequency Min 2X/week   Barriers to discharge        Co-evaluation               End of Session Equipment Utilized During Treatment: Gait belt Activity Tolerance: Patient tolerated treatment well Patient left: with call bell/phone within reach;with bed alarm set      Functional Assessment Tool Used: clinical judgement Functional Limitation: Mobility: Walking and moving around Mobility: Walking and Moving Around Current Status [id number]): At least 20 percent but less than 40 percent impaired, limited or restricted Mobility: Walking and Moving Around Goal Status [phone]): At least 1 percent but less than 20 percent impaired, limited or restricted    Time: [id number] PT Time Calculation (min) (ACUTE ONLY): 23 min   Charges:   PT Evaluation $PT Eval Low Complexity: 1 Procedure     PT G Codes:   PT G-Codes **NOT FOR INPATIENT CLASS** Functional Assessment Tool Used: clinical judgement Functional Limitation: Mobility: Walking and moving around Mobility: Walking and Moving Around Current Status [id number]): At least 20 percent but less than 40 percent impaired, limited or restricted Mobility: Walking and Moving Around Goal Status [phone]): At least 1 percent but less than 20 percent impaired, limited or restricted    Kreg Shropshire, DPT 08/04/2016, 4:11 PM

## 2016-08-04 NOTE — Care Management Obs Status (Signed)
[place] NOTIFICATION   Patient Details  Name: Sherry Jones MRN: [id number] Date of Birth: [birth year]   Medicare Observation Status Notification Given:  Yes    Beau Fanny, RN 08/04/2016, 9:10 AM

## 2016-08-04 NOTE — Care Management (Addendum)
Admitted to [facility] with the diagnosis of back pain under observation status. Lives alone. Did see Dr. Venia Minks, planning on following up with a physician at this practice. [employer] in the home. Cane and rolling walker in the home. [facility] in the past for services. No skilled facility. No home oxygen. Good appetite. Almost fell, slid against the bed. Takes care of all basic and instrumental activities of daily living herself, drives. Daughter will transport. Sherry Jones [phone]).  Physical therapy evaluation completed. Recommends home with home health/therapy. Mathews, Floydene Flock Advanced representative updated. Shelbie Ammons RN MSN CCM Care Management [phone]

## 2016-08-04 NOTE — Plan of Care (Signed)
Problem: Pain Managment: Goal: General experience of comfort will improve Outcome: Progressing Pain med x 1

## 2016-08-04 NOTE — Discharge Summary (Signed)
[place] [facility] [place] NAME: Sherry Jones    MR#:  [id number]  DATE OF BIRTH:  Jun 18, 1941  DATE OF ADMISSION:  08/03/2016 ADMITTING PHYSICIAN: Lance Coon, MD  DATE OF DISCHARGE: 08/04/16  PRIMARY CARE PHYSICIAN: Mar Daring, PA-C    ADMISSION DIAGNOSIS:  Low back pain [M54.5] Acute midline low back pain without sciatica [M54.5]  DISCHARGE DIAGNOSIS:  Acute Low back pain DJD with Lumbar spine stenosis  SECONDARY DIAGNOSIS:   Past Medical History:  Diagnosis Date  . Cancer ([place])    uterine  . Diabetes mellitus without complication ([place])   . Hypercholesterolemia   . Thyroid disease     HOSPITAL COURSE:   Sherry Jones  is a 75 y.o. female who presents with Acute onset mid back pain. She states that this pain started as she got up out of bed, felt like "2 knives stabbing around her spine." She has no associated neurological deficits  1. Acute Back pain - analgesia in place when necessary -CT lumbar spine showing DJ with lumbar stenosis -prn flexeril and lidocaine patch -HHPT is recommended  2.Diabetes ([place]) - sliding scale insulin with corresponding glucose checks  3. Hypercholesteremia - continue home meds  4.  Acquired hypothyroidism - home dose thyroid replacement  Overall improving D/c home Pt agreeable   CONSULTS OBTAINED:    DRUG ALLERGIES:   Allergies  Allergen Reactions  . Cefadroxil   . Cefuroxime   . Cephalosporins Other (See Comments)  . Ciprofloxacin Other (See Comments)  . Codeine Swelling  . Gabapentin Nausea And Vomiting  . Hydrocodone-Acetaminophen Other (See Comments)  . Levofloxacin Other (See Comments)  . Macrolides And Ketolides     Other reaction(s): Other (See Comments) Other Reaction: Intolerance  . Meloxicam   . Oxycodone   . Tramadol   . Methocarbamol Rash  . Nitrofuran Derivatives Other (See Comments)    Other Reaction: Intolerance    DISCHARGE MEDICATIONS:    Current Discharge Medication List    START taking these medications   Details  cyclobenzaprine (FLEXERIL) 5 MG tablet Take 1 tablet (5 mg total) by mouth 3 (three) times daily as needed for muscle spasms. Qty: 30 tablet, Refills: 0    lidocaine (LIDODERM) 5 % Place 1 patch onto the skin daily. Remove & Discard patch within 12 hours or as directed by MD Qty: 25 patch, Refills: 0      CONTINUE these medications which have NOT CHANGED   Details  levothyroxine (SYNTHROID, LEVOTHROID) 75 MCG tablet TAKE 1 TABLET BY MOUTH EVERY DAY Qty: 90 tablet, Refills: 1   Associated Diagnoses: Acquired hypothyroidism    simvastatin (ZOCOR) 10 MG tablet TAKE 1 TABLET BY MOUTH AT BEDTIME Qty: 90 tablet, Refills: 1   Associated Diagnoses: Hypercholesteremia    Vitamin D, Ergocalciferol, (DRISDOL) 50000 UNITS CAPS capsule Take 50,000 Units by mouth every 30 (thirty) days.        If you experience worsening of your admission symptoms, develop shortness of breath, life threatening emergency, suicidal or homicidal thoughts you must seek medical attention immediately by calling 911 or calling your MD immediately  if symptoms less severe.  You Must read complete instructions/literature along with all the possible adverse reactions/side effects for all the Medicines you take and that have been prescribed to you. Take any new Medicines after you have completely understood and accept all the possible adverse reactions/side effects.   Please note  You were cared for by a hospitalist  during your hospital stay. If you have any questions about your discharge medications or the care you received while you were in the hospital after you are discharged, you can call the unit and asked to speak with the hospitalist on call if the hospitalist that took care of you is not available. Once you are discharged, your primary care physician will handle any further medical issues. Please note that NO REFILLS for any discharge  medications will be authorized once you are discharged, as it is imperative that you return to your primary care physician (or establish a relationship with a primary care physician if you do not have one) for your aftercare needs so that they can reassess your need for medications and monitor your lab values. Today   SUBJECTIVE   Came in with back pain. No urinary and bowel incontinence  VITAL SIGNS:  Blood pressure (!) 119/45, pulse 69, temperature 98.5 F (36.9 C), temperature source Oral, resp. rate 18, height 4\' 6"  (1.372 m), weight 49.4 kg (109 lb), SpO2 96 %.  I/O:   Intake/Output Summary (Last 24 hours) at 08/04/16 1423 Last data filed at 08/04/16 1231  Gross per 24 hour  Intake              360 ml  Output              300 ml  Net               60 ml    PHYSICAL EXAMINATION:  GENERAL:  75 y.o.-year-old patient lying in the bed with no acute distress.  EYES: Pupils equal, round, reactive to light and accommodation. No scleral icterus. Extraocular muscles intact.  HEENT: Head atraumatic, normocephalic. Oropharynx and nasopharynx clear.  NECK:  Supple, no jugular venous distention. No thyroid enlargement, no tenderness.  LUNGS: Normal breath sounds bilaterally, no wheezing, rales,rhonchi or crepitation. No use of accessory muscles of respiration.  CARDIOVASCULAR: S1, S2 normal. No murmurs, rubs, or gallops.  ABDOMEN: Soft, non-tender, non-distended. Bowel sounds present. No organomegaly or mass.  EXTREMITIES: No pedal edema, cyanosis, or clubbing.  NEUROLOGIC: Cranial nerves II through XII are intact. Muscle strength 5/5 in all extremities. Sensation intact. Gait not checked.  PSYCHIATRIC: The patient is alert and oriented x 3.  SKIN: No obvious rash, lesion, or ulcer.   DATA REVIEW:   CBC   Recent Labs Lab 08/03/16 2009  WBC 7.2  HGB 11.3*  HCT 31.9*  PLT 167    Chemistries   Recent Labs Lab 08/03/16 2009  NA 134*  K 3.2*  CL 100*  CO2 25  GLUCOSE 99   BUN 14  CREATININE 0.73  CALCIUM 8.3*    Microbiology Results   No results found for this or any previous visit (from the past 240 hour(s)).  RADIOLOGY:  Dg Lumbar Spine 2-3 Views  Result Date: 08/03/2016 CLINICAL DATA:  Initial evaluation for acute nontraumatic back pain. EXAM: LUMBAR SPINE - 2-3 VIEW COMPARISON:  Prior MRI from 09/30/2015. FINDINGS: Five non rib-bearing lumbar type vertebral bodies present. Minimal scoliosis. Trace retrolisthesis of L1 on L2. Vertebral bodies otherwise normally aligned with preservation of the normal lumbar lordosis. Vertebral body heights preserved. No acute fracture. Visualized sacrum intact. SI joints approximated. Asymmetric sclerosis about the left SI joint noted. Advanced multilevel degenerative spondylolysis present throughout the lumbar spine, most prevalent at L1-2 and L2-3. No acute soft tissue abnormality. Advanced atheromatous plaque noted within the intra-abdominal aorta. IMPRESSION: 1. No radiographic evidence for acute abnormality  within the lumbar spine. 2. Advanced multilevel degenerative spondylolysis, greatest at L1-2 and L2-3. 3. Advanced atheromatous plaque within the intra-abdominal aorta. Electronically Signed   By: Jeannine Boga M.D.   On: 08/03/2016 18:31   Ct Lumbar Spine Wo Contrast  Result Date: 08/04/2016 CLINICAL DATA:  Back pain EXAM: CT LUMBAR SPINE WITHOUT CONTRAST TECHNIQUE: Multidetector CT imaging of the lumbar spine was performed without intravenous contrast administration. Multiplanar CT image reconstructions were also generated. COMPARISON:  Lumbar MRI 09/22/2015.  Lumbar spine x-rays 08/03/2016 FINDINGS: Segmentation: Normal Alignment: Retrolisthesis L1-2 and L2-3. Anterior listhesis L4-5. Mild lumbar levoscoliosis Vertebrae: Discogenic changes are present in the lumbar spine. No fracture or mass. Paraspinal and other soft tissues: Atherosclerotic aorta without aneurysm. No retroperitoneal mass. Disc levels: T12-L1:  Disc degeneration with disc space narrowing. Negative for stenosis L1-2: Advanced disc degeneration with disc space narrowing and endplate sclerosis. Extensive posterior osteophyte. Discogenic sclerosis in the endplates. Bilateral facet degeneration. Moderate spinal stenosis. Moderate foraminal stenosis bilaterally. L2-3: Disc degeneration with endplate spurring. Mild retrolisthesis. Bilateral facet hypertrophy. Moderate spinal stenosis. Mild right foraminal narrowing. L3-4: Mild anterolisthesis. Disc degeneration with diffuse disc bulging and endplate spurring. Moderate facet hypertrophy bilaterally. Moderate spinal stenosis. Neural foramina adequately patent. L4-5: 3 mm anterior listhesis. Diffuse disc bulging. Severe facet degeneration. Moderate spinal stenosis unchanged from the prior study. Neural foramina adequately patent L5-S1: Disc and facet degeneration. No significant spinal or foraminal stenosis. IMPRESSION: Lumbar disc and facet degeneration at multiple levels causing spinal and foraminal stenosis. Findings similar to the prior MRI of 09/30/2015. No fracture or superimposed acute abnormalities since the prior study Electronically Signed   By: Franchot Gallo M.D.   On: 08/04/2016 14:14     Management plans discussed with the patient, family and they are in agreement.  CODE STATUS:     Code Status Orders        Start     Ordered   08/04/16 0148  Full code  Continuous     08/04/16 0148    Code Status History    Date Active Date Inactive Code Status Order ID Comments User Context   This patient has a current code status but no historical code status.    Advance Directive Documentation   Flowsheet Row Most Recent Value  Type of Advance Directive  Healthcare Power of Attorney  Pre-existing out of facility DNR order (yellow form or pink MOST form)  No data  "MOST" Form in Place?  No data      TOTAL TIME TAKING CARE OF THIS PATIENT: 40 minutes.    Deyanna Mctier M.D on 08/04/2016 at  2:23 PM  Between 7am to 6pm - Pager - [phone] After 6pm go to [email or web address] - password [email or web address] [facility] [place]  Office  [phone]  CC: Primary care physician; Mar Daring, PA-C

## 2016-08-04 NOTE — H&P (Signed)
[facility] at [place] NAME: Sherry Jones    MR#:  [id number]  DATE OF BIRTH:  1941/06/12  DATE OF ADMISSION:  08/03/2016  PRIMARY CARE PHYSICIAN: Mar Daring, PA-C   REQUESTING/REFERRING PHYSICIAN: Marcelene Butte, MD  CHIEF COMPLAINT:   Chief Complaint  Patient presents with  . Back Pain    HISTORY OF PRESENT ILLNESS:  Sherry Jones  is a 76 y.o. female who presents with Acute onset mid back pain. She states that this pain started as she got up out of bed, felt like "2 knives stabbing around her spine." She has no associated neurological deficits. MRI ordered in the ED and hospitalist called for admission for observation.  PAST MEDICAL HISTORY:   Past Medical History:  Diagnosis Date  . Cancer ([place])    uterine  . Diabetes mellitus without complication ([place])   . Hypercholesterolemia   . Thyroid disease     PAST SURGICAL HISTORY:   Past Surgical History:  Procedure Laterality Date  . ABDOMINAL HYSTERECTOMY    . CATARACT EXTRACTION    . ROTATOR CUFF REPAIR Left     SOCIAL HISTORY:   Social History  Substance Use Topics  . Smoking status: Former Smoker    Years: 36.00    Quit date: 01/02/1996  . Smokeless tobacco: Never Used  . Alcohol use No    FAMILY HISTORY:   Family History  Problem Relation Age of Onset  . Alzheimer's disease Mother   . Diabetes Brother   . Osteoarthritis Brother     DRUG ALLERGIES:   Allergies  Allergen Reactions  . Cefadroxil   . Cefuroxime   . Cephalosporins Other (See Comments)  . Ciprofloxacin Other (See Comments)  . Codeine Swelling  . Gabapentin Nausea And Vomiting  . Hydrocodone-Acetaminophen Other (See Comments)  . Levofloxacin Other (See Comments)  . Macrolides And Ketolides     Other reaction(s): Other (See Comments) Other Reaction: Intolerance  . Meloxicam   . Oxycodone   . Tramadol   . Methocarbamol Rash  . Nitrofuran Derivatives Other (See Comments)    Other  Reaction: Intolerance    MEDICATIONS AT HOME:   Prior to Admission medications   Medication Sig Start Date End Date Taking? Authorizing Provider  levothyroxine (SYNTHROID, LEVOTHROID) 75 MCG tablet TAKE 1 TABLET BY MOUTH EVERY DAY 06/27/16  Yes Clearnce Sorrel Burnette, PA-C  simvastatin (ZOCOR) 10 MG tablet TAKE 1 TABLET BY MOUTH AT BEDTIME 03/01/16  Yes Margarita Rana, MD  Vitamin D, Ergocalciferol, (DRISDOL) 50000 UNITS CAPS capsule Take 50,000 Units by mouth every 30 (thirty) days.   Yes Historical Provider, MD    REVIEW OF SYSTEMS:  Review of Systems  Constitutional: Negative for chills, fever, malaise/fatigue and weight loss.  HENT: Negative for ear pain, hearing loss and tinnitus.   Eyes: Negative for blurred vision, double vision, pain and redness.  Respiratory: Negative for cough, hemoptysis and shortness of breath.   Cardiovascular: Negative for chest pain, palpitations, orthopnea and leg swelling.  Gastrointestinal: Negative for abdominal pain, constipation, diarrhea, nausea and vomiting.  Genitourinary: Negative for dysuria, frequency and hematuria.  Musculoskeletal: Positive for back pain. Negative for joint pain and neck pain.  Skin:       No acne, rash, or lesions  Neurological: Negative for dizziness, tremors, focal weakness and weakness.  Endo/Heme/Allergies: Negative for polydipsia. Does not bruise/bleed easily.  Psychiatric/Behavioral: Negative for depression. The patient is not nervous/anxious and does not have insomnia.  VITAL SIGNS:   Vitals:   08/03/16 2251 08/03/16 2252 08/03/16 2300 08/03/16 2330  BP:   122/63 (!) 113/54  Pulse:   66 66  Resp:   10 10  Temp:    97.9 F (36.6 C)  TempSrc:    Oral  SpO2: (!) 88% 98% 97% 98%  Weight:      Height:       Wt Readings from Last 3 Encounters:  08/03/16 49.4 kg (109 lb)  01/20/16 54.9 kg (121 lb)  12/04/15 54.4 kg (120 lb)    PHYSICAL EXAMINATION:  Physical Exam  Vitals reviewed. Constitutional: She is  oriented to person, place, and time. She appears well-developed and well-nourished. No distress.  HENT:  Head: Normocephalic and atraumatic.  Mouth/Throat: Oropharynx is clear and moist.  Eyes: Conjunctivae and EOM are normal. Pupils are equal, round, and reactive to light. No scleral icterus.  Neck: Normal range of motion. Neck supple. No JVD present. No thyromegaly present.  Cardiovascular: Normal rate, regular rhythm and intact distal pulses.  Exam reveals no gallop and no friction rub.   No murmur heard. Respiratory: Effort normal and breath sounds normal. No respiratory distress. She has no wheezes. She has no rales.  GI: Soft. Bowel sounds are normal. She exhibits no distension. There is no tenderness.  Musculoskeletal: Normal range of motion. She exhibits tenderness (back). She exhibits no edema.  No arthritis, no gout  Lymphadenopathy:    She has no cervical adenopathy.  Neurological: She is alert and oriented to person, place, and time. No cranial nerve deficit.  No dysarthria, no aphasia  Skin: Skin is warm and dry. No rash noted. No erythema.  Psychiatric: She has a normal mood and affect. Her behavior is normal. Judgment and thought content normal.    LABORATORY PANEL:   CBC  Recent Labs Lab 08/03/16 2009  WBC 7.2  HGB 11.3*  HCT 31.9*  PLT 167   ------------------------------------------------------------------------------------------------------------------  Chemistries   Recent Labs Lab 08/03/16 2009  NA 134*  K 3.2*  CL 100*  CO2 25  GLUCOSE 99  BUN 14  CREATININE 0.73  CALCIUM 8.3*   ------------------------------------------------------------------------------------------------------------------  Cardiac Enzymes No results for input(s): TROPONINI in the last 168 hours. ------------------------------------------------------------------------------------------------------------------  RADIOLOGY:  Dg Lumbar Spine 2-3 Views  Result Date:  08/03/2016 CLINICAL DATA:  Initial evaluation for acute nontraumatic back pain. EXAM: LUMBAR SPINE - 2-3 VIEW COMPARISON:  Prior MRI from 09/30/2015. FINDINGS: Five non rib-bearing lumbar type vertebral bodies present. Minimal scoliosis. Trace retrolisthesis of L1 on L2. Vertebral bodies otherwise normally aligned with preservation of the normal lumbar lordosis. Vertebral body heights preserved. No acute fracture. Visualized sacrum intact. SI joints approximated. Asymmetric sclerosis about the left SI joint noted. Advanced multilevel degenerative spondylolysis present throughout the lumbar spine, most prevalent at L1-2 and L2-3. No acute soft tissue abnormality. Advanced atheromatous plaque noted within the intra-abdominal aorta. IMPRESSION: 1. No radiographic evidence for acute abnormality within the lumbar spine. 2. Advanced multilevel degenerative spondylolysis, greatest at L1-2 and L2-3. 3. Advanced atheromatous plaque within the intra-abdominal aorta. Electronically Signed   By: Jeannine Boga M.D.   On: 08/03/2016 18:31    EKG:   Orders placed or performed during the hospital encounter of 10/15/15  . ED EKG  . ED EKG  . EKG 12-Lead  . EKG 12-Lead    IMPRESSION AND PLAN:  Principal Problem:   Back pain - analgesia in place when necessary, MRI ordered Active Problems:   Diabetes ([place]) -  sliding scale insulin with corresponding glucose checks   Hypercholesteremia - continue home meds   Acquired hypothyroidism - home dose thyroid replacement  All the records are reviewed and case discussed with ED provider. Management plans discussed with the patient and/or family.  DVT PROPHYLAXIS: SubQ lovenox  GI PROPHYLAXIS: None  ADMISSION STATUS: Observation  CODE STATUS: Full Code Status History    This patient does not have a recorded code status. Please follow your organizational policy for patients in this situation.    Advance Directive Documentation   Flowsheet Row Most Recent  Value  Type of Advance Directive  Healthcare Power of Attorney  Pre-existing out of facility DNR order (yellow form or pink MOST form)  No data  "MOST" Form in Place?  No data      TOTAL TIME TAKING CARE OF THIS PATIENT: 40 minutes.    Hollis Oh  08/04/2016, 12:12 AM  [facility]  Office  [phone]  CC: Primary care physician; Mar Daring, PA-C

## 2016-08-09 ENCOUNTER — Ambulatory Visit (INDEPENDENT_AMBULATORY_CARE_PROVIDER_SITE_OTHER): Payer: Medicare Other | Admitting: Physician Assistant

## 2016-08-09 ENCOUNTER — Encounter: Payer: Self-pay | Admitting: Physician Assistant

## 2016-08-09 VITALS — BP 140/70 | HR 71 | Temp 97.9°F | Resp 16 | Wt 119.0 lb

## 2016-08-09 DIAGNOSIS — E559 Vitamin D deficiency, unspecified: Secondary | ICD-10-CM | POA: Diagnosis not present

## 2016-08-09 DIAGNOSIS — M5136 Other intervertebral disc degeneration, lumbar region: Secondary | ICD-10-CM

## 2016-08-09 DIAGNOSIS — Z09 Encounter for follow-up examination after completed treatment for conditions other than malignant neoplasm: Secondary | ICD-10-CM

## 2016-08-09 DIAGNOSIS — M51369 Other intervertebral disc degeneration, lumbar region without mention of lumbar back pain or lower extremity pain: Secondary | ICD-10-CM

## 2016-08-09 DIAGNOSIS — Z23 Encounter for immunization: Secondary | ICD-10-CM

## 2016-08-09 MED ORDER — VITAMIN D (ERGOCALCIFEROL) 1.25 MG (50000 UNIT) PO CAPS: 50000.0000 [IU] | ORAL_CAPSULE | ORAL | 0 refills | Status: DC

## 2016-08-09 NOTE — Progress Notes (Signed)
Patient: Sherry Jones Female    DOB: 05-15-1941   75 y.o.   MRN: [id number] Visit Date: 08/09/2016  Today's Provider: Mar Daring, PA-C   Chief Complaint  Patient presents with  . Hospitalization Follow-up   Subjective:    HPI  Follow up Hospitalization  Patient was admitted to [facility] on 08/03/16 and discharged on 08/04/16. She was treated for acute midline low back pain and DJD with lumbar spine stenosis. Treatment for this included flexeril and Lidocaine 5%. She reports excellent compliance with treatment. She reports this condition is Improved. She is no longer taking the Flexeril and only uses the lidocaine patch at night to help her sleep. ------------------------------------------------------------------------------------     Allergies  Allergen Reactions  . Cefadroxil   . Cefuroxime   . Cephalosporins Other (See Comments)  . Ciprofloxacin Other (See Comments)  . Codeine Swelling  . Gabapentin Nausea And Vomiting  . Hydrocodone-Acetaminophen Other (See Comments)  . Levofloxacin Other (See Comments)  . Macrolides And Ketolides     Other reaction(s): Other (See Comments) Other Reaction: Intolerance  . Meloxicam   . Oxycodone   . Tramadol   . Methocarbamol Rash  . Nitrofuran Derivatives Other (See Comments)    Other Reaction: Intolerance     Current Outpatient Prescriptions:  .  cyclobenzaprine (FLEXERIL) 5 MG tablet, Take 1 tablet (5 mg total) by mouth 3 (three) times daily as needed for muscle spasms., Disp: 30 tablet, Rfl: 0 .  levothyroxine (SYNTHROID, LEVOTHROID) 75 MCG tablet, TAKE 1 TABLET BY MOUTH EVERY DAY, Disp: 90 tablet, Rfl: 1 .  lidocaine (LIDODERM) 5 %, Place 1 patch onto the skin daily. Remove & Discard patch within 12 hours or as directed by MD, Disp: 25 patch, Rfl: 0 .  simvastatin (ZOCOR) 10 MG tablet, TAKE 1 TABLET BY MOUTH AT BEDTIME, Disp: 90 tablet, Rfl: 1 .  Vitamin D, Ergocalciferol, (DRISDOL) 50000 UNITS CAPS capsule,  Take 50,000 Units by mouth every 30 (thirty) days., Disp: , Rfl:   Review of Systems  Constitutional: Negative.   Respiratory: Negative.   Cardiovascular: Negative for chest pain, palpitations and leg swelling.  Musculoskeletal: Positive for joint swelling (right knee). Negative for back pain, gait problem, myalgias, neck pain and neck stiffness.  Neurological: Negative for weakness and numbness.    Social History  Substance Use Topics  . Smoking status: Former Smoker    Years: 36.00    Quit date: 01/02/1996  . Smokeless tobacco: Never Used  . Alcohol use No   Objective:   BP 140/70 (BP Location: Right Arm, Patient Position: Sitting, Cuff Size: Normal)   Pulse 71   Temp 97.9 F (36.6 C) (Oral)   Resp 16   Wt 119 lb (54 kg)   BMI 28.69 kg/m   Physical Exam  Constitutional: She appears well-developed and well-nourished. No distress.  Neck: Normal range of motion. Neck supple.  Cardiovascular: Normal rate, regular rhythm and normal heart sounds.  Exam reveals no gallop and no friction rub.   No murmur heard. Pulmonary/Chest: Effort normal and breath sounds normal. No respiratory distress. She has no wheezes. She has no rales.  Skin: She is not diaphoretic.  Psychiatric: She has a normal mood and affect. Her behavior is normal. Judgment and thought content normal.  Vitals reviewed.     Assessment & Plan:     1. Hospital discharge follow-up Doing well after ER visit for back pain. She did take the Flexeril the first  couple of nights but is no longer needing it. She does still use the lidocaine patch at night to help her sleep. 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 She has been needing a total knee replacement for a while but has been holding off due to her husband's health. Her husband recently passed away on Aug 11, 2023 and she is now ready to get her knee replacement. She is followed by Dr. Alvin Critchley for  this and she is going to set up an appointment with him for further evaluation and consideration for this surgery.  2. DDD (degenerative disc disease), lumbar Noted on x-ray from hospitalization with lumbar stenosis.  3. Vitamin D deficiency Stable. Diagnosis pulled for medication refill. Continue current medical treatment plan. - Vitamin D, Ergocalciferol, (DRISDOL) 50000 units CAPS capsule; Take 1 capsule (50,000 Units total) by mouth every 30 (thirty) days.  Dispense: 12 capsule; Refill: 0  4. Need for influenza vaccination Flu vaccine given today without complication. Patient sat upright for 15 minutes to check for adverse reaction before being released. - Flu vaccine HIGH DOSE PF (Fluzone High dose)       Mar Daring, PA-C  [facility]

## 2016-08-09 NOTE — Patient Instructions (Signed)
Degenerative Disk Disease  Degenerative disk disease is a condition caused by the changes that occur in spinal disks as you grow older. Spinal disks are soft and compressible disks located between the bones of your spine (vertebrae). These disks act like shock absorbers. Degenerative disk disease can affect the whole spine. However, the neck and lower back are most commonly affected. Many changes can occur in the spinal disks with aging, such as:  · The spinal disks may dry and shrink.  · Small tears may occur in the tough, outer covering of the disk (annulus).  · The disk space may become smaller due to loss of water.  · Abnormal growths in the bone (spurs) may occur. This can put pressure on the nerve roots exiting the spinal canal, causing pain.  · The spinal canal may become narrowed.  RISK FACTORS   · Being overweight.  · Having a family history of degenerative disk disease.  · Smoking.  · There is increased risk if you are doing heavy lifting or have a sudden injury.  SIGNS AND SYMPTOMS   Symptoms vary from person to person and may include:  · Pain that varies in intensity. Some people have no pain, while others have severe pain. The location of the pain depends on the part of your backbone that is affected.  ¨ You will have neck or arm pain if a disk in the neck area is affected.  ¨ You will have pain in your back, buttocks, or legs if a disk in the lower back is affected.  · Pain that becomes worse while bending, reaching up, or with twisting movements.  · Pain that may start gradually and then get worse as time passes. It may also start after a major or minor injury.  · Numbness or tingling in the arms or legs.  DIAGNOSIS   Your health care provider will ask you about your symptoms and about activities or habits that may cause the pain. He or she may also ask about any injuries, diseases, or treatments you have had. Your health care provider will examine you to check for the range of movement that is  possible in the affected area, to check for strength in your extremities, and to check for sensation in the areas of the arms and legs supplied by different nerve roots. You may also have:   · An X-ray of the spine.  · Other imaging tests, such as MRI.  TREATMENT   Your health care provider will advise you on the best plan for treatment. Treatment may include:  · Medicines.  · Rehabilitation exercises.  HOME CARE INSTRUCTIONS   · Follow proper lifting and walking techniques as advised by your health care provider.  · Maintain good posture.  · Exercise regularly as advised by your health care provider.  · Perform relaxation exercises.  · Change your sitting, standing, and sleeping habits as advised by your health care provider.  · Change positions frequently.  · Lose weight or maintain a healthy weight as advised by your health care provider.  · Do not use any tobacco products, including cigarettes, chewing tobacco, or electronic cigarettes. If you need help quitting, ask your health care provider.  · Wear supportive footwear.  · Take medicines only as directed by your health care provider.  SEEK MEDICAL CARE IF:   · Your pain does not go away within 1-4 weeks.  · You have significant appetite or weight loss.  SEEK IMMEDIATE MEDICAL CARE IF:   ·   Your pain is severe.  · You notice weakness in your arms, hands, or legs.  · You begin to lose control of your bladder or bowel movements.  · You have fevers or night sweats.  MAKE SURE YOU:   · Understand these instructions.  · Will watch your condition.  · Will get help right away if you are not doing well or get worse.     This information is not intended to replace advice given to you by your health care provider. Make sure you discuss any questions you have with your health care provider.     Document Released: 07/17/2007 Document Revised: 10/10/2014 Document Reviewed: 01/21/2014  Elsevier Interactive Patient Education ©2016 [employer]

## 2016-08-29 ENCOUNTER — Other Ambulatory Visit: Payer: Self-pay | Admitting: Family Medicine

## 2016-08-29 DIAGNOSIS — E7800 Pure hypercholesterolemia, unspecified: Secondary | ICD-10-CM

## 2016-09-29 ENCOUNTER — Encounter: Payer: Self-pay | Admitting: Physician Assistant

## 2016-09-29 ENCOUNTER — Ambulatory Visit (INDEPENDENT_AMBULATORY_CARE_PROVIDER_SITE_OTHER): Payer: Medicare Other | Admitting: Physician Assistant

## 2016-09-29 VITALS — BP 122/70 | HR 79 | Temp 98.2°F | Resp 16 | Wt 116.0 lb

## 2016-09-29 DIAGNOSIS — J40 Bronchitis, not specified as acute or chronic: Secondary | ICD-10-CM

## 2016-09-29 DIAGNOSIS — J029 Acute pharyngitis, unspecified: Secondary | ICD-10-CM | POA: Diagnosis not present

## 2016-09-29 DIAGNOSIS — R059 Cough, unspecified: Secondary | ICD-10-CM

## 2016-09-29 DIAGNOSIS — R05 Cough: Secondary | ICD-10-CM | POA: Diagnosis not present

## 2016-09-29 MED ORDER — AMOXICILLIN 500 MG PO CAPS: 500.0000 mg | ORAL_CAPSULE | Freq: Two times a day (BID) | ORAL | 0 refills | Status: DC

## 2016-09-29 MED ORDER — PREDNISONE 10 MG (21) PO TBPK
ORAL_TABLET | ORAL | 0 refills | Status: DC
Start: 2016-09-29 — End: 2017-01-20

## 2016-09-29 MED ORDER — BENZONATATE 100 MG PO CAPS: 100.0000 mg | ORAL_CAPSULE | Freq: Three times a day (TID) | ORAL | 0 refills | Status: DC | PRN

## 2016-09-29 NOTE — Patient Instructions (Signed)
Pharyngitis Pharyngitis is redness, pain, and swelling (inflammation) of your pharynx. What are the causes? Pharyngitis is usually caused by infection. Most of the time, these infections are from viruses (viral) and are part of a cold. However, sometimes pharyngitis is caused by bacteria (bacterial). Pharyngitis can also be caused by allergies. Viral pharyngitis may be spread from person to person by coughing, sneezing, and personal items or utensils (cups, forks, spoons, toothbrushes). Bacterial pharyngitis may be spread from person to person by more intimate contact, such as kissing. What are the signs or symptoms? Symptoms of pharyngitis include:  Sore throat.  Tiredness (fatigue).  Low-grade fever.  Headache.  Joint pain and muscle aches.  Skin rashes.  Swollen lymph nodes.  Plaque-like film on throat or tonsils (often seen with bacterial pharyngitis). How is this diagnosed? Your health care provider will ask you questions about your illness and your symptoms. Your medical history, along with a physical exam, is often all that is needed to diagnose pharyngitis. Sometimes, a rapid strep test is done. Other lab tests may also be done, depending on the suspected cause. How is this treated? Viral pharyngitis will usually get better in 3-4 days without the use of medicine. Bacterial pharyngitis is treated with medicines that kill germs (antibiotics). Follow these instructions at home:  Drink enough water and fluids to keep your urine clear or pale yellow.  Only take over-the-counter or prescription medicines as directed by your health care provider:  If you are prescribed antibiotics, make sure you finish them even if you start to feel better.  Do not take aspirin.  Get lots of rest.  Gargle with 8 oz of salt water ( tsp of salt per 1 qt of water) as often as every 1-2 hours to soothe your throat.  Throat lozenges (if you are not at risk for choking) or sprays may be used to  soothe your throat. Contact a health care provider if:  You have large, tender lumps in your neck.  You have a rash.  You cough up green, yellow-brown, or bloody spit. Get help right away if:  Your neck becomes stiff.  You drool or are unable to swallow liquids.  You vomit or are unable to keep medicines or liquids down.  You have severe pain that does not go away with the use of recommended medicines.  You have trouble breathing (not caused by a stuffy nose). This information is not intended to replace advice given to you by your health care provider. Make sure you discuss any questions you have with your health care provider. Document Released: 09/19/2005 Document Revised: 02/25/2016 Document Reviewed: 05/27/2013 Elsevier Interactive Patient Education  2017 Elsevier Inc[employer] Acute Bronchitis, Adult Acute bronchitis is sudden (acute) swelling of the air tubes (bronchi) in the lungs. Acute bronchitis causes these tubes to fill with mucus, which can make it hard to breathe. It can also cause coughing or wheezing. In adults, acute bronchitis usually goes away within 2 weeks. A cough caused by bronchitis may last up to 3 weeks. Smoking, allergies, and asthma can make the condition worse. Repeated episodes of bronchitis may cause further lung problems, such as chronic obstructive pulmonary disease (COPD). What are the causes? This condition can be caused by germs and by substances that irritate the lungs, including:  Cold and flu viruses. This condition is most often caused by the same virus that causes a cold.  Bacteria.  Exposure to tobacco smoke, dust, fumes, and air pollution. What increases the risk? This  condition is more likely to develop in people who:  Have close contact with someone with acute bronchitis.  Are exposed to lung irritants, such as tobacco smoke, dust, fumes, and vapors.  Have a weak immune system.  Have a respiratory condition such as asthma. What are the  signs or symptoms? Symptoms of this condition include:  A cough.  Coughing up clear, yellow, or green mucus.  Wheezing.  Chest congestion.  Shortness of breath.  A fever.  Body aches.  Chills.  A sore throat. How is this diagnosed? This condition is usually diagnosed with a physical exam. During the exam, your health care provider may order tests, such as chest X-rays, to rule out other conditions. He or she may also:  Test a sample of your mucus for bacterial infection.  Check the level of oxygen in your blood. This is done to check for pneumonia.  Do a chest X-ray or lung function testing to rule out pneumonia and other conditions.  Perform blood tests. Your health care provider will also ask about your symptoms and medical history. How is this treated? Most cases of acute bronchitis clear up over time without treatment. Your health care provider may recommend:  Drinking more fluids. Drinking more makes your mucus thinner, which may make it easier to breathe.  Taking a medicine for a fever or cough.  Taking an antibiotic medicine.  Using an inhaler to help improve shortness of breath and to control a cough.  Using a cool mist vaporizer or humidifier to make it easier to breathe. Follow these instructions at home: Medicines  Take over-the-counter and prescription medicines only as told by your health care provider.  If you were prescribed an antibiotic, take it as told by your health care provider. Do not stop taking the antibiotic even if you start to feel better. General instructions  Get plenty of rest.  Drink enough fluids to keep your urine clear or pale yellow.  Avoid smoking and secondhand smoke. Exposure to cigarette smoke or irritating chemicals will make bronchitis worse. If you smoke and you need help quitting, ask your health care provider. Quitting smoking will help your lungs heal faster.  Use an inhaler, cool mist vaporizer, or humidifier as  told by your health care provider.  Keep all follow-up visits as told by your health care provider. This is important. How is this prevented? To lower your risk of getting this condition again:  Wash your hands often with soap and water. If soap and water are not available, use hand sanitizer.  Avoid contact with people who have cold symptoms.  Try not to touch your hands to your mouth, nose, or eyes.  Make sure to get the flu shot every year. Contact a health care provider if:  Your symptoms do not improve in 2 weeks of treatment. Get help right away if:  You cough up blood.  You have chest pain.  You have severe shortness of breath.  You become dehydrated.  You faint or keep feeling like you are going to faint.  You keep vomiting.  You have a severe headache.  Your fever or chills gets worse. This information is not intended to replace advice given to you by your health care provider. Make sure you discuss any questions you have with your health care provider. Document Released: 10/27/2004 Document Revised: 04/13/2016 Document Reviewed: 03/09/2016 Elsevier Interactive Patient Education  2017 [employer].

## 2016-09-29 NOTE — Progress Notes (Signed)
Patient: Sherry Jones Female    DOB: Feb 02, 1941   75 y.o.   MRN: [id number] Visit Date: 09/29/2016  Today's Provider: Mar Daring, PA-C   Chief Complaint  Patient presents with  . URI   Subjective:    URI   This is a new problem. The current episode started yesterday (Started last night). There has been no fever. Associated symptoms include chest pain ("burning"), congestion (chest congestion), coughing (dry), rhinorrhea, sneezing and a sore throat. Pertinent negatives include no abdominal pain, diarrhea, ear pain, headaches, nausea, neck pain, plugged ear sensation, sinus pain, swollen glands, vomiting or wheezing. Treatments tried: warm salt water gargles, throat lozenges. The treatment provided mild relief.       Allergies  Allergen Reactions  . Cefadroxil   . Cefuroxime   . Cephalosporins Other (See Comments)  . Ciprofloxacin Other (See Comments)  . Codeine Swelling  . Gabapentin Nausea And Vomiting  . Hydrocodone-Acetaminophen Other (See Comments)  . Levofloxacin Other (See Comments)  . Macrolides And Ketolides     Other reaction(s): Other (See Comments) Other Reaction: Intolerance  . Meloxicam   . Oxycodone   . Tramadol   . Methocarbamol Rash  . Nitrofuran Derivatives Other (See Comments)    Other Reaction: Intolerance     Current Outpatient Prescriptions:  .  levothyroxine (SYNTHROID, LEVOTHROID) 75 MCG tablet, TAKE 1 TABLET BY MOUTH EVERY DAY, Disp: 90 tablet, Rfl: 1 .  simvastatin (ZOCOR) 10 MG tablet, TAKE 1 TABLET BY MOUTH AT BEDTIME, Disp: 90 tablet, Rfl: 1 .  Vitamin D, Ergocalciferol, (DRISDOL) 50000 units CAPS capsule, Take 1 capsule (50,000 Units total) by mouth every 30 (thirty) days., Disp: 12 capsule, Rfl: 0  Review of Systems  Constitutional: Negative for chills, fatigue and fever.  HENT: Positive for congestion (chest congestion), postnasal drip, rhinorrhea, sneezing and sore throat. Negative for ear pain, sinus pain, sinus  pressure and trouble swallowing.   Respiratory: Positive for cough (dry). Negative for chest tightness and wheezing.   Cardiovascular: Positive for chest pain ("burning"). Negative for palpitations and leg swelling.  Gastrointestinal: Negative for abdominal pain, diarrhea, nausea and vomiting.  Musculoskeletal: Negative for neck pain.  Neurological: Negative for dizziness and headaches.    Social History  Substance Use Topics  . Smoking status: Former Smoker    Years: 36.00    Quit date: 01/02/1996  . Smokeless tobacco: Never Used  . Alcohol use No   Objective:   BP 122/70 (BP Location: Left Arm, Patient Position: Sitting, Cuff Size: Normal)   Pulse 79   Temp 98.2 F (36.8 C) (Oral)   Resp 16   Wt 116 lb (52.6 kg)   SpO2 96%   BMI 27.97 kg/m   Physical Exam  Constitutional: She appears well-developed and well-nourished. No distress.  HENT:  Head: Normocephalic and atraumatic.  Right Ear: Hearing, tympanic membrane, external ear and ear canal normal.  Left Ear: Hearing, tympanic membrane, external ear and ear canal normal.  Nose: Mucosal edema present. No rhinorrhea. Right sinus exhibits no maxillary sinus tenderness and no frontal sinus tenderness. Left sinus exhibits no maxillary sinus tenderness and no frontal sinus tenderness.  Mouth/Throat: Uvula is midline and mucous membranes are normal. Posterior oropharyngeal edema and posterior oropharyngeal erythema present. No oropharyngeal exudate.  Eyes: Conjunctivae are normal. Pupils are equal, round, and reactive to light. Right eye exhibits no discharge. Left eye exhibits no discharge. No scleral icterus.  Neck: Normal range of motion. Neck supple.  No tracheal deviation present. No thyromegaly present.  Cardiovascular: Normal rate, regular rhythm and normal heart sounds.  Exam reveals no gallop and no friction rub.   No murmur heard. Pulmonary/Chest: Effort normal. No stridor. No respiratory distress. She has decreased breath  sounds. She has no wheezes. She has no rhonchi. She has no rales.  Lymphadenopathy:    She has no cervical adenopathy.  Skin: Skin is warm and dry. She is not diaphoretic.  Vitals reviewed.     Assessment & Plan:     1. Pharyngitis, unspecified etiology Worsening symptoms. Will treat with amoxicillin as below. She is to continue salt water gargles. May benefit with chloraseptic spray as well. She is to call if symptoms worsen. - amoxicillin (AMOXIL) 500 MG capsule; Take 1 capsule (500 mg total) by mouth 2 (two) times daily.  Dispense: 20 capsule; Refill: 0  2. Bronchitis Decreased breath sounds noted but no wheezing heard. Will give 6 day taper as below. She is to call if symptoms or SOB worsen. - predniSONE (STERAPRED UNI-PAK 21 TAB) 10 MG (21) TBPK tablet; Take as directed on package directions  Dispense: 21 tablet; Refill: 0  3. Cough Tessalon perles given for cough suppression. - benzonatate (TESSALON) 100 MG capsule; Take 1 capsule (100 mg total) by mouth 3 (three) times daily as needed for cough.  Dispense: 30 capsule; Refill: 0     Patient seen and examined by Mar Daring, PA-C, and note scribed by Renaldo Fiddler, CMA.  Mar Daring, PA-C  [facility]

## 2016-11-29 ENCOUNTER — Other Ambulatory Visit: Payer: Self-pay | Admitting: Physician Assistant

## 2016-11-29 DIAGNOSIS — E039 Hypothyroidism, unspecified: Secondary | ICD-10-CM

## 2017-01-20 ENCOUNTER — Ambulatory Visit: Payer: Medicare Other

## 2017-01-20 ENCOUNTER — Ambulatory Visit (INDEPENDENT_AMBULATORY_CARE_PROVIDER_SITE_OTHER): Payer: Medicare Other | Admitting: Physician Assistant

## 2017-01-20 VITALS — BP 128/64 | HR 80 | Temp 98.0°F | Ht <= 58 in | Wt 121.8 lb

## 2017-01-20 DIAGNOSIS — E7800 Pure hypercholesterolemia, unspecified: Secondary | ICD-10-CM

## 2017-01-20 DIAGNOSIS — Z0000 Encounter for general adult medical examination without abnormal findings: Secondary | ICD-10-CM

## 2017-01-20 DIAGNOSIS — H02834 Dermatochalasis of left upper eyelid: Secondary | ICD-10-CM

## 2017-01-20 DIAGNOSIS — Z1231 Encounter for screening mammogram for malignant neoplasm of breast: Secondary | ICD-10-CM

## 2017-01-20 DIAGNOSIS — E119 Type 2 diabetes mellitus without complications: Secondary | ICD-10-CM

## 2017-01-20 DIAGNOSIS — H02831 Dermatochalasis of right upper eyelid: Secondary | ICD-10-CM

## 2017-01-20 DIAGNOSIS — Z1239 Encounter for other screening for malignant neoplasm of breast: Secondary | ICD-10-CM

## 2017-01-20 DIAGNOSIS — Z13820 Encounter for screening for osteoporosis: Secondary | ICD-10-CM

## 2017-01-20 DIAGNOSIS — E039 Hypothyroidism, unspecified: Secondary | ICD-10-CM

## 2017-01-20 DIAGNOSIS — E559 Vitamin D deficiency, unspecified: Secondary | ICD-10-CM | POA: Diagnosis not present

## 2017-01-20 DIAGNOSIS — Z780 Asymptomatic menopausal state: Secondary | ICD-10-CM

## 2017-01-20 NOTE — Patient Instructions (Signed)
Health Maintenance for Postmenopausal Women Menopause is a normal process in which your reproductive ability comes to an end. This process happens gradually over a span of months to years, usually between the ages of 33 and 38. Menopause is complete when you have missed 12 consecutive menstrual periods. It is important to talk with your health care provider about some of the most common conditions that affect postmenopausal women, such as heart disease, cancer, and bone loss (osteoporosis). Adopting a healthy lifestyle and getting preventive care can help to promote your health and wellness. Those actions can also lower your chances of developing some of these common conditions. What should I know about menopause? During menopause, you may experience a number of symptoms, such as:  Moderate-to-severe hot flashes.  Night sweats.  Decrease in sex drive.  Mood swings.  Headaches.  Tiredness.  Irritability.  Memory problems.  Insomnia. Choosing to treat or not to treat menopausal changes is an individual decision that you make with your health care provider. What should I know about hormone replacement therapy and supplements? Hormone therapy products are effective for treating symptoms that are associated with menopause, such as hot flashes and night sweats. Hormone replacement carries certain risks, especially as you become older. If you are thinking about using estrogen or estrogen with progestin treatments, discuss the benefits and risks with your health care provider. What should I know about heart disease and stroke? Heart disease, heart attack, and stroke become more likely as you age. This may be due, in part, to the hormonal changes that your body experiences during menopause. These can affect how your body processes dietary fats, triglycerides, and cholesterol. Heart attack and stroke are both medical emergencies. There are many things that you can do to help prevent heart disease  and stroke:  Have your blood pressure checked at least every 1-2 years. High blood pressure causes heart disease and increases the risk of stroke.  If you are 48-61 years old, ask your health care provider if you should take aspirin to prevent a heart attack or a stroke.  Do not use any tobacco products, including cigarettes, chewing tobacco, or electronic cigarettes. If you need help quitting, ask your health care provider.  It is important to eat a healthy diet and maintain a healthy weight.  Be sure to include plenty of vegetables, fruits, low-fat dairy products, and lean protein.  Avoid eating foods that are high in solid fats, added sugars, or salt (sodium).  Get regular exercise. This is one of the most important things that you can do for your health.  Try to exercise for at least 150 minutes each week. The type of exercise that you do should increase your heart rate and make you sweat. This is known as moderate-intensity exercise.  Try to do strengthening exercises at least twice each week. Do these in addition to the moderate-intensity exercise.  Know your numbers.Ask your health care provider to check your cholesterol and your blood glucose. Continue to have your blood tested as directed by your health care provider. What should I know about cancer screening? There are several types of cancer. Take the following steps to reduce your risk and to catch any cancer development as early as possible. Breast Cancer  Practice breast self-awareness.  This means understanding how your breasts normally appear and feel.  It also means doing regular breast self-exams. Let your health care provider know about any changes, no matter how small.  If you are 40 or older,  have a clinician do a breast exam (clinical breast exam or CBE) every year. Depending on your age, family history, and medical history, it may be recommended that you also have a yearly breast X-ray (mammogram).  If you  have a family history of breast cancer, talk with your health care provider about genetic screening.  If you are at high risk for breast cancer, talk with your health care provider about having an MRI and a mammogram every year.  Breast cancer (BRCA) gene test is recommended for women who have family members with BRCA-related cancers. Results of the assessment will determine the need for genetic counseling and BRCA1 and for BRCA2 testing. BRCA-related cancers include these types:  Breast. This occurs in males or females.  Ovarian.  Tubal. This may also be called fallopian tube cancer.  Cancer of the abdominal or pelvic lining (peritoneal cancer).  Prostate.  Pancreatic. Cervical, Uterine, and Ovarian Cancer  Your health care provider may recommend that you be screened regularly for cancer of the pelvic organs. These include your ovaries, uterus, and vagina. This screening involves a pelvic exam, which includes checking for microscopic changes to the surface of your cervix (Pap test).  For women ages 21-65, health care providers may recommend a pelvic exam and a Pap test every three years. For women ages 23-65, they may recommend the Pap test and pelvic exam, combined with testing for human papilloma virus (HPV), every five years. Some types of HPV increase your risk of cervical cancer. Testing for HPV may also be done on women of any age who have unclear Pap test results.  Other health care providers may not recommend any screening for nonpregnant women who are considered low risk for pelvic cancer and have no symptoms. Ask your health care provider if a screening pelvic exam is right for you.  If you have had past treatment for cervical cancer or a condition that could lead to cancer, you need Pap tests and screening for cancer for at least 20 years after your treatment. If Pap tests have been discontinued for you, your risk factors (such as having a new sexual partner) need to be reassessed  to determine if you should start having screenings again. Some women have medical problems that increase the chance of getting cervical cancer. In these cases, your health care provider may recommend that you have screening and Pap tests more often.  If you have a family history of uterine cancer or ovarian cancer, talk with your health care provider about genetic screening.  If you have vaginal bleeding after reaching menopause, tell your health care provider.  There are currently no reliable tests available to screen for ovarian cancer. Lung Cancer  Lung cancer screening is recommended for adults 99-83 years old who are at high risk for lung cancer because of a history of smoking. A yearly low-dose CT scan of the lungs is recommended if you:  Currently smoke.  Have a history of at least 30 pack-years of smoking and you currently smoke or have quit within the past 15 years. A pack-year is smoking an average of one pack of cigarettes per day for one year. Yearly screening should:  Continue until it has been 15 years since you quit.  Stop if you develop a health problem that would prevent you from having lung cancer treatment. Colorectal Cancer  This type of cancer can be detected and can often be prevented.  Routine colorectal cancer screening usually begins at age 72 and continues  through age 75.  If you have risk factors for colon cancer, your health care provider may recommend that you be screened at an earlier age.  If you have a family history of colorectal cancer, talk with your health care provider about genetic screening.  Your health care provider may also recommend using home test kits to check for hidden blood in your stool.  A small camera at the end of a tube can be used to examine your colon directly (sigmoidoscopy or colonoscopy). This is done to check for the earliest forms of colorectal cancer.  Direct examination of the colon should be repeated every 5-10 years until  age 75. However, if early forms of precancerous polyps or small growths are found or if you have a family history or genetic risk for colorectal cancer, you may need to be screened more often. Skin Cancer  Check your skin from head to toe regularly.  Monitor any moles. Be sure to tell your health care provider:  About any new moles or changes in moles, especially if there is a change in a mole's shape or color.  If you have a mole that is larger than the size of a pencil eraser.  If any of your family members has a history of skin cancer, especially at a young age, talk with your health care provider about genetic screening.  Always use sunscreen. Apply sunscreen liberally and repeatedly throughout the day.  Whenever you are outside, protect yourself by wearing long sleeves, pants, a wide-brimmed hat, and sunglasses. What should I know about osteoporosis? Osteoporosis is a condition in which bone destruction happens more quickly than new bone creation. After menopause, you may be at an increased risk for osteoporosis. To help prevent osteoporosis or the bone fractures that can happen because of osteoporosis, the following is recommended:  If you are 19-50 years old, get at least 1,000 mg of calcium and at least 600 mg of vitamin D per day.  If you are older than age 50 but younger than age 70, get at least 1,200 mg of calcium and at least 600 mg of vitamin D per day.  If you are older than age 70, get at least 1,200 mg of calcium and at least 800 mg of vitamin D per day. Smoking and excessive alcohol intake increase the risk of osteoporosis. Eat foods that are rich in calcium and vitamin D, and do weight-bearing exercises several times each week as directed by your health care provider. What should I know about how menopause affects my mental health? Depression may occur at any age, but it is more common as you become older. Common symptoms of depression include:  Low or sad  mood.  Changes in sleep patterns.  Changes in appetite or eating patterns.  Feeling an overall lack of motivation or enjoyment of activities that you previously enjoyed.  Frequent crying spells. Talk with your health care provider if you think that you are experiencing depression. What should I know about immunizations? It is important that you get and maintain your immunizations. These include:  Tetanus, diphtheria, and pertussis (Tdap) booster vaccine.  Influenza every year before the flu season begins.  Pneumonia vaccine.  Shingles vaccine. Your health care provider may also recommend other immunizations. This information is not intended to replace advice given to you by your health care provider. Make sure you discuss any questions you have with your health care provider. Document Released: 11/11/2005 Document Revised: 04/08/2016 Document Reviewed: 06/23/2015 Elsevier Interactive Patient   Education  2017 [employer]  

## 2017-01-20 NOTE — Patient Instructions (Signed)
[name] , Thank you for taking time to come for your Medicare Wellness Visit. I appreciate your ongoing commitment to your health goals. Please review the following plan we discussed and let me know if I can assist you in the future.   Screening recommendations/referrals: Colonoscopy: last done 01/03/08, due 2019 Mammogram: last done 01/22/16 Bone Density: last done 09/11/14 Recommended yearly ophthalmology/optometry visit for glaucoma screening and checkup Recommended yearly dental visit for hygiene and checkup  Vaccinations: Influenza vaccine: done 08/09/16 Pneumococcal vaccine: completed series Tdap vaccine: done 11/11/15 Shingles vaccine: declined    Advanced directives: declined  Conditions/risks identified: fall risk prevention  Next appointment: None   Preventive Care 34 Years and Older, Female Preventive care refers to lifestyle choices and visits with your health care provider that can promote health and wellness. What does preventive care include?  A yearly physical exam. This is also called an annual well check.  Dental exams once or twice a year.  Routine eye exams. Ask your health care provider how often you should have your eyes checked.  Personal lifestyle choices, including:  Daily care of your teeth and gums.  Regular physical activity.  Eating a healthy diet.  Avoiding tobacco and drug use.  Limiting alcohol use.  Practicing safe sex.  Taking low-dose aspirin every day.  Taking vitamin and mineral supplements as recommended by your health care provider. What happens during an annual well check? The services and screenings done by your health care provider during your annual well check will depend on your age, overall health, lifestyle risk factors, and family history of disease. Counseling  Your health care provider may ask you questions about your:  Alcohol use.  Tobacco use.  Drug use.  Emotional well-being.  Home and relationship  well-being.  Sexual activity.  Eating habits.  History of falls.  Memory and ability to understand (cognition).  Work and work Statistician.  Reproductive health. Screening  You may have the following tests or measurements:  Height, weight, and BMI.  Blood pressure.  Lipid and cholesterol levels. These may be checked every 5 years, or more frequently if you are over 15 years old.  Skin check.  Lung cancer screening. You may have this screening every year starting at age 1 if you have a 30-pack-year history of smoking and currently smoke or have quit within the past 15 years.  Fecal occult blood test (FOBT) of the stool. You may have this test every year starting at age 47.  Flexible sigmoidoscopy or colonoscopy. You may have a sigmoidoscopy every 5 years or a colonoscopy every 10 years starting at age 20.  Hepatitis C blood test.  Hepatitis B blood test.  Sexually transmitted disease (STD) testing.  Diabetes screening. This is done by checking your blood sugar (glucose) after you have not eaten for a while (fasting). You may have this done every 1-3 years.  Bone density scan. This is done to screen for osteoporosis. You may have this done starting at age 45.  Mammogram. This may be done every 1-2 years. Talk to your health care provider about how often you should have regular mammograms. Talk with your health care provider about your test results, treatment options, and if necessary, the need for more tests. Vaccines  Your health care provider may recommend certain vaccines, such as:  Influenza vaccine. This is recommended every year.  Tetanus, diphtheria, and acellular pertussis (Tdap, Td) vaccine. You may need a Td booster every 10 years.  Zoster vaccine. You  may need this after age 49.  Pneumococcal 13-valent conjugate (PCV13) vaccine. One dose is recommended after age 53.  Pneumococcal polysaccharide (PPSV23) vaccine. One dose is recommended after age  20. Talk to your health care provider about which screenings and vaccines you need and how often you need them. This information is not intended to replace advice given to you by your health care provider. Make sure you discuss any questions you have with your health care provider. Document Released: 10/16/2015 Document Revised: 06/08/2016 Document Reviewed: 07/21/2015 Elsevier Interactive Patient Education  2017 [place] Prevention in the Home Falls can cause injuries. They can happen to people of all ages. There are many things you can do to make your home safe and to help prevent falls. What can I do on the outside of my home?  Regularly fix the edges of walkways and driveways and fix any cracks.  Remove anything that might make you trip as you walk through a door, such as a raised step or threshold.  Trim any bushes or trees on the path to your home.  Use bright outdoor lighting.  Clear any walking paths of anything that might make someone trip, such as rocks or tools.  Regularly check to see if handrails are loose or broken. Make sure that both sides of any steps have handrails.  Any raised decks and porches should have guardrails on the edges.  Have any leaves, snow, or ice cleared regularly.  Use sand or salt on walking paths during winter.  Clean up any spills in your garage right away. This includes oil or grease spills. What can I do in the bathroom?  Use night lights.  Install grab bars by the toilet and in the tub and shower. Do not use towel bars as grab bars.  Use non-skid mats or decals in the tub or shower.  If you need to sit down in the shower, use a plastic, non-slip stool.  Keep the floor dry. Clean up any water that spills on the floor as soon as it happens.  Remove soap buildup in the tub or shower regularly.  Attach bath mats securely with double-sided non-slip rug tape.  Do not have throw rugs and other things on the floor that can make  you trip. What can I do in the bedroom?  Use night lights.  Make sure that you have a light by your bed that is easy to reach.  Do not use any sheets or blankets that are too big for your bed. They should not hang down onto the floor.  Have a firm chair that has side arms. You can use this for support while you get dressed.  Do not have throw rugs and other things on the floor that can make you trip. What can I do in the kitchen?  Clean up any spills right away.  Avoid walking on wet floors.  Keep items that you use a lot in easy-to-reach places.  If you need to reach something above you, use a strong step stool that has a grab bar.  Keep electrical cords out of the way.  Do not use floor polish or wax that makes floors slippery. If you must use wax, use non-skid floor wax.  Do not have throw rugs and other things on the floor that can make you trip. What can I do with my stairs?  Do not leave any items on the stairs.  Make sure that there are handrails on both  sides of the stairs and use them. Fix handrails that are broken or loose. Make sure that handrails are as long as the stairways.  Check any carpeting to make sure that it is firmly attached to the stairs. Fix any carpet that is loose or worn.  Avoid having throw rugs at the top or bottom of the stairs. If you do have throw rugs, attach them to the floor with carpet tape.  Make sure that you have a light switch at the top of the stairs and the bottom of the stairs. If you do not have them, ask someone to add them for you. What else can I do to help prevent falls?  Wear shoes that:  Do not have high heels.  Have rubber bottoms.  Are comfortable and fit you well.  Are closed at the toe. Do not wear sandals.  If you use a stepladder:  Make sure that it is fully opened. Do not climb a closed stepladder.  Make sure that both sides of the stepladder are locked into place.  Ask someone to hold it for you, if  possible.  Clearly mark and make sure that you can see:  Any grab bars or handrails.  First and last steps.  Where the edge of each step is.  Use tools that help you move around (mobility aids) if they are needed. These include:  Canes.  Walkers.  Scooters.  Crutches.  Turn on the lights when you go into a dark area. Replace any light bulbs as soon as they burn out.  Set up your furniture so you have a clear path. Avoid moving your furniture around.  If any of your floors are uneven, fix them.  If there are any pets around you, be aware of where they are.  Review your medicines with your doctor. Some medicines can make you feel dizzy. This can increase your chance of falling. Ask your doctor what other things that you can do to help prevent falls. This information is not intended to replace advice given to you by your health care provider. Make sure you discuss any questions you have with your health care provider. Document Released: 07/16/2009 Document Revised: 02/25/2016 Document Reviewed: 10/24/2014 Elsevier Interactive Patient Education  2017 [employer].

## 2017-01-20 NOTE — Progress Notes (Signed)
Patient: Sherry Jones, Female    DOB: 01-29-1941, 76 y.o.   MRN: [id number] Visit Date: 01/20/2017  Today's Provider: Mar Daring, PA-C   Chief Complaint  Patient presents with  . Medicare Wellness   Subjective:    Annual wellness visit Sherry Jones is a 76 y.o. female. She feels well. She reports exercising daily streching. She reports she is sleeping well. 01/20/16 AWE 01/22/16 Mammogram-BI-RADS 1 01/03/08 Colonoscopy-internal hemorroids -----------------------------------------------------------   Review of Systems  Constitutional: Positive for activity change.  HENT: Negative.   Eyes: Positive for visual disturbance.  Respiratory: Negative.   Cardiovascular: Negative.   Gastrointestinal: Negative.   Endocrine: Negative.   Genitourinary: Negative.   Musculoskeletal: Positive for back pain.  Skin: Negative.   Allergic/Immunologic: Negative.   Neurological: Negative.   Hematological: Negative.   Psychiatric/Behavioral: Negative.     Social History   Social History  . Marital status: Widowed    Spouse name: N/A  . Number of children: N/A  . Years of education: N/A   Occupational History  . Not on file.   Social History Main Topics  . Smoking status: Former Smoker    Years: 36.00    Quit date: 01/02/1996  . Smokeless tobacco: Never Used  . Alcohol use No  . Drug use: No  . Sexual activity: No   Other Topics Concern  . Not on file   Social History Narrative  . No narrative on file    Past Medical History:  Diagnosis Date  . Cancer ([place])    uterine  . Diabetes mellitus without complication ([place])   . Hypercholesterolemia   . Thyroid disease      Patient Active Problem List   Diagnosis Date Noted  . Back pain 08/04/2016  . Diabetes ([place]) 08/04/2016  . Protein-calorie malnutrition, severe 08/04/2016  . Hyperglycemia 01/20/2016  . Skin laceration 12/04/2015  . Gonalgia 08/25/2015  . Contact dermatitis 08/11/2015  . Atypical  nevus 05/29/2015  . Shoulder pain, left 05/29/2015  . Hypercholesteremia 05/26/2015  . Arthropathy 04/29/2015  . Benign neoplasm of skin 06/04/2014  . 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 H/O ear disorder 06/04/2014  . Malignant neoplasm of uterus ([place]) 06/04/2014  . Avitaminosis D 11/20/2009  . Acquired hypothyroidism 11/26/2007  . Barrett esophagus 11/23/2007  . Narrowing of intervertebral disc space 11/23/2007  . 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 Acute embolism and thrombosis of deep vein of lower extremity ([place]) 11/22/2007  . Allergic rhinitis 11/22/2007  . Arthritis, degenerative 11/22/2007  . Disease of airway 11/22/2007  . Thromboembolism of vein 11/22/2007    Past Surgical History:  Procedure Laterality Date  . ABDOMINAL HYSTERECTOMY    . CATARACT EXTRACTION    . ROTATOR CUFF REPAIR Left     Her family history includes Alzheimer's disease in her mother; Diabetes in her brother; Osteoarthritis in her brother.      Current Outpatient Prescriptions:  .  amoxicillin (AMOXIL) 500 MG capsule, Take 1 capsule (500 mg total) by mouth 2 (two) times daily. (Patient not taking: Reported on 01/20/2017), Disp: 20 capsule, Rfl: 0 .  benzonatate (TESSALON) 100 MG capsule, Take 1 capsule (100 mg total) by mouth 3 (three) times daily as needed for cough. (Patient not taking: Reported on 01/20/2017), Disp: 30 capsule, Rfl: 0 .  levothyroxine (SYNTHROID, LEVOTHROID) 75 MCG tablet, TAKE 1 TABLET EVERY DAY, Disp: 90  tablet, Rfl: 1 .  predniSONE (STERAPRED UNI-PAK 21 TAB) 10 MG (21) TBPK tablet, Take as directed on package directions (Patient not taking: Reported on 01/20/2017), Disp: 21 tablet, Rfl: 0 .  simvastatin (ZOCOR) 10 MG tablet, TAKE 1 TABLET BY MOUTH AT BEDTIME, Disp: 90 tablet, Rfl: 1 .  Vitamin D, Ergocalciferol, (DRISDOL) 50000 units CAPS capsule, Take 1 capsule (50,000 Units total) by  mouth every 30 (thirty) days., Disp: 12 capsule, Rfl: 0  Patient Care Team: Mar Daring, PA-C as PCP - General (Family Medicine) Domingo Pulse, MD as Referring Physician (Orthopedic Surgery)     Objective:   Vitals:  BP  128/64 (BP Location: Right Arm)     Pulse  80     Temp  98 F (36.7 C) (Oral)     Ht  4\' 6"  (1.372 m)     Wt  121 lb 12.8 oz (55.2 kg)      BMI  29.37 kg/m       Physical Exam  Constitutional: She is oriented to person, place, and time. She appears well-developed and well-nourished. No distress.  HENT:  Head: Normocephalic and atraumatic.  Right Ear: External ear normal.  Left Ear: External ear normal.  Nose: Nose normal.  Mouth/Throat: Oropharynx is clear and moist. No oropharyngeal exudate.  Eyes: Conjunctivae and EOM are normal. Pupils are equal, round, and reactive to light. Right eye exhibits no discharge. Left eye exhibits no discharge. No scleral icterus.  Excessive skin on eyelids bilaterally. L>R. Left stating to effect peripheral vision.  Neck: Normal range of motion. Neck supple. No JVD present. No tracheal deviation present. No thyromegaly present.  Cardiovascular: Normal rate, regular rhythm, normal heart sounds and intact distal pulses.  Exam reveals no gallop and no friction rub.   No murmur heard. Pulmonary/Chest: Effort normal and breath sounds normal. No respiratory distress. She has no wheezes. She has no rales. She exhibits no tenderness.  Abdominal: Soft. Bowel sounds are normal. She exhibits no distension and no mass. There is no tenderness. There is no rebound and no guarding.  Musculoskeletal: Normal range of motion. She exhibits no edema or tenderness.  Lymphadenopathy:    She has no cervical adenopathy.  Neurological: She is alert and oriented to person, place, and time.  Skin: Skin is warm and dry. No rash noted. She is not diaphoretic.  Psychiatric: She has a normal mood and affect. Her behavior is normal.  Judgment and thought content normal.  Vitals reviewed.   Activities of Daily Living In your present state of health, do you have any difficulty performing the following activities: 01/20/2017 08/04/2016  Hearing? N N  Vision? N N  Difficulty concentrating or making decisions? N N  Walking or climbing stairs? Y N  Dressing or bathing? N N  Doing errands, shopping? N N  Preparing Food and eating ? N -  Using the Toilet? N -  In the past six months, have you accidently leaked urine? N -  Do you have problems with loss of bowel control? N -  Managing your Medications? N -  Managing your Finances? N -  Housekeeping or managing your Housekeeping? N -  Some recent data might be hidden    Fall Risk Assessment Fall Risk  01/20/2017 01/20/2016  Falls in the past year? Yes Yes  Number falls in past yr: 1 1  Injury with Fall? Yes Yes  Follow up Falls prevention discussed -     Depression Screen PHQ  2/9 Scores 01/20/2017 01/20/2016  PHQ - 2 Score 0 0    Cognitive Testing - 6-CIT  Correct? Score   What year is it? yes 0 0 or 4  What month is it? yes 0 0 or 3  Memorize:    Pia Mau,  43,  [place],      What time is it? (within 1 hour) yes 0 0 or 3  Count backwards from 20 yes 0 0, 2, or 4  Name the months of the year yes 0 0, 2, or 4  Repeat name & address above no 4 0, 2, 4, 6, 8, or 10       TOTAL SCORE  4/28   Interpretation:  Normal  Normal (0-7) Abnormal (8-28)    Audit-C Alcohol Use Screening  Question Answer Points  How often do you have alcoholic drink? never 0  On days you do drink alcohol, how many drinks do you typically consume? 0 0  How oftey will you drink 6 or more in a total? never 0  Total Score:  0   A score of 3 or more in women, and 4 or more in men indicates increased risk for alcohol abuse, EXCEPT if all of the points are from question 1.     Assessment & Plan:     Annual Wellness Visit  Reviewed patient's Family Medical  History Reviewed and updated list of patient's medical providers Assessment of cognitive impairment was done Assessed patient's functional ability Established a written schedule for health screening [place] Completed and Reviewed  Exercise Activities and Dietary recommendations Goals    . Increase water intake          Continue drinking 6-8 glasses of water a day.       Immunization History  Administered Date(s) Administered  . Influenza, High Dose Seasonal PF 08/09/2016  . Pneumococcal Conjugate-13 05/14/2014  . Pneumococcal Polysaccharide-23 05/27/2011  . Td 10/03/1997  . Tdap 05/27/2011, 11/21/2015  . Zoster 02/24/2012    Health Maintenance  Topic Date Due  . FOOT EXAM  10/22/1951  . URINE MICROALBUMIN  10/22/1951  . HEMOGLOBIN A1C  07/23/2016  . INFLUENZA VACCINE  05/03/2017  . OPHTHALMOLOGY EXAM  06/03/2017  . COLONOSCOPY  01/02/2018  . TETANUS/TDAP  11/20/2025  . DEXA SCAN  Completed  . PNA vac Low Risk Adult  Completed     Discussed health benefits of physical activity, and encouraged her to engage in regular exercise appropriate for her age and condition.    1. Annual physical exam Normal physical exam today. Will check labs as below and f/u pending lab results. If labs are stable and WNL she will not need to have these rechecked for one year at her next annual physical exam. She is to call the office in the meantime if she has any acute issue, questions or concerns.  2. Type 2 diabetes mellitus without complication, without long-term current use of insulin (HCC) Stable. Diet controlled. Will check labs as below and f/u pending results. - CBC w/Diff/Platelet - Comprehensive Metabolic Panel (CMET) - Lipid Profile - HgB A1c  3. Acquired hypothyroidism Stable on levothyroxine 72mcg. Will check labs as below and f/u pending results. - CBC w/Diff/Platelet - TSH  4. Avitaminosis D H/O this and on high dose Vit D supplementation. Will  check labs as below and f/u pending results. - CBC w/Diff/Platelet  5. Hypercholesteremia Stable. Continue simvastatin 10mg . Will check labs as below and  f/u pending results. - CBC w/Diff/Platelet - Comprehensive Metabolic Panel (CMET) - Lipid Profile - HgB A1c  6. Postmenopausal estrogen deficiency BMD ordered as below  - DG Bone Density; Future  7. Osteoporosis screening See above medical treatment plan. - DG Bone Density; Future  8. Breast cancer screening There is no family history of breast cancer. She does perform regular self breast exams. Mammogram was ordered as below. Information for [facility] clinic was given to patient so she may schedule her mammogram at her convenience. - MM Digital Screening; Future  9. Dermatochalasis of both upper eyelids Referral placed as below for dermatochalasis. L > R. Affecting peripheral vision.  - Ambulatory referral to Plastic Surgery  ------------------------------------------------------------------------------------------------------------    Mar Daring, PA-C  [place]

## 2017-01-20 NOTE — Progress Notes (Signed)
Subjective:   Sherry Jones is a 76 y.o. female who presents for Medicare Annual (Subsequent) preventive examination.  Review of Systems:  N/A  Cardiac Risk Factors include: advanced age (>62men, >62 women);diabetes mellitus;hypertension     Objective:     Vitals: BP 128/64 (BP Location: Right Arm)   Pulse 80   Temp 98 F (36.7 C) (Oral)   Ht 4\' 6"  (1.372 m)   Wt 121 lb 12.8 oz (55.2 kg)   BMI 29.37 kg/m   Body mass index is 29.37 kg/m.   Tobacco History  Smoking Status  . Former Smoker  . Years: 36.00  . Quit date: 01/02/1996  Smokeless Tobacco  . Never Used     Counseling given: Not Answered   Past Medical History:  Diagnosis Date  . Cancer ([place])    uterine  . Diabetes mellitus without complication ([place])   . Hypercholesterolemia   . Thyroid disease    Past Surgical History:  Procedure Laterality Date  . ABDOMINAL HYSTERECTOMY    . CATARACT EXTRACTION    . ROTATOR CUFF REPAIR Left    Family History  Problem Relation Age of Onset  . Alzheimer's disease Mother   . Diabetes Brother   . Osteoarthritis Brother    History  Sexual Activity  . Sexual activity: No    Outpatient Encounter Prescriptions as of 01/20/2017  Medication Sig  . levothyroxine (SYNTHROID, LEVOTHROID) 75 MCG tablet TAKE 1 TABLET EVERY DAY  . simvastatin (ZOCOR) 10 MG tablet TAKE 1 TABLET BY MOUTH AT BEDTIME  . Vitamin D, Ergocalciferol, (DRISDOL) 50000 units CAPS capsule Take 1 capsule (50,000 Units total) by mouth every 30 (thirty) days.  Marland Kitchen amoxicillin (AMOXIL) 500 MG capsule Take 1 capsule (500 mg total) by mouth 2 (two) times daily. (Patient not taking: Reported on 01/20/2017)  . benzonatate (TESSALON) 100 MG capsule Take 1 capsule (100 mg total) by mouth 3 (three) times daily as needed for cough. (Patient not taking: Reported on 01/20/2017)  . predniSONE (STERAPRED UNI-PAK 21 TAB) 10 MG (21) TBPK tablet Take as directed on package directions (Patient not taking: Reported on  01/20/2017)   No facility-administered encounter medications on file as of 01/20/2017.     Activities of Daily Living In your present state of health, do you have any difficulty performing the following activities: 01/20/2017 08/04/2016  Hearing? N N  Vision? N N  Difficulty concentrating or making decisions? N N  Walking or climbing stairs? Y N  Dressing or bathing? N N  Doing errands, shopping? N N  Preparing Food and eating ? N -  Using the Toilet? N -  In the past six months, have you accidently leaked urine? N -  Do you have problems with loss of bowel control? N -  Managing your Medications? N -  Managing your Finances? N -  Housekeeping or managing your Housekeeping? N -  Some recent data might be hidden    Patient Care Team: Mar Daring, PA-C as PCP - General (Family Medicine) Domingo Pulse, MD as Referring Physician (Orthopedic Surgery)    Assessment:     Exercise Activities and Dietary recommendations Current Exercise Habits: Home exercise routine, Type of exercise: stretching, Time (Minutes): 20, Frequency (Times/Week): 7, Weekly Exercise (Minutes/Week): 140, Intensity: Mild  Goals    . Increase water intake          Continue drinking 6-8 glasses of water a day.      Fall Risk Fall Risk  01/20/2017 01/20/2016  Falls in the past year? Yes Yes  Number falls in past yr: 1 1  Injury with Fall? Yes Yes  Follow up Falls prevention discussed -   Depression Screen PHQ 2/9 Scores 01/20/2017 01/20/2016  PHQ - 2 Score 0 0     Cognitive Function     6CIT Screen 01/20/2017  What Year? 0 points  What month? 0 points  What time? 0 points  Count back from 20 0 points  Months in reverse 0 points  Repeat phrase 8 points  Total Score 8    Immunization History  Administered Date(s) Administered  . Influenza, High Dose Seasonal PF 08/09/2016  . Pneumococcal Conjugate-13 05/14/2014  . Pneumococcal Polysaccharide-23 05/27/2011  . Td 10/03/1997  . Tdap  05/27/2011, 11/21/2015  . Zoster 02/24/2012   Screening Tests Health Maintenance  Topic Date Due  . FOOT EXAM  10/22/1950  . URINE MICROALBUMIN  10[birth year]22/1950  . HEMOGLOBIN A1C  07/23/2016  . INFLUENZA VACCINE  05/03/2017  . OPHTHALMOLOGY EXAM  06/03/2017  . COLONOSCOPY  01/02/2018  . TETANUS/TDAP  11/20/2025  . DEXA SCAN  Completed  . PNA vac Low Risk Adult  Completed      Plan:  I have personally reviewed and addressed the Medicare Annual Wellness questionnaire and have noted the following in the patient's chart:  A. Medical and social history B. Use of alcohol, tobacco or illicit drugs  C. Current medications and supplements D. Functional ability and status E.  Nutritional status F.  Physical activity G. Advance directives H. List of other physicians I.  Hospitalizations, surgeries, and ER visits in previous 12 months J.  [place] such as hearing and vision if needed, cognitive and depression L. Referrals and appointments - none  In addition, I have reviewed and discussed with patient certain preventive protocols, quality metrics, and best practice recommendations. A written personalized care plan for preventive services as well as general preventive health recommendations were provided to patient.  See attached scanned questionnaire for additional information.   Signed,  Fabio Neighbors, LPN Nurse Health Advisor   MD Recommendations: Pt needs a diabetic foot exam, urine microalbumin and hemoglobin A1c.  I have reviewed the documentation and information obtained by Fabio Neighbors, LPN in the above chart and agree as above. I was available for consultation if any questions or issues arose.  Fenton Malling, PA-C

## 2017-01-21 LAB — HEMOGLOBIN A1C
ESTIMATED AVERAGE GLUCOSE: 126 mg/dL
HEMOGLOBIN A1C: 6 % — AB (ref 4.8–5.6)

## 2017-01-21 LAB — CBC WITH DIFFERENTIAL/PLATELET
BASOS: 1 %
Basophils Absolute: 0.1 10*3/uL (ref 0.0–0.2)
EOS (ABSOLUTE): 0.1 10*3/uL (ref 0.0–0.4)
EOS: 2 %
HEMATOCRIT: 38.5 % (ref 34.0–46.6)
HEMOGLOBIN: 12.7 g/dL (ref 11.1–15.9)
IMMATURE GRANS (ABS): 0 10*3/uL (ref 0.0–0.1)
IMMATURE GRANULOCYTES: 0 %
LYMPHS: 26 %
Lymphocytes Absolute: 1.7 10*3/uL (ref 0.7–3.1)
MCH: 29.3 pg (ref 26.6–33.0)
MCHC: 33 g/dL (ref 31.5–35.7)
MCV: 89 fL (ref 79–97)
Monocytes Absolute: 0.5 10*3/uL (ref 0.1–0.9)
Monocytes: 7 %
NEUTROS ABS: 4.2 10*3/uL (ref 1.4–7.0)
NEUTROS PCT: 64 %
Platelets: 177 10*3/uL (ref 150–379)
RBC: 4.33 x10E6/uL (ref 3.77–5.28)
RDW: 15.4 % (ref 12.3–15.4)
WBC: 6.5 10*3/uL (ref 3.4–10.8)

## 2017-01-21 LAB — LIPID PANEL
CHOL/HDL RATIO: 3.3 ratio (ref 0.0–4.4)
Cholesterol, Total: 211 mg/dL — ABNORMAL HIGH (ref 100–199)
HDL: 63 mg/dL (ref >39)
LDL Calculated: 113 mg/dL — ABNORMAL HIGH (ref 0–99)
Triglycerides: 176 mg/dL — ABNORMAL HIGH (ref 0–149)
VLDL Cholesterol Cal: 35 mg/dL (ref 5–40)

## 2017-01-21 LAB — COMPREHENSIVE METABOLIC PANEL WITH GFR
ALT: 11 IU/L (ref 0–32)
AST: 19 IU/L (ref 0–40)
Albumin/Globulin Ratio: 1.5 (ref 1.2–2.2)
Albumin: 4.4 g/dL (ref 3.5–4.8)
Alkaline Phosphatase: 67 IU/L (ref 39–117)
BUN/Creatinine Ratio: 19 (ref 12–28)
BUN: 13 mg/dL (ref 8–27)
Bilirubin Total: 0.4 mg/dL (ref 0.0–1.2)
CO2: 26 mmol/L (ref 18–29)
Calcium: 9.8 mg/dL (ref 8.7–10.3)
Chloride: 98 mmol/L (ref 96–106)
Creatinine, Ser: 0.7 mg/dL (ref 0.57–1.00)
GFR calc Af Amer: 98 mL/min/1.73
GFR calc non Af Amer: 85 mL/min/1.73
Globulin, Total: 3 g/dL (ref 1.5–4.5)
Glucose: 134 mg/dL — ABNORMAL HIGH (ref 65–99)
Potassium: 4.5 mmol/L (ref 3.5–5.2)
Sodium: 142 mmol/L (ref 134–144)
Total Protein: 7.4 g/dL (ref 6.0–8.5)

## 2017-01-21 LAB — TSH: TSH: 1.37 u[IU]/mL (ref 0.450–4.500)

## 2017-01-23 DIAGNOSIS — Z8572 Personal history of non-Hodgkin lymphomas: Secondary | ICD-10-CM | POA: Insufficient documentation

## 2017-01-23 DIAGNOSIS — Z86718 Personal history of other venous thrombosis and embolism: Secondary | ICD-10-CM | POA: Insufficient documentation

## 2017-02-15 DIAGNOSIS — R52 Pain, unspecified: Secondary | ICD-10-CM | POA: Diagnosis not present

## 2017-02-15 DIAGNOSIS — M25462 Effusion, left knee: Secondary | ICD-10-CM | POA: Diagnosis not present

## 2017-02-15 DIAGNOSIS — M1712 Unilateral primary osteoarthritis, left knee: Secondary | ICD-10-CM | POA: Diagnosis not present

## 2017-02-23 ENCOUNTER — Telehealth: Payer: Self-pay | Admitting: Physician Assistant

## 2017-02-23 ENCOUNTER — Other Ambulatory Visit: Payer: Self-pay | Admitting: Family Medicine

## 2017-02-23 DIAGNOSIS — E7800 Pure hypercholesterolemia, unspecified: Secondary | ICD-10-CM

## 2017-02-23 MED ORDER — SIMVASTATIN 10 MG PO TABS: 10.0000 mg | ORAL_TABLET | Freq: Every day | ORAL | 3 refills | Status: DC

## 2017-02-23 NOTE — Telephone Encounter (Signed)
ERROR

## 2017-02-23 NOTE — Telephone Encounter (Signed)
sent 

## 2017-02-23 NOTE — Telephone Encounter (Signed)
[facility] pharmacy faxed a request for the following medication. Thanks CC   simvastatin (ZOCOR) 10 MG tablet  90-days  Take 1 tablet by mouth at bedtime.

## 2017-03-08 DIAGNOSIS — H02413 Mechanical ptosis of bilateral eyelids: Secondary | ICD-10-CM | POA: Diagnosis not present

## 2017-03-13 ENCOUNTER — Telehealth: Payer: Self-pay

## 2017-03-13 ENCOUNTER — Ambulatory Visit
Admission: RE | Admit: 2017-03-13 | Discharge: 2017-03-13 | Disposition: A | Discharge status: Home / Self Care | Payer: Medicare Other | Source: Ambulatory Visit | Attending: Physician Assistant | Admitting: Physician Assistant

## 2017-03-13 DIAGNOSIS — Z13820 Encounter for screening for osteoporosis: Secondary | ICD-10-CM | POA: Insufficient documentation

## 2017-03-13 DIAGNOSIS — M810 Age-related osteoporosis without current pathological fracture: Secondary | ICD-10-CM | POA: Insufficient documentation

## 2017-03-13 DIAGNOSIS — Z8572 Personal history of non-Hodgkin lymphomas: Secondary | ICD-10-CM | POA: Diagnosis not present

## 2017-03-13 DIAGNOSIS — Z780 Asymptomatic menopausal state: Secondary | ICD-10-CM

## 2017-03-13 DIAGNOSIS — Z1239 Encounter for other screening for malignant neoplasm of breast: Secondary | ICD-10-CM

## 2017-03-13 DIAGNOSIS — Z1231 Encounter for screening mammogram for malignant neoplasm of breast: Secondary | ICD-10-CM | POA: Diagnosis not present

## 2017-03-13 DIAGNOSIS — E119 Type 2 diabetes mellitus without complications: Secondary | ICD-10-CM | POA: Diagnosis not present

## 2017-03-13 NOTE — Telephone Encounter (Signed)
-----   Message from Mar Daring, [place] sent at 03/13/2017 12:27 PM EDT ----- Normal mammogram. Repeat screening in one year.

## 2017-03-13 NOTE — Telephone Encounter (Signed)
Left patient a voicemail advising her as directed below.

## 2017-03-14 DIAGNOSIS — M810 Age-related osteoporosis without current pathological fracture: Secondary | ICD-10-CM | POA: Diagnosis not present

## 2017-03-15 ENCOUNTER — Telehealth: Payer: Self-pay

## 2017-03-15 NOTE — Telephone Encounter (Signed)
-----   Message from Mar Daring, PA-C sent at 03/15/2017  1:30 PM EDT ----- Bone density has worsened and is now showing osteoporosis. Make sure to be taking calcium 1200mg  daily + Vit D3 800mg  daily. Would recommend for her to come in to discuss other osteoporosis treatment options.

## 2017-03-15 NOTE — Telephone Encounter (Signed)
Patient advised. She states she is only taking Vitamin D 50,000U monthly. Appointment scheduled for tomorrow to discuss treatment options.

## 2017-03-16 ENCOUNTER — Ambulatory Visit (INDEPENDENT_AMBULATORY_CARE_PROVIDER_SITE_OTHER): Payer: Medicare Other | Admitting: Physician Assistant

## 2017-03-16 ENCOUNTER — Encounter: Payer: Self-pay | Admitting: Physician Assistant

## 2017-03-16 VITALS — BP 140/60 | HR 106 | Temp 98.6°F | Wt 120.6 lb

## 2017-03-16 DIAGNOSIS — M810 Age-related osteoporosis without current pathological fracture: Secondary | ICD-10-CM

## 2017-03-16 MED ORDER — CALCIUM CARBONATE 600 MG PO TABS: 600.0000 mg | ORAL_TABLET | Freq: Two times a day (BID) | ORAL | 11 refills | Status: DC

## 2017-03-16 NOTE — Progress Notes (Signed)
   Patient: Sherry Jones Female    DOB: Sep 09, 1941   76 y.o.   MRN: [id number] Visit Date: 03/16/2017  Today's Provider: Mar Daring, PA-C   Chief Complaint  Patient presents with  . Follow-up   Subjective:    HPI Patient is here to discuss bone density results and treatment plan. Scan was done on 03/13/2017 which showed bone density has worsened and is now showing osteoporosis. Patient states she is currently taking Vitamin D 50,000U monthly. She is not taking calcium currently. Does not drink milk regularly. She does try to do some little exercises as her knee allows. She is scheduled to have a new replacement sometime in the near future.    Previous Medications   LEVOTHYROXINE (SYNTHROID, LEVOTHROID) 75 MCG TABLET    TAKE 1 TABLET EVERY DAY   SIMVASTATIN (ZOCOR) 10 MG TABLET    Take 1 tablet (10 mg total) by mouth at bedtime.   VITAMIN D, ERGOCALCIFEROL, (DRISDOL) 50000 UNITS CAPS CAPSULE    Take 1 capsule (50,000 Units total) by mouth every 30 (thirty) days.    Review of Systems  Constitutional: Negative.   Respiratory: Negative.   Cardiovascular: Negative.   Gastrointestinal: Negative.   Musculoskeletal: Positive for arthralgias (left knee), gait problem (antalgic) and joint swelling (left knee). Negative for back pain, myalgias, neck pain and neck stiffness.    Social History  Substance Use Topics  . Smoking status: Former Smoker    Years: 36.00    Quit date: 01/02/1996  . Smokeless tobacco: Never Used  . Alcohol use No   Objective:   BP 140/60 (BP Location: Right Arm, Patient Position: Sitting, Cuff Size: Normal)   Pulse (!) 106   Temp 98.6 F (37 C) (Oral)   Wt 120 lb 9.6 oz (54.7 kg)   SpO2 94%   BMI 29.08 kg/m   Physical Exam  Constitutional: She appears well-developed and well-nourished. No distress.  Neck: Normal range of motion. Neck supple.  Cardiovascular: Normal rate, regular rhythm and normal heart sounds.  Exam reveals no gallop and no friction  rub.   No murmur heard. Pulmonary/Chest: Effort normal and breath sounds normal. No respiratory distress. She has no wheezes. She has no rales.  Skin: She is not diaphoretic.  Vitals reviewed.     Assessment & Plan:     1. Age-related osteoporosis without current pathological fracture At this time patient wants to continue her high dose vit d supplementation and will add calcium supplementation as below. She is to continue weight bearing exercises as tolerated. We will recheck her BMD next year. If BMD worsens next year will consider starting a bisphosphonate.  - calcium carbonate (OS-CAL) 600 MG TABS tablet; Take 1 tablet (600 mg total) by mouth 2 (two) times daily with a meal.  Dispense: 60 tablet; Refill: 11   Follow up: No Follow-up on file.

## 2017-03-16 NOTE — Patient Instructions (Signed)
Osteoporosis Osteoporosis happens when your bones become thinner and weaker. Weak bones can break (fracture) more easily when you slip or fall. Bones most at risk of breaking are in the hip, wrist, and spine. Follow these instructions at home:  Get enough calcium and vitamin D. These nutrients are good for your bones.  Exercise as told by your doctor.  Do not use any tobacco products. This includes cigarettes, chewing tobacco, and electronic cigarettes. If you need help quitting, ask your doctor.  Limit the amount of alcohol you drink.  Take medicines only as told by your doctor.  Keep all follow-up visits as told by your doctor. This is important.  Take care at home to prevent falls. Some ways to do this are: ? Keep rooms well lit and tidy. ? Put safety rails on your stairs. ? Put a rubber mat in the bathroom and other places that are often wet or slippery. Get help right away if:  You fall.  You hurt yourself. This information is not intended to replace advice given to you by your health care provider. Make sure you discuss any questions you have with your health care provider. Document Released: 12/12/2011 Document Revised: 02/25/2016 Document Reviewed: 02/27/2014 Elsevier Interactive Patient Education  2018 [employer]  

## 2017-04-24 DIAGNOSIS — E039 Hypothyroidism, unspecified: Secondary | ICD-10-CM | POA: Diagnosis not present

## 2017-04-24 DIAGNOSIS — E785 Hyperlipidemia, unspecified: Secondary | ICD-10-CM | POA: Diagnosis not present

## 2017-04-24 DIAGNOSIS — S73005A Unspecified dislocation of left hip, initial encounter: Secondary | ICD-10-CM | POA: Diagnosis not present

## 2017-04-24 DIAGNOSIS — E119 Type 2 diabetes mellitus without complications: Secondary | ICD-10-CM | POA: Diagnosis not present

## 2017-04-24 DIAGNOSIS — M1712 Unilateral primary osteoarthritis, left knee: Secondary | ICD-10-CM | POA: Diagnosis not present

## 2017-04-24 DIAGNOSIS — Z01818 Encounter for other preprocedural examination: Secondary | ICD-10-CM | POA: Diagnosis not present

## 2017-05-04 DIAGNOSIS — M1712 Unilateral primary osteoarthritis, left knee: Secondary | ICD-10-CM | POA: Diagnosis not present

## 2017-05-04 DIAGNOSIS — M21062 Valgus deformity, not elsewhere classified, left knee: Secondary | ICD-10-CM | POA: Diagnosis not present

## 2017-05-04 DIAGNOSIS — Z96652 Presence of left artificial knee joint: Secondary | ICD-10-CM | POA: Diagnosis not present

## 2017-05-04 DIAGNOSIS — Z791 Long term (current) use of non-steroidal anti-inflammatories (NSAID): Secondary | ICD-10-CM | POA: Diagnosis not present

## 2017-05-04 DIAGNOSIS — Z87891 Personal history of nicotine dependence: Secondary | ICD-10-CM | POA: Diagnosis not present

## 2017-05-04 DIAGNOSIS — M25562 Pain in left knee: Secondary | ICD-10-CM | POA: Diagnosis not present

## 2017-05-04 DIAGNOSIS — E119 Type 2 diabetes mellitus without complications: Secondary | ICD-10-CM | POA: Diagnosis not present

## 2017-05-04 DIAGNOSIS — Z9889 Other specified postprocedural states: Secondary | ICD-10-CM | POA: Diagnosis not present

## 2017-05-04 DIAGNOSIS — M1612 Unilateral primary osteoarthritis, left hip: Secondary | ICD-10-CM | POA: Diagnosis not present

## 2017-05-04 DIAGNOSIS — E039 Hypothyroidism, unspecified: Secondary | ICD-10-CM | POA: Diagnosis not present

## 2017-05-04 DIAGNOSIS — Z7982 Long term (current) use of aspirin: Secondary | ICD-10-CM | POA: Diagnosis not present

## 2017-05-04 DIAGNOSIS — E785 Hyperlipidemia, unspecified: Secondary | ICD-10-CM | POA: Diagnosis not present

## 2017-05-04 DIAGNOSIS — Z8572 Personal history of non-Hodgkin lymphomas: Secondary | ICD-10-CM | POA: Diagnosis not present

## 2017-05-06 DIAGNOSIS — Z96652 Presence of left artificial knee joint: Secondary | ICD-10-CM | POA: Diagnosis not present

## 2017-05-06 DIAGNOSIS — Z86718 Personal history of other venous thrombosis and embolism: Secondary | ICD-10-CM | POA: Diagnosis not present

## 2017-05-06 DIAGNOSIS — Z7982 Long term (current) use of aspirin: Secondary | ICD-10-CM | POA: Diagnosis not present

## 2017-05-06 DIAGNOSIS — E039 Hypothyroidism, unspecified: Secondary | ICD-10-CM | POA: Diagnosis not present

## 2017-05-06 DIAGNOSIS — Z9181 History of falling: Secondary | ICD-10-CM | POA: Diagnosis not present

## 2017-05-06 DIAGNOSIS — M1990 Unspecified osteoarthritis, unspecified site: Secondary | ICD-10-CM | POA: Diagnosis not present

## 2017-05-06 DIAGNOSIS — Z87891 Personal history of nicotine dependence: Secondary | ICD-10-CM | POA: Diagnosis not present

## 2017-05-06 DIAGNOSIS — I10 Essential (primary) hypertension: Secondary | ICD-10-CM | POA: Diagnosis not present

## 2017-05-06 DIAGNOSIS — Z471 Aftercare following joint replacement surgery: Secondary | ICD-10-CM | POA: Diagnosis not present

## 2017-05-09 DIAGNOSIS — I10 Essential (primary) hypertension: Secondary | ICD-10-CM | POA: Diagnosis not present

## 2017-05-09 DIAGNOSIS — E039 Hypothyroidism, unspecified: Secondary | ICD-10-CM | POA: Diagnosis not present

## 2017-05-09 DIAGNOSIS — Z87891 Personal history of nicotine dependence: Secondary | ICD-10-CM | POA: Diagnosis not present

## 2017-05-09 DIAGNOSIS — Z96652 Presence of left artificial knee joint: Secondary | ICD-10-CM | POA: Diagnosis not present

## 2017-05-09 DIAGNOSIS — Z9181 History of falling: Secondary | ICD-10-CM | POA: Diagnosis not present

## 2017-05-09 DIAGNOSIS — Z86718 Personal history of other venous thrombosis and embolism: Secondary | ICD-10-CM | POA: Diagnosis not present

## 2017-05-09 DIAGNOSIS — M1990 Unspecified osteoarthritis, unspecified site: Secondary | ICD-10-CM | POA: Diagnosis not present

## 2017-05-09 DIAGNOSIS — Z7982 Long term (current) use of aspirin: Secondary | ICD-10-CM | POA: Diagnosis not present

## 2017-05-09 DIAGNOSIS — Z471 Aftercare following joint replacement surgery: Secondary | ICD-10-CM | POA: Diagnosis not present

## 2017-05-10 DIAGNOSIS — M1990 Unspecified osteoarthritis, unspecified site: Secondary | ICD-10-CM | POA: Diagnosis not present

## 2017-05-10 DIAGNOSIS — Z9181 History of falling: Secondary | ICD-10-CM | POA: Diagnosis not present

## 2017-05-10 DIAGNOSIS — Z87891 Personal history of nicotine dependence: Secondary | ICD-10-CM | POA: Diagnosis not present

## 2017-05-10 DIAGNOSIS — I10 Essential (primary) hypertension: Secondary | ICD-10-CM | POA: Diagnosis not present

## 2017-05-10 DIAGNOSIS — E039 Hypothyroidism, unspecified: Secondary | ICD-10-CM | POA: Diagnosis not present

## 2017-05-10 DIAGNOSIS — Z96652 Presence of left artificial knee joint: Secondary | ICD-10-CM | POA: Diagnosis not present

## 2017-05-10 DIAGNOSIS — Z86718 Personal history of other venous thrombosis and embolism: Secondary | ICD-10-CM | POA: Diagnosis not present

## 2017-05-10 DIAGNOSIS — Z471 Aftercare following joint replacement surgery: Secondary | ICD-10-CM | POA: Diagnosis not present

## 2017-05-10 DIAGNOSIS — Z7982 Long term (current) use of aspirin: Secondary | ICD-10-CM | POA: Diagnosis not present

## 2017-05-12 DIAGNOSIS — Z7982 Long term (current) use of aspirin: Secondary | ICD-10-CM | POA: Diagnosis not present

## 2017-05-12 DIAGNOSIS — Z471 Aftercare following joint replacement surgery: Secondary | ICD-10-CM | POA: Diagnosis not present

## 2017-05-12 DIAGNOSIS — E039 Hypothyroidism, unspecified: Secondary | ICD-10-CM | POA: Diagnosis not present

## 2017-05-12 DIAGNOSIS — Z96652 Presence of left artificial knee joint: Secondary | ICD-10-CM | POA: Diagnosis not present

## 2017-05-12 DIAGNOSIS — Z9181 History of falling: Secondary | ICD-10-CM | POA: Diagnosis not present

## 2017-05-12 DIAGNOSIS — Z86718 Personal history of other venous thrombosis and embolism: Secondary | ICD-10-CM | POA: Diagnosis not present

## 2017-05-12 DIAGNOSIS — Z87891 Personal history of nicotine dependence: Secondary | ICD-10-CM | POA: Diagnosis not present

## 2017-05-12 DIAGNOSIS — M1990 Unspecified osteoarthritis, unspecified site: Secondary | ICD-10-CM | POA: Diagnosis not present

## 2017-05-12 DIAGNOSIS — I10 Essential (primary) hypertension: Secondary | ICD-10-CM | POA: Diagnosis not present

## 2017-05-15 DIAGNOSIS — Z9181 History of falling: Secondary | ICD-10-CM | POA: Diagnosis not present

## 2017-05-15 DIAGNOSIS — Z471 Aftercare following joint replacement surgery: Secondary | ICD-10-CM | POA: Diagnosis not present

## 2017-05-15 DIAGNOSIS — Z7982 Long term (current) use of aspirin: Secondary | ICD-10-CM | POA: Diagnosis not present

## 2017-05-15 DIAGNOSIS — E039 Hypothyroidism, unspecified: Secondary | ICD-10-CM | POA: Diagnosis not present

## 2017-05-15 DIAGNOSIS — Z96652 Presence of left artificial knee joint: Secondary | ICD-10-CM | POA: Diagnosis not present

## 2017-05-15 DIAGNOSIS — Z86718 Personal history of other venous thrombosis and embolism: Secondary | ICD-10-CM | POA: Diagnosis not present

## 2017-05-15 DIAGNOSIS — Z87891 Personal history of nicotine dependence: Secondary | ICD-10-CM | POA: Diagnosis not present

## 2017-05-15 DIAGNOSIS — M1990 Unspecified osteoarthritis, unspecified site: Secondary | ICD-10-CM | POA: Diagnosis not present

## 2017-05-15 DIAGNOSIS — I10 Essential (primary) hypertension: Secondary | ICD-10-CM | POA: Diagnosis not present

## 2017-05-17 DIAGNOSIS — Z7982 Long term (current) use of aspirin: Secondary | ICD-10-CM | POA: Diagnosis not present

## 2017-05-17 DIAGNOSIS — E039 Hypothyroidism, unspecified: Secondary | ICD-10-CM | POA: Diagnosis not present

## 2017-05-17 DIAGNOSIS — Z96652 Presence of left artificial knee joint: Secondary | ICD-10-CM | POA: Diagnosis not present

## 2017-05-17 DIAGNOSIS — Z471 Aftercare following joint replacement surgery: Secondary | ICD-10-CM | POA: Diagnosis not present

## 2017-05-17 DIAGNOSIS — Z86718 Personal history of other venous thrombosis and embolism: Secondary | ICD-10-CM | POA: Diagnosis not present

## 2017-05-17 DIAGNOSIS — I10 Essential (primary) hypertension: Secondary | ICD-10-CM | POA: Diagnosis not present

## 2017-05-17 DIAGNOSIS — M1990 Unspecified osteoarthritis, unspecified site: Secondary | ICD-10-CM | POA: Diagnosis not present

## 2017-05-17 DIAGNOSIS — Z9181 History of falling: Secondary | ICD-10-CM | POA: Diagnosis not present

## 2017-05-17 DIAGNOSIS — Z87891 Personal history of nicotine dependence: Secondary | ICD-10-CM | POA: Diagnosis not present

## 2017-05-23 DIAGNOSIS — E039 Hypothyroidism, unspecified: Secondary | ICD-10-CM | POA: Diagnosis not present

## 2017-05-23 DIAGNOSIS — M1990 Unspecified osteoarthritis, unspecified site: Secondary | ICD-10-CM | POA: Diagnosis not present

## 2017-05-23 DIAGNOSIS — Z7982 Long term (current) use of aspirin: Secondary | ICD-10-CM | POA: Diagnosis not present

## 2017-05-23 DIAGNOSIS — Z96652 Presence of left artificial knee joint: Secondary | ICD-10-CM | POA: Diagnosis not present

## 2017-05-23 DIAGNOSIS — I10 Essential (primary) hypertension: Secondary | ICD-10-CM | POA: Diagnosis not present

## 2017-05-23 DIAGNOSIS — Z87891 Personal history of nicotine dependence: Secondary | ICD-10-CM | POA: Diagnosis not present

## 2017-05-23 DIAGNOSIS — Z9181 History of falling: Secondary | ICD-10-CM | POA: Diagnosis not present

## 2017-05-23 DIAGNOSIS — Z471 Aftercare following joint replacement surgery: Secondary | ICD-10-CM | POA: Diagnosis not present

## 2017-05-23 DIAGNOSIS — Z86718 Personal history of other venous thrombosis and embolism: Secondary | ICD-10-CM | POA: Diagnosis not present

## 2017-05-27 ENCOUNTER — Other Ambulatory Visit: Payer: Self-pay | Admitting: Physician Assistant

## 2017-05-27 DIAGNOSIS — E039 Hypothyroidism, unspecified: Secondary | ICD-10-CM

## 2017-05-29 DIAGNOSIS — Z7982 Long term (current) use of aspirin: Secondary | ICD-10-CM | POA: Diagnosis not present

## 2017-05-29 DIAGNOSIS — M1990 Unspecified osteoarthritis, unspecified site: Secondary | ICD-10-CM | POA: Diagnosis not present

## 2017-05-29 DIAGNOSIS — Z87891 Personal history of nicotine dependence: Secondary | ICD-10-CM | POA: Diagnosis not present

## 2017-05-29 DIAGNOSIS — E039 Hypothyroidism, unspecified: Secondary | ICD-10-CM | POA: Diagnosis not present

## 2017-05-29 DIAGNOSIS — Z9181 History of falling: Secondary | ICD-10-CM | POA: Diagnosis not present

## 2017-05-29 DIAGNOSIS — Z471 Aftercare following joint replacement surgery: Secondary | ICD-10-CM | POA: Diagnosis not present

## 2017-05-29 DIAGNOSIS — Z96652 Presence of left artificial knee joint: Secondary | ICD-10-CM | POA: Diagnosis not present

## 2017-05-29 DIAGNOSIS — I10 Essential (primary) hypertension: Secondary | ICD-10-CM | POA: Diagnosis not present

## 2017-05-29 DIAGNOSIS — Z86718 Personal history of other venous thrombosis and embolism: Secondary | ICD-10-CM | POA: Diagnosis not present

## 2017-05-31 DIAGNOSIS — Z7982 Long term (current) use of aspirin: Secondary | ICD-10-CM | POA: Diagnosis not present

## 2017-05-31 DIAGNOSIS — E039 Hypothyroidism, unspecified: Secondary | ICD-10-CM | POA: Diagnosis not present

## 2017-05-31 DIAGNOSIS — Z96652 Presence of left artificial knee joint: Secondary | ICD-10-CM | POA: Diagnosis not present

## 2017-05-31 DIAGNOSIS — I10 Essential (primary) hypertension: Secondary | ICD-10-CM | POA: Diagnosis not present

## 2017-05-31 DIAGNOSIS — Z9181 History of falling: Secondary | ICD-10-CM | POA: Diagnosis not present

## 2017-05-31 DIAGNOSIS — M1990 Unspecified osteoarthritis, unspecified site: Secondary | ICD-10-CM | POA: Diagnosis not present

## 2017-05-31 DIAGNOSIS — Z87891 Personal history of nicotine dependence: Secondary | ICD-10-CM | POA: Diagnosis not present

## 2017-05-31 DIAGNOSIS — Z471 Aftercare following joint replacement surgery: Secondary | ICD-10-CM | POA: Diagnosis not present

## 2017-05-31 DIAGNOSIS — Z86718 Personal history of other venous thrombosis and embolism: Secondary | ICD-10-CM | POA: Diagnosis not present

## 2017-06-06 DIAGNOSIS — I10 Essential (primary) hypertension: Secondary | ICD-10-CM | POA: Diagnosis not present

## 2017-06-06 DIAGNOSIS — Z86718 Personal history of other venous thrombosis and embolism: Secondary | ICD-10-CM | POA: Diagnosis not present

## 2017-06-06 DIAGNOSIS — E039 Hypothyroidism, unspecified: Secondary | ICD-10-CM | POA: Diagnosis not present

## 2017-06-06 DIAGNOSIS — Z7982 Long term (current) use of aspirin: Secondary | ICD-10-CM | POA: Diagnosis not present

## 2017-06-06 DIAGNOSIS — Z87891 Personal history of nicotine dependence: Secondary | ICD-10-CM | POA: Diagnosis not present

## 2017-06-06 DIAGNOSIS — Z96652 Presence of left artificial knee joint: Secondary | ICD-10-CM | POA: Diagnosis not present

## 2017-06-06 DIAGNOSIS — M1990 Unspecified osteoarthritis, unspecified site: Secondary | ICD-10-CM | POA: Diagnosis not present

## 2017-06-06 DIAGNOSIS — Z9181 History of falling: Secondary | ICD-10-CM | POA: Diagnosis not present

## 2017-06-06 DIAGNOSIS — Z471 Aftercare following joint replacement surgery: Secondary | ICD-10-CM | POA: Diagnosis not present

## 2017-06-08 DIAGNOSIS — Z86718 Personal history of other venous thrombosis and embolism: Secondary | ICD-10-CM | POA: Diagnosis not present

## 2017-06-08 DIAGNOSIS — Z9181 History of falling: Secondary | ICD-10-CM | POA: Diagnosis not present

## 2017-06-08 DIAGNOSIS — M1990 Unspecified osteoarthritis, unspecified site: Secondary | ICD-10-CM | POA: Diagnosis not present

## 2017-06-08 DIAGNOSIS — Z471 Aftercare following joint replacement surgery: Secondary | ICD-10-CM | POA: Diagnosis not present

## 2017-06-08 DIAGNOSIS — I10 Essential (primary) hypertension: Secondary | ICD-10-CM | POA: Diagnosis not present

## 2017-06-08 DIAGNOSIS — Z96652 Presence of left artificial knee joint: Secondary | ICD-10-CM | POA: Diagnosis not present

## 2017-06-08 DIAGNOSIS — Z87891 Personal history of nicotine dependence: Secondary | ICD-10-CM | POA: Diagnosis not present

## 2017-06-08 DIAGNOSIS — Z7982 Long term (current) use of aspirin: Secondary | ICD-10-CM | POA: Diagnosis not present

## 2017-06-08 DIAGNOSIS — E039 Hypothyroidism, unspecified: Secondary | ICD-10-CM | POA: Diagnosis not present

## 2017-06-12 DIAGNOSIS — Z471 Aftercare following joint replacement surgery: Secondary | ICD-10-CM | POA: Diagnosis not present

## 2017-06-12 DIAGNOSIS — I10 Essential (primary) hypertension: Secondary | ICD-10-CM | POA: Diagnosis not present

## 2017-06-12 DIAGNOSIS — M1990 Unspecified osteoarthritis, unspecified site: Secondary | ICD-10-CM | POA: Diagnosis not present

## 2017-06-12 DIAGNOSIS — Z86718 Personal history of other venous thrombosis and embolism: Secondary | ICD-10-CM | POA: Diagnosis not present

## 2017-06-12 DIAGNOSIS — E039 Hypothyroidism, unspecified: Secondary | ICD-10-CM | POA: Diagnosis not present

## 2017-06-12 DIAGNOSIS — Z96652 Presence of left artificial knee joint: Secondary | ICD-10-CM | POA: Diagnosis not present

## 2017-06-12 DIAGNOSIS — Z9181 History of falling: Secondary | ICD-10-CM | POA: Diagnosis not present

## 2017-06-12 DIAGNOSIS — Z87891 Personal history of nicotine dependence: Secondary | ICD-10-CM | POA: Diagnosis not present

## 2017-06-12 DIAGNOSIS — Z7982 Long term (current) use of aspirin: Secondary | ICD-10-CM | POA: Diagnosis not present

## 2017-06-19 DIAGNOSIS — Z471 Aftercare following joint replacement surgery: Secondary | ICD-10-CM | POA: Diagnosis not present

## 2017-06-19 DIAGNOSIS — Z96652 Presence of left artificial knee joint: Secondary | ICD-10-CM | POA: Diagnosis not present

## 2017-06-20 DIAGNOSIS — Z7982 Long term (current) use of aspirin: Secondary | ICD-10-CM | POA: Diagnosis not present

## 2017-06-20 DIAGNOSIS — I10 Essential (primary) hypertension: Secondary | ICD-10-CM | POA: Diagnosis not present

## 2017-06-20 DIAGNOSIS — Z87891 Personal history of nicotine dependence: Secondary | ICD-10-CM | POA: Diagnosis not present

## 2017-06-20 DIAGNOSIS — Z9181 History of falling: Secondary | ICD-10-CM | POA: Diagnosis not present

## 2017-06-20 DIAGNOSIS — M1990 Unspecified osteoarthritis, unspecified site: Secondary | ICD-10-CM | POA: Diagnosis not present

## 2017-06-20 DIAGNOSIS — Z471 Aftercare following joint replacement surgery: Secondary | ICD-10-CM | POA: Diagnosis not present

## 2017-06-20 DIAGNOSIS — Z86718 Personal history of other venous thrombosis and embolism: Secondary | ICD-10-CM | POA: Diagnosis not present

## 2017-06-20 DIAGNOSIS — E039 Hypothyroidism, unspecified: Secondary | ICD-10-CM | POA: Diagnosis not present

## 2017-06-20 DIAGNOSIS — Z96652 Presence of left artificial knee joint: Secondary | ICD-10-CM | POA: Diagnosis not present

## 2017-08-02 DIAGNOSIS — M1712 Unilateral primary osteoarthritis, left knee: Secondary | ICD-10-CM | POA: Diagnosis not present

## 2017-08-02 DIAGNOSIS — Z881 Allergy status to other antibiotic agents status: Secondary | ICD-10-CM | POA: Diagnosis not present

## 2017-08-02 DIAGNOSIS — I824Y1 Acute embolism and thrombosis of unspecified deep veins of right proximal lower extremity: Secondary | ICD-10-CM | POA: Diagnosis not present

## 2017-08-02 DIAGNOSIS — Z8572 Personal history of non-Hodgkin lymphomas: Secondary | ICD-10-CM | POA: Diagnosis not present

## 2017-08-02 DIAGNOSIS — E785 Hyperlipidemia, unspecified: Secondary | ICD-10-CM | POA: Diagnosis not present

## 2017-08-02 DIAGNOSIS — Z888 Allergy status to other drugs, medicaments and biological substances status: Secondary | ICD-10-CM | POA: Diagnosis not present

## 2017-08-02 DIAGNOSIS — Z885 Allergy status to narcotic agent status: Secondary | ICD-10-CM | POA: Diagnosis not present

## 2017-08-02 DIAGNOSIS — Z86718 Personal history of other venous thrombosis and embolism: Secondary | ICD-10-CM | POA: Diagnosis not present

## 2017-08-02 DIAGNOSIS — E119 Type 2 diabetes mellitus without complications: Secondary | ICD-10-CM | POA: Diagnosis not present

## 2017-08-02 DIAGNOSIS — I824Z1 Acute embolism and thrombosis of unspecified deep veins of right distal lower extremity: Secondary | ICD-10-CM | POA: Diagnosis not present

## 2017-08-02 DIAGNOSIS — I82401 Acute embolism and thrombosis of unspecified deep veins of right lower extremity: Secondary | ICD-10-CM | POA: Diagnosis not present

## 2017-08-02 DIAGNOSIS — Z7901 Long term (current) use of anticoagulants: Secondary | ICD-10-CM | POA: Diagnosis not present

## 2017-08-02 DIAGNOSIS — M7989 Other specified soft tissue disorders: Secondary | ICD-10-CM | POA: Diagnosis not present

## 2017-08-02 DIAGNOSIS — M79661 Pain in right lower leg: Secondary | ICD-10-CM | POA: Diagnosis not present

## 2017-08-02 DIAGNOSIS — Z79899 Other long term (current) drug therapy: Secondary | ICD-10-CM | POA: Diagnosis not present

## 2017-08-02 DIAGNOSIS — Z7982 Long term (current) use of aspirin: Secondary | ICD-10-CM | POA: Diagnosis not present

## 2017-08-02 DIAGNOSIS — Z96652 Presence of left artificial knee joint: Secondary | ICD-10-CM | POA: Diagnosis not present

## 2017-08-02 DIAGNOSIS — Z87891 Personal history of nicotine dependence: Secondary | ICD-10-CM | POA: Diagnosis not present

## 2017-08-02 DIAGNOSIS — I871 Compression of vein: Secondary | ICD-10-CM | POA: Diagnosis not present

## 2017-08-03 DIAGNOSIS — I82401 Acute embolism and thrombosis of unspecified deep veins of right lower extremity: Secondary | ICD-10-CM | POA: Diagnosis not present

## 2017-09-18 ENCOUNTER — Other Ambulatory Visit: Payer: Self-pay | Admitting: Physician Assistant

## 2017-09-18 DIAGNOSIS — E039 Hypothyroidism, unspecified: Secondary | ICD-10-CM

## 2017-11-02 ENCOUNTER — Other Ambulatory Visit: Payer: Self-pay | Admitting: Physician Assistant

## 2017-11-02 DIAGNOSIS — E559 Vitamin D deficiency, unspecified: Secondary | ICD-10-CM

## 2017-11-08 DIAGNOSIS — I82411 Acute embolism and thrombosis of right femoral vein: Secondary | ICD-10-CM | POA: Insufficient documentation

## 2017-11-09 DIAGNOSIS — I82411 Acute embolism and thrombosis of right femoral vein: Secondary | ICD-10-CM | POA: Diagnosis not present

## 2017-12-05 ENCOUNTER — Telehealth: Payer: Self-pay | Admitting: Physician Assistant

## 2017-12-11 NOTE — Telephone Encounter (Signed)
scheduled

## 2018-01-22 ENCOUNTER — Telehealth: Payer: Self-pay | Admitting: Physician Assistant

## 2018-01-22 ENCOUNTER — Encounter: Payer: Self-pay | Admitting: Physician Assistant

## 2018-01-22 ENCOUNTER — Ambulatory Visit (INDEPENDENT_AMBULATORY_CARE_PROVIDER_SITE_OTHER): Payer: Medicare Other | Admitting: Physician Assistant

## 2018-01-22 ENCOUNTER — Ambulatory Visit (INDEPENDENT_AMBULATORY_CARE_PROVIDER_SITE_OTHER): Payer: Medicare Other

## 2018-01-22 VITALS — BP 128/52 | HR 88 | Temp 98.4°F | Ht <= 58 in | Wt 117.0 lb

## 2018-01-22 DIAGNOSIS — E559 Vitamin D deficiency, unspecified: Secondary | ICD-10-CM | POA: Diagnosis not present

## 2018-01-22 DIAGNOSIS — Z780 Asymptomatic menopausal state: Secondary | ICD-10-CM | POA: Diagnosis not present

## 2018-01-22 DIAGNOSIS — Z1231 Encounter for screening mammogram for malignant neoplasm of breast: Secondary | ICD-10-CM

## 2018-01-22 DIAGNOSIS — E7800 Pure hypercholesterolemia, unspecified: Secondary | ICD-10-CM

## 2018-01-22 DIAGNOSIS — E119 Type 2 diabetes mellitus without complications: Secondary | ICD-10-CM

## 2018-01-22 DIAGNOSIS — Z0000 Encounter for general adult medical examination without abnormal findings: Secondary | ICD-10-CM

## 2018-01-22 DIAGNOSIS — E039 Hypothyroidism, unspecified: Secondary | ICD-10-CM

## 2018-01-22 DIAGNOSIS — M21621 Bunionette of right foot: Secondary | ICD-10-CM

## 2018-01-22 DIAGNOSIS — M205X1 Other deformities of toe(s) (acquired), right foot: Secondary | ICD-10-CM | POA: Diagnosis not present

## 2018-01-22 DIAGNOSIS — M810 Age-related osteoporosis without current pathological fracture: Secondary | ICD-10-CM

## 2018-01-22 DIAGNOSIS — L84 Corns and callosities: Secondary | ICD-10-CM

## 2018-01-22 DIAGNOSIS — Z1239 Encounter for other screening for malignant neoplasm of breast: Secondary | ICD-10-CM

## 2018-01-22 NOTE — Patient Instructions (Signed)
Health Maintenance for Postmenopausal Women Menopause is a normal process in which your reproductive ability comes to an end. This process happens gradually over a span of months to years, usually between the ages of 22 and 9. Menopause is complete when you have missed 12 consecutive menstrual periods. It is important to talk with your health care provider about some of the most common conditions that affect postmenopausal women, such as heart disease, cancer, and bone loss (osteoporosis). Adopting a healthy lifestyle and getting preventive care can help to promote your health and wellness. Those actions can also lower your chances of developing some of these common conditions. What should I know about menopause? During menopause, you may experience a number of symptoms, such as:  Moderate-to-severe hot flashes.  Night sweats.  Decrease in sex drive.  Mood swings.  Headaches.  Tiredness.  Irritability.  Memory problems.  Insomnia.  Choosing to treat or not to treat menopausal changes is an individual decision that you make with your health care provider. What should I know about hormone replacement therapy and supplements? Hormone therapy products are effective for treating symptoms that are associated with menopause, such as hot flashes and night sweats. Hormone replacement carries certain risks, especially as you become older. If you are thinking about using estrogen or estrogen with progestin treatments, discuss the benefits and risks with your health care provider. What should I know about heart disease and stroke? Heart disease, heart attack, and stroke become more likely as you age. This may be due, in part, to the hormonal changes that your body experiences during menopause. These can affect how your body processes dietary fats, triglycerides, and cholesterol. Heart attack and stroke are both medical emergencies. There are many things that you can do to help prevent heart disease  and stroke:  Have your blood pressure checked at least every 1-2 years. High blood pressure causes heart disease and increases the risk of stroke.  If you are 53-22 years old, ask your health care provider if you should take aspirin to prevent a heart attack or a stroke.  Do not use any tobacco products, including cigarettes, chewing tobacco, or electronic cigarettes. If you need help quitting, ask your health care provider.  It is important to eat a healthy diet and maintain a healthy weight. ? Be sure to include plenty of vegetables, fruits, low-fat dairy products, and lean protein. ? Avoid eating foods that are high in solid fats, added sugars, or salt (sodium).  Get regular exercise. This is one of the most important things that you can do for your health. ? Try to exercise for at least 150 minutes each week. The type of exercise that you do should increase your heart rate and make you sweat. This is known as moderate-intensity exercise. ? Try to do strengthening exercises at least twice each week. Do these in addition to the moderate-intensity exercise.  Know your numbers.Ask your health care provider to check your cholesterol and your blood glucose. Continue to have your blood tested as directed by your health care provider.  What should I know about cancer screening? There are several types of cancer. Take the following steps to reduce your risk and to catch any cancer development as early as possible. Breast Cancer  Practice breast self-awareness. ? This means understanding how your breasts normally appear and feel. ? It also means doing regular breast self-exams. Let your health care provider know about any changes, no matter how small.  If you are 40  or older, have a clinician do a breast exam (clinical breast exam or CBE) every year. Depending on your age, family history, and medical history, it may be recommended that you also have a yearly breast X-ray (mammogram).  If you  have a family history of breast cancer, talk with your health care provider about genetic screening.  If you are at high risk for breast cancer, talk with your health care provider about having an MRI and a mammogram every year.  Breast cancer (BRCA) gene test is recommended for women who have family members with BRCA-related cancers. Results of the assessment will determine the need for genetic counseling and BRCA1 and for BRCA2 testing. BRCA-related cancers include these types: ? Breast. This occurs in males or females. ? Ovarian. ? Tubal. This may also be called fallopian tube cancer. ? Cancer of the abdominal or pelvic lining (peritoneal cancer). ? Prostate. ? Pancreatic.  Cervical, Uterine, and Ovarian Cancer Your health care provider may recommend that you be screened regularly for cancer of the pelvic organs. These include your ovaries, uterus, and vagina. This screening involves a pelvic exam, which includes checking for microscopic changes to the surface of your cervix (Pap test).  For women ages 21-65, health care providers may recommend a pelvic exam and a Pap test every three years. For women ages 79-65, they may recommend the Pap test and pelvic exam, combined with testing for human papilloma virus (HPV), every five years. Some types of HPV increase your risk of cervical cancer. Testing for HPV may also be done on women of any age who have unclear Pap test results.  Other health care providers may not recommend any screening for nonpregnant women who are considered low risk for pelvic cancer and have no symptoms. Ask your health care provider if a screening pelvic exam is right for you.  If you have had past treatment for cervical cancer or a condition that could lead to cancer, you need Pap tests and screening for cancer for at least 20 years after your treatment. If Pap tests have been discontinued for you, your risk factors (such as having a new sexual partner) need to be  reassessed to determine if you should start having screenings again. Some women have medical problems that increase the chance of getting cervical cancer. In these cases, your health care provider may recommend that you have screening and Pap tests more often.  If you have a family history of uterine cancer or ovarian cancer, talk with your health care provider about genetic screening.  If you have vaginal bleeding after reaching menopause, tell your health care provider.  There are currently no reliable tests available to screen for ovarian cancer.  Lung Cancer Lung cancer screening is recommended for adults 69-62 years old who are at high risk for lung cancer because of a history of smoking. A yearly low-dose CT scan of the lungs is recommended if you:  Currently smoke.  Have a history of at least 30 pack-years of smoking and you currently smoke or have quit within the past 15 years. A pack-year is smoking an average of one pack of cigarettes per day for one year.  Yearly screening should:  Continue until it has been 15 years since you quit.  Stop if you develop a health problem that would prevent you from having lung cancer treatment.  Colorectal Cancer  This type of cancer can be detected and can often be prevented.  Routine colorectal cancer screening usually begins at  age 42 and continues through age 45.  If you have risk factors for colon cancer, your health care provider may recommend that you be screened at an earlier age.  If you have a family history of colorectal cancer, talk with your health care provider about genetic screening.  Your health care provider may also recommend using home test kits to check for hidden blood in your stool.  A small camera at the end of a tube can be used to examine your colon directly (sigmoidoscopy or colonoscopy). This is done to check for the earliest forms of colorectal cancer.  Direct examination of the colon should be repeated every  5-10 years until age 71. However, if early forms of precancerous polyps or small growths are found or if you have a family history or genetic risk for colorectal cancer, you may need to be screened more often.  Skin Cancer  Check your skin from head to toe regularly.  Monitor any moles. Be sure to tell your health care provider: ? About any new moles or changes in moles, especially if there is a change in a mole's shape or color. ? If you have a mole that is larger than the size of a pencil eraser.  If any of your family members has a history of skin cancer, especially at a young age, talk with your health care provider about genetic screening.  Always use sunscreen. Apply sunscreen liberally and repeatedly throughout the day.  Whenever you are outside, protect yourself by wearing long sleeves, pants, a wide-brimmed hat, and sunglasses.  What should I know about osteoporosis? Osteoporosis is a condition in which bone destruction happens more quickly than new bone creation. After menopause, you may be at an increased risk for osteoporosis. To help prevent osteoporosis or the bone fractures that can happen because of osteoporosis, the following is recommended:  If you are 46-71 years old, get at least 1,000 mg of calcium and at least 600 mg of vitamin D per day.  If you are older than age 55 but younger than age 65, get at least 1,200 mg of calcium and at least 600 mg of vitamin D per day.  If you are older than age 54, get at least 1,200 mg of calcium and at least 800 mg of vitamin D per day.  Smoking and excessive alcohol intake increase the risk of osteoporosis. Eat foods that are rich in calcium and vitamin D, and do weight-bearing exercises several times each week as directed by your health care provider. What should I know about how menopause affects my mental health? Depression may occur at any age, but it is more common as you become older. Common symptoms of depression  include:  Low or sad mood.  Changes in sleep patterns.  Changes in appetite or eating patterns.  Feeling an overall lack of motivation or enjoyment of activities that you previously enjoyed.  Frequent crying spells.  Talk with your health care provider if you think that you are experiencing depression. What should I know about immunizations? It is important that you get and maintain your immunizations. These include:  Tetanus, diphtheria, and pertussis (Tdap) booster vaccine.  Influenza every year before the flu season begins.  Pneumonia vaccine.  Shingles vaccine.  Your health care provider may also recommend other immunizations. This information is not intended to replace advice given to you by your health care provider. Make sure you discuss any questions you have with your health care provider. Document Released: 11/11/2005  Document Revised: 04/08/2016 Document Reviewed: 06/23/2015 Elsevier Interactive Patient Education  2018 [employer] Inc[employer]  

## 2018-01-22 NOTE — Progress Notes (Addendum)
Subjective:   Sherry Jones is a 77 y.o. female who presents for Medicare Annual (Subsequent) preventive examination.  Review of Systems:  N/A  Cardiac Risk Factors include: advanced age (>75men, >24 women);diabetes mellitus;dyslipidemia     Objective:     Vitals: BP (!) 128/52 (BP Location: Left Arm)   Pulse 88   Temp 98.4 F (36.9 C) (Oral)   Ht 4\' 6"  (1.372 m)   Wt 117 lb (53.1 kg)   BMI 28.21 kg/m   Body mass index is 28.21 kg/m.  Advanced Directives 01/22/2018 01/20/2017 08/04/2016 08/03/2016 01/20/2016 10/15/2015 08/15/2015  Does Patient Have a Medical Advance Directive? No No - Yes No No No  Type of Advance Directive - Programmer, multimedia of Attorney - - -  Does patient want to make changes to medical advance directive? - - No - Patient declined No - Patient declined - - -  Copy of [place] in Chart? - - No - copy requested Yes - - -  Would patient like information on creating a medical advance directive? No - Patient declined No - Patient declined - - - No - patient declined information -    Tobacco Social History   Tobacco Use  Smoking Status Former Smoker  . Years: 36.00  . Last attempt to quit: 01/02/1996  . Years since quitting: 22.0  Smokeless Tobacco Never Used     Counseling given: Not Answered   Clinical Intake:  Pre-visit preparation completed: Yes  Pain : No/denies pain(Does have chronic arthritis pain intermittenly.) Pain Score: 0-No pain     Nutritional Status: BMI 25 -29 Overweight Nutritional Risks: None Diabetes: Yes(type 2) CBG done?: No Did pt. bring in CBG monitor from home?: No  How often do you need to have someone help you when you read instructions, pamphlets, or other written materials from your doctor or pharmacy?: 1 - Never  Interpreter Needed?: No  Information entered by :: [facility], LPN  Past Medical History:  Diagnosis Date  . Cancer ([place])    uterine  . Diabetes  mellitus without complication ([place])   . Hypercholesterolemia   . Thyroid disease    Past Surgical History:  Procedure Laterality Date  . ABDOMINAL HYSTERECTOMY    . CATARACT EXTRACTION    . REPLACEMENT TOTAL KNEE     left knee  . ROTATOR CUFF REPAIR Left    Family History  Problem Relation Age of Onset  . Alzheimer's disease Mother   . Diabetes Brother   . Osteoarthritis Brother   . Hypertension Son   . Heart attack Son        stents placed   Social History   Socioeconomic History  . Marital status: Widowed    Spouse name: Not on file  . Number of children: 3  . Years of education: Not on file  . Highest education level: Some college, no degree  Occupational History  . Occupation: retired  Scientific laboratory technician  . Financial resource strain: Not hard at all  . Food insecurity:    Worry: Never true    Inability: Never true  . Transportation needs:    Medical: No    Non-medical: No  Tobacco Use  . Smoking status: Former Smoker    Years: 36.00    Last attempt to quit: 01/02/1996    Years since quitting: 22.0  . Smokeless tobacco: Never Used  Substance and Sexual Activity  . Alcohol use: No  . Drug  use: No  . Sexual activity: Never  Lifestyle  . Physical activity:    Days per week: Not on file    Minutes per session: Not on file  . Stress: Not at all  Relationships  . Social connections:    Talks on phone: Not on file    Gets together: Not on file    Attends religious service: Not on file    Active member of club or organization: Not on file    Attends meetings of clubs or organizations: Not on file    Relationship status: Not on file  Other Topics Concern  . Not on file  Social History Narrative   Oldest son was killed in 2004 from getting hit by a train.     Outpatient Encounter Medications as of 01/22/2018  Medication Sig  . calcium carbonate (OS-CAL) 600 MG TABS tablet Take 1 tablet (600 mg total) by mouth 2 (two) times daily with a meal.  . levothyroxine  (SYNTHROID, LEVOTHROID) 75 MCG tablet TAKE 1 TABLET BY MOUTH EVERY DAY  . simvastatin (ZOCOR) 10 MG tablet Take 1 tablet (10 mg total) by mouth at bedtime.  . Vitamin D, Ergocalciferol, (DRISDOL) 50000 units CAPS capsule TAKE 1 CAPSULE (50,000 UNITS TOTAL) BY MOUTH EVERY 30 (THIRTY) DAYS.   No facility-administered encounter medications on file as of 01/22/2018.     Activities of Daily Living In your present state of health, do you have any difficulty performing the following activities: 01/22/2018  Hearing? N  Vision? N  Difficulty concentrating or making decisions? N  Walking or climbing stairs? Y  Comment Due to left knee replacement- uses a cain for assistance.   Dressing or bathing? N  Doing errands, shopping? N  Preparing Food and eating ? N  Using the Toilet? N  In the past six months, have you accidently leaked urine? N  Do you have problems with loss of bowel control? N  Managing your Medications? N  Managing your Finances? N  Housekeeping or managing your Housekeeping? N  Some recent data might be hidden    Patient Care Team: Mar Daring, PA-C as PCP - General (Family Medicine) Delgaizo, Lavon Paganini, MD as Referring Physician (Orthopedic Surgery) Key, Kelli Churn, MD as Referring Physician (Oncology)    Assessment:   This is a routine wellness examination for Sherry Jones.  Exercise Activities and Dietary recommendations Current Exercise Habits: Home exercise routine, Type of exercise: stretching, Time (Minutes): 15, Frequency (Times/Week): 7, Weekly Exercise (Minutes/Week): 105, Intensity: Mild, Exercise limited by: orthopedic condition(s)  Goals    . Prevent falls     Discussed ways to avoid falls inside and outside the house.        Fall Risk Fall Risk  01/22/2018 01/20/2017 01/20/2016  Falls in the past year? Yes Yes Yes  Number falls in past yr: 2 or more 1 1  Injury with Fall? No Yes Yes  Comment - will have knee surgery in the future -  Risk for fall due to :  Other (Comment) - -  Risk for fall due to: Comment prior to total knee replacement in 05/2018 - -  Follow up Falls prevention discussed Falls prevention discussed -   Is the patient's home free of loose throw rugs in walkways, pet beds, electrical cords, etc?   yes      Grab bars in the bathroom? yes      Handrails on the stairs?   yes      Adequate lighting?  yes  Timed Get Up and Go performed: N/A  Depression Screen PHQ 2/9 Scores 01/22/2018 01/20/2017 01/20/2016  PHQ - 2 Score 0 0 0     Cognitive Function: Pt declined screening today.      6CIT Screen 01/20/2017  What Year? 0 points  What month? 0 points  What time? 0 points  Count back from 20 0 points  Months in reverse 0 points  Repeat phrase 8 points  Total Score 8    Immunization History  Administered Date(s) Administered  . Influenza, High Dose Seasonal PF 09/03/2014, 08/09/2016  . Influenza,inj,Quad PF,6+ Mos 06/24/2013  . Pneumococcal Conjugate-13 05/14/2014  . Pneumococcal Polysaccharide-23 05/27/2011  . Td 10/03/1997  . Tdap 05/27/2011, 11/21/2015  . Zoster 02/24/2012    Qualifies for Shingles Vaccine? Due for Shingles vaccine. Declined my offer to administer today. Education has been provided regarding the importance of this vaccine. Pt has been advised to call her insurance company to determine her out of pocket expense. Advised she may also receive this vaccine at her local pharmacy or Health Dept. Verbalized acceptance and understanding.  Screening Tests Health Maintenance  Topic Date Due  . FOOT EXAM  10/22/1950  . URINE MICROALBUMIN  10/22/1950  . OPHTHALMOLOGY EXAM  06/03/2017  . HEMOGLOBIN A1C  07/22/2017  . INFLUENZA VACCINE  05/03/2018  . TETANUS/TDAP  11/20/2025  . DEXA SCAN  Completed  . PNA vac Low Risk Adult  Completed    Cancer Screenings: Lung: Low Dose CT Chest recommended if Age 71-80 years, 30 pack-year currently smoking OR have quit w/in 15years. Patient does not qualify. Breast:   Up to date on Mammogram? Yes   Up to date of Bone Density/Dexa? Yes Colorectal: Up to date  Additional Screenings:  Hepatitis C Screening: N/A     Plan:  I have personally reviewed and addressed the Medicare Annual Wellness questionnaire and have noted the following in the patient's chart:  A. Medical and social history B. Use of alcohol, tobacco or illicit drugs  C. Current medications and supplements D. Functional ability and status E.  Nutritional status F.  Physical activity G. Advance directives H. List of other physicians I.  Hospitalizations, surgeries, and ER visits in previous 12 months J.  [place] such as hearing and vision if needed, cognitive and depression L. Referrals and appointments - none  In addition, I have reviewed and discussed with patient certain preventive protocols, quality metrics, and best practice recommendations. A written personalized care plan for preventive services as well as general preventive health recommendations were provided to patient.  See attached scanned questionnaire for additional information.   Signed,  Fabio Neighbors, LPN Nurse Health Advisor   Nurse Recommendations: Pt needs a diabetic foot exam, Hgb A1c checked, and a urine microalbumin completed today. Pt plans to have an eye exam in 06/2018.

## 2018-01-22 NOTE — Progress Notes (Signed)
Patient: Sherry Jones, Female    DOB: May 16, 1941, 77 y.o.   MRN: [id number] Visit Date: 01/22/2018  Today's Provider: Mar Daring, PA-C   No chief complaint on file.  Subjective:    Annual wellness visit Sherry Jones is a 77 y.o. female. She feels well. She reports exercising occasionally. She reports she is sleeping well.  Colonoscopy- 01/03/2008. Internal hemorrhoids. Mammogram- 03/13/2017. Normal.  BMD- 03/13/2017. Osteoporosis. Started on high dose Vitamin D and added Calcium supplement. Repeat BMD in 1 year. If not better, will need to add bisphosphonate.   Right foot pain: Patient c/o pain that has worsened in her right foot X 6 months. She reports that it is beginning to be painful. She has tried insoles without relief.   Review of Systems  Constitutional: Negative.   HENT: Negative.   Eyes: Negative.   Respiratory: Negative.   Cardiovascular: Negative.   Gastrointestinal: Negative.   Endocrine: Negative.   Genitourinary: Negative.   Musculoskeletal: Negative.   Skin: Negative.   Allergic/Immunologic: Negative.   Neurological: Negative.   Hematological: Negative.   Psychiatric/Behavioral: Negative.     Social History   Socioeconomic History  . Marital status: Widowed    Spouse name: Not on file  . Number of children: 3  . Years of education: Not on file  . Highest education level: Some college, no degree  Occupational History  . Occupation: retired  Scientific laboratory technician  . Financial resource strain: Not hard at all  . Food insecurity:    Worry: Never true    Inability: Never true  . Transportation needs:    Medical: No    Non-medical: No  Tobacco Use  . Smoking status: Former Smoker    Years: 36.00    Last attempt to quit: 01/02/1996    Years since quitting: 22.0  . Smokeless tobacco: Never Used  Substance and Sexual Activity  . Alcohol use: No  . Drug use: No  . Sexual activity: Never  Lifestyle  . Physical activity:    Days per week:  Not on file    Minutes per session: Not on file  . Stress: Not at all  Relationships  . Social connections:    Talks on phone: Not on file    Gets together: Not on file    Attends religious service: Not on file    Active member of club or organization: Not on file    Attends meetings of clubs or organizations: Not on file    Relationship status: Not on file  . Intimate partner violence:    Fear of current or ex partner: Not on file    Emotionally abused: Not on file    Physically abused: Not on file    Forced sexual activity: Not on file  Other Topics Concern  . Not on file  Social History Narrative   Oldest son was killed in 2004 from getting hit by a train.     Past Medical History:  Diagnosis Date  . Cancer ([place])    uterine  . Diabetes mellitus without complication ([place])   . Hypercholesterolemia   . Thyroid disease      Patient Active Problem List   Diagnosis Date Noted  . H/O non-Hodgkin's lymphoma 01/23/2017  . History of deep vein thrombosis (DVT) of lower extremity 01/23/2017  . Back pain 08/04/2016  . Diabetes ([place]) 08/04/2016  . Protein-calorie malnutrition, severe 08/04/2016  . Gonalgia 08/25/2015  . Contact dermatitis 08/11/2015  .  Atypical nevus 05/29/2015  . Shoulder pain, left 05/29/2015  . Hypercholesteremia 05/26/2015  . 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 H/O ear disorder 06/04/2014  . History of uterine cancer 06/04/2014  . Avitaminosis D 11/20/2009  . Acquired hypothyroidism 11/26/2007  . Barrett esophagus 11/23/2007  . Narrowing of intervertebral disc space 11/23/2007  . Allergic rhinitis 11/22/2007  . Arthritis, degenerative 11/22/2007  . Disease of airway 11/22/2007    Past Surgical History:  Procedure Laterality Date  . ABDOMINAL HYSTERECTOMY    . CATARACT EXTRACTION    . REPLACEMENT TOTAL KNEE     left knee  . ROTATOR CUFF REPAIR Left     Her family history  includes Alzheimer's disease in her mother; Diabetes in her brother; Heart attack in her son; Hypertension in her son; Osteoarthritis in her brother.      Current Outpatient Medications:  .  calcium carbonate (OS-CAL) 600 MG TABS tablet, Take 1 tablet (600 mg total) by mouth 2 (two) times daily with a meal., Disp: 60 tablet, Rfl: 11 .  levothyroxine (SYNTHROID, LEVOTHROID) 75 MCG tablet, TAKE 1 TABLET BY MOUTH EVERY DAY, Disp: 90 tablet, Rfl: 1 .  simvastatin (ZOCOR) 10 MG tablet, Take 1 tablet (10 mg total) by mouth at bedtime., Disp: 90 tablet, Rfl: 3 .  Vitamin D, Ergocalciferol, (DRISDOL) 50000 units CAPS capsule, TAKE 1 CAPSULE (50,000 UNITS TOTAL) BY MOUTH EVERY 30 (THIRTY) DAYS., Disp: 12 capsule, Rfl: 0  Patient Care Team: Mar Daring, PA-C as PCP - General (Family Medicine) Delgaizo, Lavon Paganini, MD as Referring Physician (Orthopedic Surgery) Key, Kelli Churn, MD as Referring Physician (Oncology)     Objective:   Vitals:    BP 128/52 (BP Location: Left Arm)    Pulse 88    Temp 98.4 F (36.9 C) (Oral)    Ht 4\' 6"  (1.372 m)    Wt 117 lb (53.1 kg)    BMI 28.21 kg/m    BSA 1.42 m         Physical Exam  Activities of Daily Living In your present state of health, do you have any difficulty performing the following activities: 01/22/2018  Hearing? N  Vision? N  Difficulty concentrating or making decisions? N  Walking or climbing stairs? Y  Comment Due to left knee replacement- uses a cain for assistance.   Dressing or bathing? N  Doing errands, shopping? N  Preparing Food and eating ? N  Using the Toilet? N  In the past six months, have you accidently leaked urine? N  Do you have problems with loss of bowel control? N  Managing your Medications? N  Managing your Finances? N  Housekeeping or managing your Housekeeping? N  Some recent data might be hidden    Fall Risk Assessment Fall Risk  01/22/2018 01/20/2017 01/20/2016  Falls in the past year? Yes Yes  Yes  Number falls in past yr: 2 or more 1 1  Injury with Fall? No Yes Yes  Comment - will have knee surgery in the future -  Risk for fall due to : Other (Comment) - -  Risk for fall due to: Comment prior to total knee replacement in 05/2018 - -  Follow up Falls prevention discussed Falls prevention discussed -     Depression Screen PHQ 2/9 Scores 01/22/2018 01/20/2017 01/20/2016  PHQ - 2 Score 0 0 0    Cognitive Testing - 6-CIT Cognitive Function: Pt declined screening  today.      Assessment & Plan:     Annual Wellness Visit  Reviewed patient's Family Medical History Reviewed and updated list of patient's medical providers Assessment of cognitive impairment was done Assessed patient's functional ability Established a written schedule for health screening [place] Completed and Reviewed  Exercise Activities and Dietary recommendations Goals    . Prevent falls     Discussed ways to avoid falls inside and outside the house.        Immunization History  Administered Date(s) Administered  . Influenza, High Dose Seasonal PF 09/03/2014, 08/09/2016  . Influenza,inj,Quad PF,6+ Mos 06/24/2013  . Pneumococcal Conjugate-13 05/14/2014  . Pneumococcal Polysaccharide-23 05/27/2011  . Td 10/03/1997  . Tdap 05/27/2011, 11/21/2015  . Zoster 02/24/2012    Health Maintenance  Topic Date Due  . FOOT EXAM  10/22/1950  . URINE MICROALBUMIN  10/22/1951  . OPHTHALMOLOGY EXAM  06/03/2017  . HEMOGLOBIN A1C  07/22/2017  . INFLUENZA VACCINE  05/03/2018  . TETANUS/TDAP  11/20/2025  . DEXA SCAN  Completed  . PNA vac Low Risk Adult  Completed     Discussed health benefits of physical activity, and encouraged her to engage in regular exercise appropriate for her age and condition.   1. Annual physical exam Normal physical exam today. Will check labs as below and f/u pending lab results. If labs are stable and WNL she will not need to have these rechecked for one  year at her next annual physical exam. She is to call the office in the meantime if she has any acute issue, questions or concerns.  2. Breast cancer screening There is no family history of breast cancer. She does perform regular self breast exams. Mammogram was ordered as below. Information for [facility] was given to patient so she may schedule her mammogram at her convenience. - MM Digital Screening; Future  3. Bunionette of right foot Noted on exam. Tenderness with shoes. 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 Also has callus noted of medial side of great toe. No ulceration noted. Also noted that 2nd toe is crossing over top of right great toe. No noted bunion on medial side. Has intermittent neuropathic pain. Advised to use tylenol prn. Discussed wearing shoes with wider toe box. Referral placed to podiatry for further evaluation.  - Ambulatory referral to Podiatry  4. Crossover toe deformity of right foot See above medical treatment plan.  5. Callus of foot See above medical treatment plan. - Ambulatory referral to Podiatry  6. Type 2 diabetes mellitus without complication, without long-term current use of insulin (HCC) Diet controlled. Will check labs as below and f/u pending results. - CBC w/Diff/Platelet - Comprehensive Metabolic Panel (CMET) - Lipid Profile - HgB A1c  7. Acquired hypothyroidism Stable on levothyroxine 31mcg. Will check labs as below and f/u pending results. - CBC w/Diff/Platelet - Comprehensive Metabolic Panel (CMET) - TSH  8. Avitaminosis D H/O this and postmenopausal. Has been on high dose supplementation monthly. Will check labs as below and f/u pending results. - CBC w/Diff/Platelet - Comprehensive Metabolic Panel (CMET) - Vitamin D (25 hydroxy)  9. Hypercholesteremia Stable. Continue simvastatin 10mg . Will  check labs as below and f/u pending results. - CBC w/Diff/Platelet - Comprehensive Metabolic Panel (CMET) - Lipid Profile - HgB A1c  10. Age-related osteoporosis without current pathological fracture T score of -2.5  last year. Patient is high risk for osteoporotic fracture with frequent falls due to gait instability. Not on treatment for osteoporosis except for conservative management with Vit D once monthly, calcium supplement and walking daily. Will check to see if progressing.  - DG Bone Density; Future  11. Postmenopausal estrogen deficiency See above medical treatment plan. - DG Bone Density; Future   Mar Daring, PA-C  [facility]

## 2018-01-22 NOTE — Telephone Encounter (Signed)
Pt stated she was returning a missed and thought it might be about her referral appt to [facility]. Please advise. Thanks TNP

## 2018-01-22 NOTE — Patient Instructions (Addendum)
[name] , Thank you for taking time to come for your Medicare Wellness Visit. I appreciate your ongoing commitment to your health goals. Please review the following plan we discussed and let me know if I can assist you in the future.   Screening recommendations/referrals: Colonoscopy: Up to date Mammogram: Up to date Bone Density: Up to date Recommended yearly ophthalmology/optometry visit for glaucoma screening and checkup Recommended yearly dental visit for hygiene and checkup  Vaccinations: Influenza vaccine: N/A Pneumococcal vaccine: Up to date Tdap vaccine: Up to date Shingles vaccine: Pt declines today.     Advanced directives: Advance directive discussed with you today. Even though you declined this today please call our office should you change your mind and we can give you the proper paperwork for you to fill out.  Conditions/risks identified: Fall risk prevention.   Next appointment: 11:00 AM today with Fenton Malling.    Preventive Care 34 Years and Older, Female Preventive care refers to lifestyle choices and visits with your health care provider that can promote health and wellness. What does preventive care include?  A yearly physical exam. This is also called an annual well check.  Dental exams once or twice a year.  Routine eye exams. Ask your health care provider how often you should have your eyes checked.  Personal lifestyle choices, including:  Daily care of your teeth and gums.  Regular physical activity.  Eating a healthy diet.  Avoiding tobacco and drug use.  Limiting alcohol use.  Practicing safe sex.  Taking low-dose aspirin every day.  Taking vitamin and mineral supplements as recommended by your health care provider. What happens during an annual well check? The services and screenings done by your health care provider during your annual well check will depend on your age, overall health, lifestyle risk factors, and family history of  disease. Counseling  Your health care provider may ask you questions about your:  Alcohol use.  Tobacco use.  Drug use.  Emotional well-being.  Home and relationship well-being.  Sexual activity.  Eating habits.  History of falls.  Memory and ability to understand (cognition).  Work and work Statistician.  Reproductive health. Screening  You may have the following tests or measurements:  Height, weight, and BMI.  Blood pressure.  Lipid and cholesterol levels. These may be checked every 5 years, or more frequently if you are over 33 years old.  Skin check.  Lung cancer screening. You may have this screening every year starting at age 24 if you have a 30-pack-year history of smoking and currently smoke or have quit within the past 15 years.  Fecal occult blood test (FOBT) of the stool. You may have this test every year starting at age 76.  Flexible sigmoidoscopy or colonoscopy. You may have a sigmoidoscopy every 5 years or a colonoscopy every 10 years starting at age 70.  Hepatitis C blood test.  Hepatitis B blood test.  Sexually transmitted disease (STD) testing.  Diabetes screening. This is done by checking your blood sugar (glucose) after you have not eaten for a while (fasting). You may have this done every 1-3 years.  Bone density scan. This is done to screen for osteoporosis. You may have this done starting at age 82.  Mammogram. This may be done every 1-2 years. Talk to your health care provider about how often you should have regular mammograms. Talk with your health care provider about your test results, treatment options, and if necessary, the need for more tests.  Vaccines  Your health care provider may recommend certain vaccines, such as:  Influenza vaccine. This is recommended every year.  Tetanus, diphtheria, and acellular pertussis (Tdap, Td) vaccine. You may need a Td booster every 10 years.  Zoster vaccine. You may need this after age  35.  Pneumococcal 13-valent conjugate (PCV13) vaccine. One dose is recommended after age 45.  Pneumococcal polysaccharide (PPSV23) vaccine. One dose is recommended after age 34. Talk to your health care provider about which screenings and vaccines you need and how often you need them. This information is not intended to replace advice given to you by your health care provider. Make sure you discuss any questions you have with your health care provider. Document Released: 10/16/2015 Document Revised: 06/08/2016 Document Reviewed: 07/21/2015 Elsevier Interactive Patient Education  2017 [place] Prevention in the Home Falls can cause injuries. They can happen to people of all ages. There are many things you can do to make your home safe and to help prevent falls. What can I do on the outside of my home?  Regularly fix the edges of walkways and driveways and fix any cracks.  Remove anything that might make you trip as you walk through a door, such as a raised step or threshold.  Trim any bushes or trees on the path to your home.  Use bright outdoor lighting.  Clear any walking paths of anything that might make someone trip, such as rocks or tools.  Regularly check to see if handrails are loose or broken. Make sure that both sides of any steps have handrails.  Any raised decks and porches should have guardrails on the edges.  Have any leaves, snow, or ice cleared regularly.  Use sand or salt on walking paths during winter.  Clean up any spills in your garage right away. This includes oil or grease spills. What can I do in the bathroom?  Use night lights.  Install grab bars by the toilet and in the tub and shower. Do not use towel bars as grab bars.  Use non-skid mats or decals in the tub or shower.  If you need to sit down in the shower, use a plastic, non-slip stool.  Keep the floor dry. Clean up any water that spills on the floor as soon as it happens.  Remove  soap buildup in the tub or shower regularly.  Attach bath mats securely with double-sided non-slip rug tape.  Do not have throw rugs and other things on the floor that can make you trip. What can I do in the bedroom?  Use night lights.  Make sure that you have a light by your bed that is easy to reach.  Do not use any sheets or blankets that are too big for your bed. They should not hang down onto the floor.  Have a firm chair that has side arms. You can use this for support while you get dressed.  Do not have throw rugs and other things on the floor that can make you trip. What can I do in the kitchen?  Clean up any spills right away.  Avoid walking on wet floors.  Keep items that you use a lot in easy-to-reach places.  If you need to reach something above you, use a strong step stool that has a grab bar.  Keep electrical cords out of the way.  Do not use floor polish or wax that makes floors slippery. If you must use wax, use non-skid floor wax.  Do not have throw rugs and other things on the floor that can make you trip. What can I do with my stairs?  Do not leave any items on the stairs.  Make sure that there are handrails on both sides of the stairs and use them. Fix handrails that are broken or loose. Make sure that handrails are as long as the stairways.  Check any carpeting to make sure that it is firmly attached to the stairs. Fix any carpet that is loose or worn.  Avoid having throw rugs at the top or bottom of the stairs. If you do have throw rugs, attach them to the floor with carpet tape.  Make sure that you have a light switch at the top of the stairs and the bottom of the stairs. If you do not have them, ask someone to add them for you. What else can I do to help prevent falls?  Wear shoes that:  Do not have high heels.  Have rubber bottoms.  Are comfortable and fit you well.  Are closed at the toe. Do not wear sandals.  If you use a  stepladder:  Make sure that it is fully opened. Do not climb a closed stepladder.  Make sure that both sides of the stepladder are locked into place.  Ask someone to hold it for you, if possible.  Clearly mark and make sure that you can see:  Any grab bars or handrails.  First and last steps.  Where the edge of each step is.  Use tools that help you move around (mobility aids) if they are needed. These include:  Canes.  Walkers.  Scooters.  Crutches.  Turn on the lights when you go into a dark area. Replace any light bulbs as soon as they burn out.  Set up your furniture so you have a clear path. Avoid moving your furniture around.  If any of your floors are uneven, fix them.  If there are any pets around you, be aware of where they are.  Review your medicines with your doctor. Some medicines can make you feel dizzy. This can increase your chance of falling. Ask your doctor what other things that you can do to help prevent falls. This information is not intended to replace advice given to you by your health care provider. Make sure you discuss any questions you have with your health care provider. Document Released: 07/16/2009 Document Revised: 02/25/2016 Document Reviewed: 10/24/2014 Elsevier Interactive Patient Education  2017 [employer].

## 2018-02-05 ENCOUNTER — Ambulatory Visit: Payer: Medicare Other | Admitting: Podiatry

## 2018-02-05 ENCOUNTER — Encounter: Payer: Self-pay | Admitting: Podiatry

## 2018-02-05 ENCOUNTER — Other Ambulatory Visit: Payer: Self-pay | Admitting: Podiatry

## 2018-02-05 ENCOUNTER — Ambulatory Visit (INDEPENDENT_AMBULATORY_CARE_PROVIDER_SITE_OTHER): Payer: Medicare Other

## 2018-02-05 VITALS — BP 142/64 | HR 85 | Resp 16

## 2018-02-05 DIAGNOSIS — M7752 Other enthesopathy of left foot: Secondary | ICD-10-CM | POA: Diagnosis not present

## 2018-02-05 DIAGNOSIS — M2012 Hallux valgus (acquired), left foot: Secondary | ICD-10-CM

## 2018-02-05 DIAGNOSIS — M7751 Other enthesopathy of right foot: Secondary | ICD-10-CM | POA: Diagnosis not present

## 2018-02-06 NOTE — Progress Notes (Signed)
Subjective:  Patient ID: Sherry Jones, female    DOB: Dec 04, 1940,  MRN: [id number] HPI Chief Complaint  Patient presents with  . Foot Pain    2nd toe and 5th MPJ right - acing x several months, 2nd toe overlapping 1st toe, 5th MPJ red and swollen, shoes make worse  . New Patient (Initial Visit)    77 y.o. female presents with the above complaint.   Review of systems: Denies fever chills nausea vomiting muscle aches pains calf pain back pain chest pain shortness of breath and headache.  Past Medical History:  Diagnosis Date  . Cancer ([place])    uterine  . Diabetes mellitus without complication ([place])   . Hypercholesterolemia   . Thyroid disease    Past Surgical History:  Procedure Laterality Date  . ABDOMINAL HYSTERECTOMY    . CATARACT EXTRACTION    . REPLACEMENT TOTAL KNEE     left knee  . ROTATOR CUFF REPAIR Left     Current Outpatient Medications:  .  calcium carbonate (OS-CAL) 600 MG TABS tablet, Take 1 tablet (600 mg total) by mouth 2 (two) times daily with a meal., Disp: 60 tablet, Rfl: 11 .  [facility] CALCIUM 600 MG tablet, Take 1 tablet by mouth 2 (two) times daily with a meal., Disp: , Rfl: 11 .  levothyroxine (SYNTHROID, LEVOTHROID) 75 MCG tablet, TAKE 1 TABLET BY MOUTH EVERY DAY, Disp: 90 tablet, Rfl: 1 .  simvastatin (ZOCOR) 10 MG tablet, Take 1 tablet (10 mg total) by mouth at bedtime., Disp: 90 tablet, Rfl: 3 .  Vitamin D, Ergocalciferol, (DRISDOL) 50000 units CAPS capsule, TAKE 1 CAPSULE (50,000 UNITS TOTAL) BY MOUTH EVERY 30 (THIRTY) DAYS., Disp: 12 capsule, Rfl: 0 .  XARELTO 10 MG TABS tablet, TO BEGIN ON November 09, 2017 TAKING 1 TAB BY MOUTH DAILY AS DIRECTED, Disp: , Rfl: 3  Allergies  Allergen Reactions  . Cefadroxil   . Cefuroxime   . Cefuroxime Axetil     Other reaction(s): Unknown Other reaction(s): NAUSEA  . Cephalosporins Other (See Comments)  . Ciprofloxacin Other (See Comments)  . Codeine Swelling  . Hydrocodone-Acetaminophen Other (See Comments)      Other reaction(s): Other (See Comments) Other reaction(s): Unknown Other reaction(s): Other (See Comments)  . Levofloxacin Other (See Comments)  . Meloxicam   . Oxycodone     Other reaction(s): Syncope  . Tramadol     Other reaction(s): Unknown  . Gabapentin Nausea And Vomiting  . Macrolides And Ketolides     Other reaction(s): Other (See Comments) Other Reaction: Intolerance Other reaction(s): Other (See Comments) Other Reaction: Intolerance Other reaction(s): Other (See Comments) Other Reaction: Intolerance Other reaction(s): Other (See Comments) Other Reaction: Intolerance  . Methocarbamol Rash  . Nitrofuran Derivatives Other (See Comments)    Other Reaction: Intolerance  . Nitrofurantoin     Other reaction(s): Other (See Comments) Other Reaction: Intolerance Other reaction(s): Other (See Comments) Other Reaction: Intolerance   Review of Systems Objective:   Vitals:   02/05/18 1410  BP: (!) 142/64  Pulse: 85  Resp: 16    General: Well developed, nourished, in no acute distress, alert and oriented x3   Dermatological: Skin is warm, dry and supple bilateral. Nails x 10 are well maintained; remaining integument appears unremarkable at this time. There are no open sores, no preulcerative lesions, no rash or signs of infection present.  Vascular: Dorsalis Pedis artery and Posterior Tibial artery pedal pulses are 2/4 bilateral with immedate capillary fill time. Pedal hair  growth present. No varicosities and no lower extremity edema present bilateral.   Neruologic: Grossly intact via light touch bilateral. Vibratory intact via tuning fork bilateral. Protective threshold with Semmes Wienstein monofilament intact to all pedal sites bilateral. Patellar and Achilles deep tendon reflexes 2+ bilateral. No Babinski or clonus noted bilateral.   Musculoskeletal: No gross boney pedal deformities bilateral. No pain, crepitus, or limitation noted with foot and ankle range of motion  bilateral. Muscular strength 5/5 in all groups tested bilateral.  Tenderness bunion deformity fifth right with an inflamed bursa to the plantar lateral aspect of the fifth metatarsal phalangeal joint.  She has adductovarus of all of the lesser digits.  Some overlapping of the second toe and hallux.  This is primarily due to tailor's bunion deformity I feel.  Gait: Unassisted, Nonantalgic.    Radiographs:  Radiographs demonstrate no acute findings tailor's bunion deformity is visible.  No fractures are identified overlapping of the second toe upon the hallux.  Assessment & Plan:   Assessment: Tailor's bunion deformity with capsulitis bursitis fifth metatarsal phalangeal joint right foot.  Plan: Discussed appropriate shoe gear.  I injected the area today after alcohol prep with 2 mg of dexamethasone and local anesthetic to the bursa.  Should this not alleviate her symptoms we did discuss the possible need for surgical intervention she is aware of this and I will follow-up with her on an as-needed basis.     Max T. Addison, [place]

## 2018-02-23 ENCOUNTER — Other Ambulatory Visit: Payer: Self-pay | Admitting: Family Medicine

## 2018-02-23 DIAGNOSIS — E7800 Pure hypercholesterolemia, unspecified: Secondary | ICD-10-CM

## 2018-02-27 NOTE — Telephone Encounter (Signed)
Up to you

## 2018-03-14 ENCOUNTER — Other Ambulatory Visit: Payer: Medicare Other

## 2018-03-20 ENCOUNTER — Other Ambulatory Visit: Payer: Self-pay | Admitting: Physician Assistant

## 2018-03-20 DIAGNOSIS — E039 Hypothyroidism, unspecified: Secondary | ICD-10-CM

## 2018-04-11 ENCOUNTER — Ambulatory Visit
Admission: RE | Admit: 2018-04-11 | Discharge: 2018-04-11 | Disposition: A | Discharge status: Home / Self Care | Payer: Medicare Other | Source: Ambulatory Visit | Attending: Physician Assistant | Admitting: Physician Assistant

## 2018-04-11 ENCOUNTER — Telehealth: Payer: Self-pay

## 2018-04-11 DIAGNOSIS — M810 Age-related osteoporosis without current pathological fracture: Secondary | ICD-10-CM | POA: Diagnosis not present

## 2018-04-11 DIAGNOSIS — Z1239 Encounter for other screening for malignant neoplasm of breast: Secondary | ICD-10-CM

## 2018-04-11 DIAGNOSIS — Z780 Asymptomatic menopausal state: Secondary | ICD-10-CM

## 2018-04-11 DIAGNOSIS — Z1231 Encounter for screening mammogram for malignant neoplasm of breast: Secondary | ICD-10-CM | POA: Insufficient documentation

## 2018-04-11 NOTE — Telephone Encounter (Signed)
[email or web address]

## 2018-04-11 NOTE — Telephone Encounter (Signed)
Patient advised as below. 04/19/18

## 2018-04-11 NOTE — Telephone Encounter (Signed)
-----   Message from Mar Daring, [place] sent at 04/11/2018  3:05 PM EDT ----- Normal mammogram. Repeat screening in one year.

## 2018-04-11 NOTE — Telephone Encounter (Signed)
-----   Message from Mar Daring, [place] sent at 04/11/2018  3:05 PM EDT ----- Bone density decreased slightly from -2.5 last year to -2.8 now. If desires to start medication for osteoporosis she may come in to discuss options. If not, continue her calcium and Vit D OTC as she has been doing.

## 2018-04-18 DIAGNOSIS — Z96652 Presence of left artificial knee joint: Secondary | ICD-10-CM | POA: Diagnosis not present

## 2018-04-18 DIAGNOSIS — M1711 Unilateral primary osteoarthritis, right knee: Secondary | ICD-10-CM | POA: Diagnosis not present

## 2018-04-18 DIAGNOSIS — Z471 Aftercare following joint replacement surgery: Secondary | ICD-10-CM | POA: Diagnosis not present

## 2018-04-19 ENCOUNTER — Encounter: Payer: Self-pay | Admitting: Physician Assistant

## 2018-04-19 ENCOUNTER — Ambulatory Visit (INDEPENDENT_AMBULATORY_CARE_PROVIDER_SITE_OTHER): Payer: Medicare Other | Admitting: Physician Assistant

## 2018-04-19 VITALS — BP 140/70 | HR 68 | Temp 98.2°F | Resp 16 | Ht <= 58 in | Wt 118.4 lb

## 2018-04-19 DIAGNOSIS — M810 Age-related osteoporosis without current pathological fracture: Secondary | ICD-10-CM | POA: Diagnosis not present

## 2018-04-19 NOTE — Progress Notes (Signed)
Patient: Sherry Jones Female    DOB: 12/19/1940   77 y.o.   MRN: [id number] Visit Date: 04/19/2018  Today's Provider: Mar Daring, PA-C   Chief Complaint  Patient presents with  . Follow-up    Osteoporosis   Subjective:    HPI Osteoporosis: Patient complains of osteoporosis. She was diagnosed with osteoporosis by bone density scan. On 07/10 Bone density decreased slightly from -2.5 last year to -2.8 now. Patient here today to discuss options for treatment. She is currently taking Vit D and calcium supplements. She stays active daily with doing meals on wheels, visiting church shut ins and crocheting.       Allergies  Allergen Reactions  . Cefadroxil   . Cefuroxime   . Cefuroxime Axetil     Other reaction(s): Unknown Other reaction(s): NAUSEA  . Cephalosporins Other (See Comments)  . Ciprofloxacin Other (See Comments)  . Codeine Swelling  . Hydrocodone-Acetaminophen Other (See Comments)    Other reaction(s): Other (See Comments) Other reaction(s): Unknown Other reaction(s): Other (See Comments)  . Levofloxacin Other (See Comments)  . Meloxicam   . Oxycodone     Other reaction(s): Syncope  . Tramadol     Other reaction(s): Unknown  . Gabapentin Nausea And Vomiting  . Macrolides And Ketolides     Other reaction(s): Other (See Comments) Other Reaction: Intolerance Other reaction(s): Other (See Comments) Other Reaction: Intolerance Other reaction(s): Other (See Comments) Other Reaction: Intolerance Other reaction(s): Other (See Comments) Other Reaction: Intolerance  . Methocarbamol Rash  . Nitrofuran Derivatives Other (See Comments)    Other Reaction: Intolerance  . Nitrofurantoin     Other reaction(s): Other (See Comments) Other Reaction: Intolerance Other reaction(s): Other (See Comments) Other Reaction: Intolerance     Current Outpatient Medications:  .  calcium carbonate (OS-CAL) 600 MG TABS tablet, Take 1 tablet (600 mg total) by mouth 2  (two) times daily with a meal., Disp: 60 tablet, Rfl: 11 .  [facility]S CALCIUM 600 MG tablet, Take 1 tablet by mouth 2 (two) times daily with a meal., Disp: , Rfl: 11 .  levothyroxine (SYNTHROID, LEVOTHROID) 75 MCG tablet, TAKE 1 TABLET BY MOUTH EVERY DAY, Disp: 90 tablet, Rfl: 1 .  simvastatin (ZOCOR) 10 MG tablet, TAKE 1 TABLET BY MOUTH AT BEDTIME, Disp: 90 tablet, Rfl: 3 .  Vitamin D, Ergocalciferol, (DRISDOL) 50000 units CAPS capsule, TAKE 1 CAPSULE (50,000 UNITS TOTAL) BY MOUTH EVERY 30 (THIRTY) DAYS., Disp: 12 capsule, Rfl: 0 .  XARELTO 10 MG TABS tablet, TO BEGIN ON November 09, 2017 TAKING 1 TAB BY MOUTH DAILY AS DIRECTED, Disp: , Rfl: 3  Review of Systems  Constitutional: Negative.   Respiratory: Negative.   Cardiovascular: Negative.   Gastrointestinal: Negative.   Musculoskeletal: Positive for arthralgias (right wrist and bilat knees) and gait problem (congenital hip dysplasia).  Neurological: Negative for weakness and numbness.    Social History   Tobacco Use  . Smoking status: Former Smoker    Years: 36.00    Last attempt to quit: 01/02/1996    Years since quitting: 22.3  . Smokeless tobacco: Never Used  Substance Use Topics  . Alcohol use: No   Objective:   BP 140/70 (BP Location: Left Arm, Patient Position: Sitting, Cuff Size: Normal)   Pulse 68   Temp 98.2 F (36.8 C) (Oral)   Resp 16   Ht 4\' 6"  (1.372 m)   Wt 118 lb 6.4 oz (53.7 kg)   SpO2 97%  BMI 28.55 kg/m  Vitals:   04/19/18 1107  BP: 140/70  Pulse: 68  Resp: 16  Temp: 98.2 F (36.8 C)  TempSrc: Oral  SpO2: 97%  Weight: 118 lb 6.4 oz (53.7 kg)  Height: 4\' 6"  (1.372 m)     Physical Exam  Constitutional: She appears well-developed and well-nourished. No distress.  Neck: Normal range of motion. Neck supple.  Cardiovascular: Normal rate, regular rhythm and normal heart sounds. Exam reveals no gallop and no friction rub.  No murmur heard. Pulmonary/Chest: Effort normal and breath sounds normal. No  respiratory distress. She has no wheezes. She has no rales.  Skin: She is not diaphoretic.  Vitals reviewed.       Assessment & Plan:     1. Age-related osteoporosis without current pathological fracture Patient declines any other treatment options at this time. She states she feels well and wishes to continue her calcium + Vit D and staying active.        Mar Daring, PA-C  [facility]

## 2018-04-19 NOTE — Patient Instructions (Signed)
Osteoporosis Osteoporosis happens when your bones become thinner and weaker. Weak bones can break (fracture) more easily when you slip or fall. Bones most at risk of breaking are in the hip, wrist, and spine. Follow these instructions at home:  Get enough calcium and vitamin D. These nutrients are good for your bones.  Exercise as told by your doctor.  Do not use any tobacco products. This includes cigarettes, chewing tobacco, and electronic cigarettes. If you need help quitting, ask your doctor.  Limit the amount of alcohol you drink.  Take medicines only as told by your doctor.  Keep all follow-up visits as told by your doctor. This is important.  Take care at home to prevent falls. Some ways to do this are: ? Keep rooms well lit and tidy. ? Put safety rails on your stairs. ? Put a rubber mat in the bathroom and other places that are often wet or slippery. Get help right away if:  You fall.  You hurt yourself. This information is not intended to replace advice given to you by your health care provider. Make sure you discuss any questions you have with your health care provider. Document Released: 12/12/2011 Document Revised: 02/25/2016 Document Reviewed: 02/27/2014 Elsevier Interactive Patient Education  2018 [employer]  

## 2018-04-21 IMAGING — MG MM DIGITAL SCREENING BILAT W/ TOMO W/ CAD
8 of 12 series · 8 of 28 positions shown · non-contrast
Comparison: Previous exam(s).

CLINICAL DATA: Screening.

EXAM:
2D DIGITAL SCREENING BILATERAL MAMMOGRAM WITH CAD AND ADJUNCT TOMO

[R CC synth-2D]
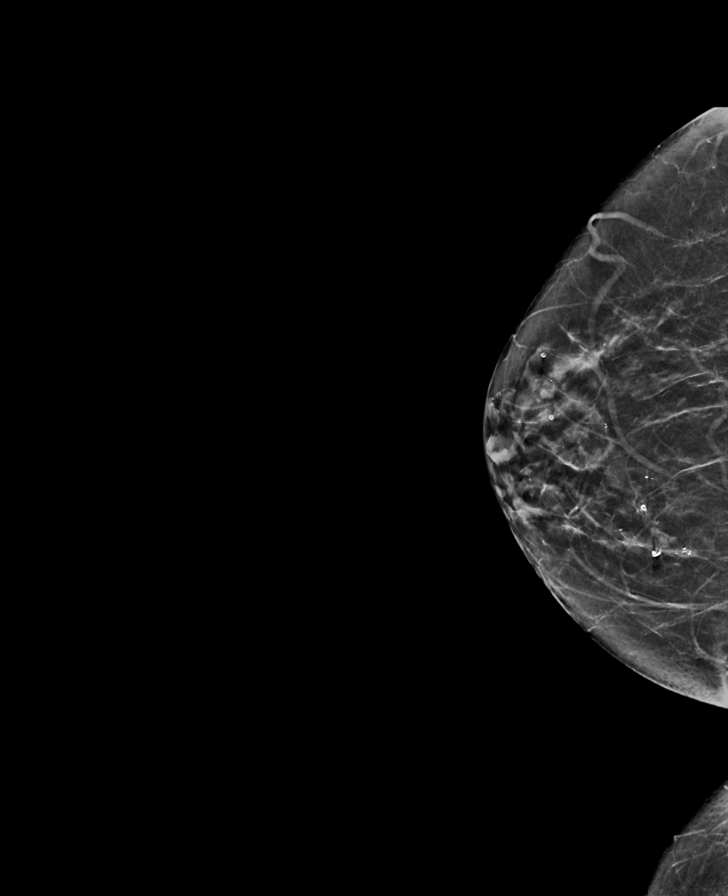

[R CC]
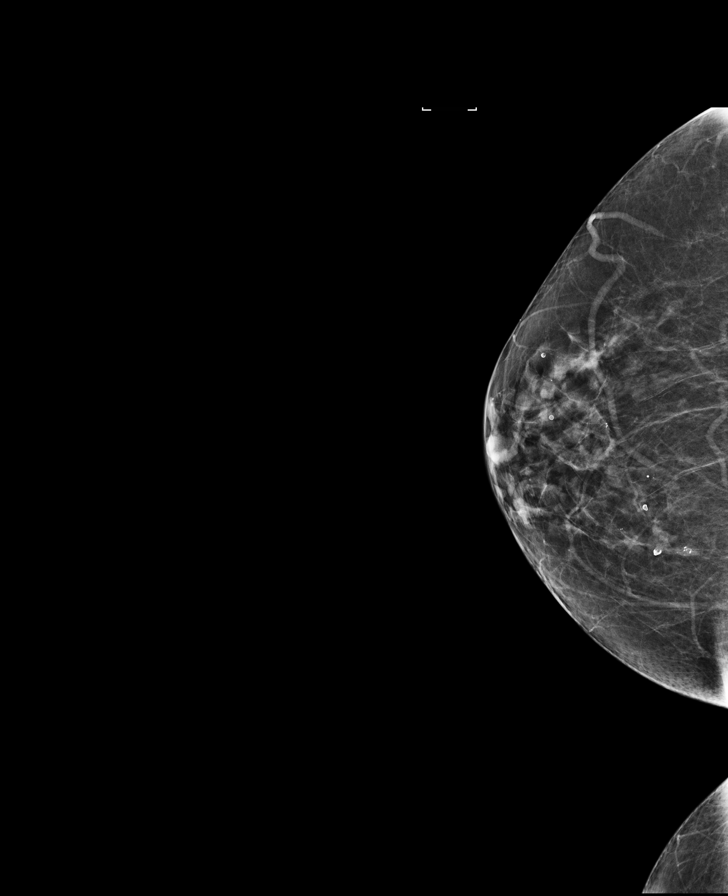

[L MLO]
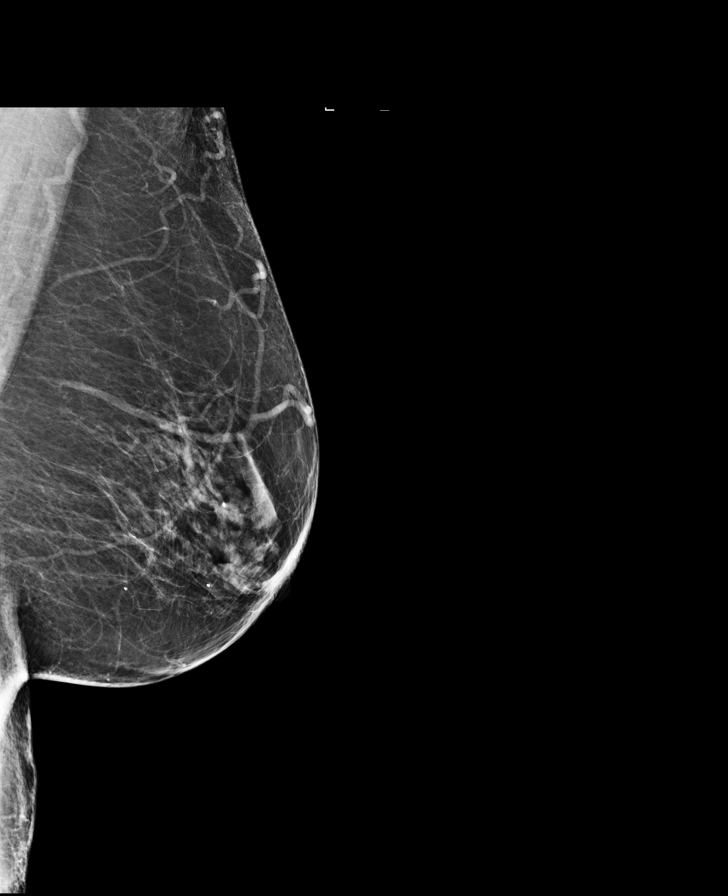

[R MLO synth-2D]
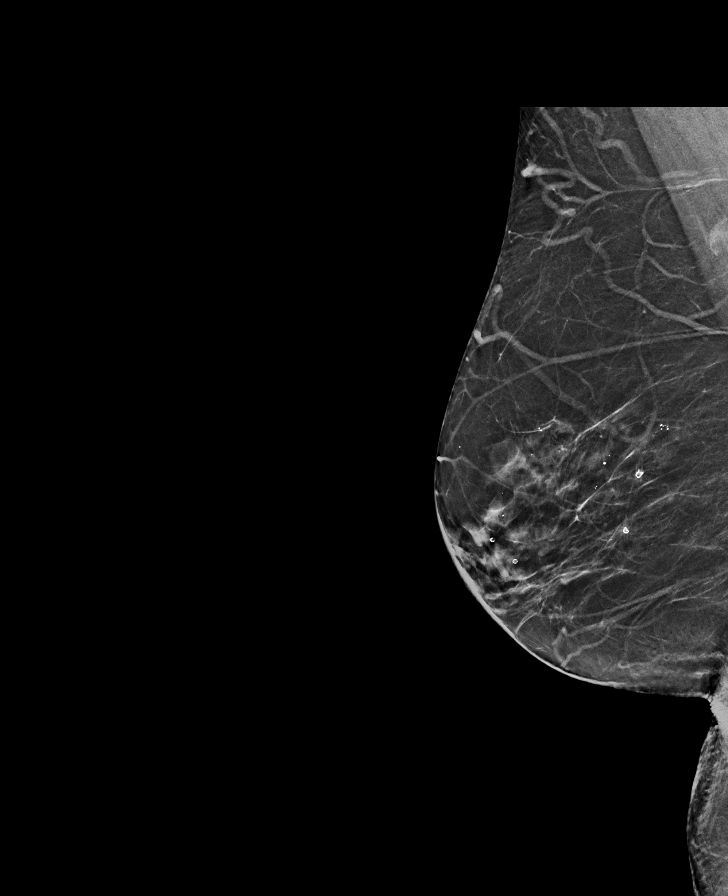

[L CC]
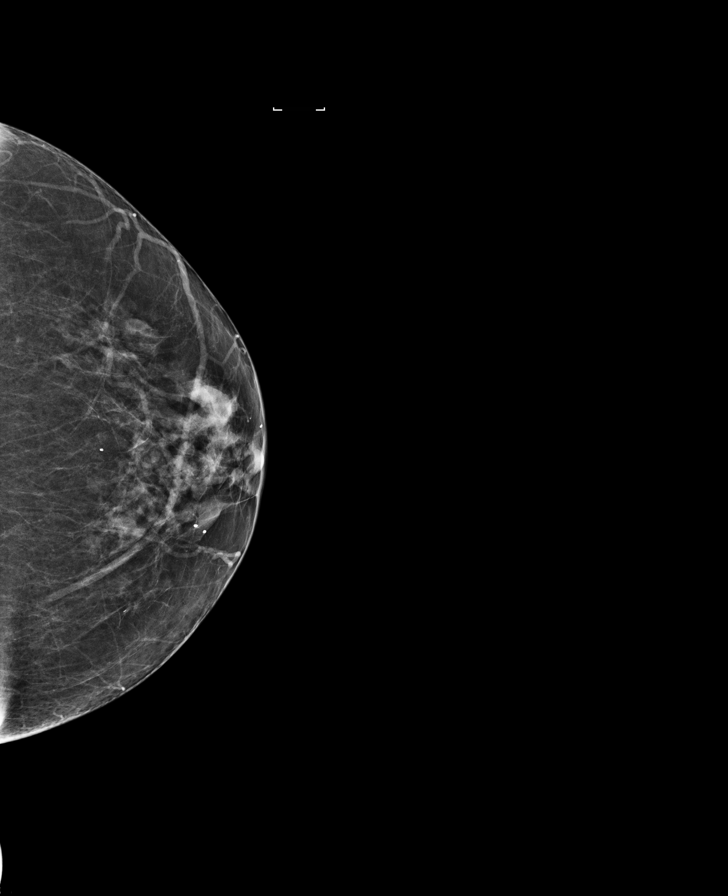

[L MLO synth-2D]
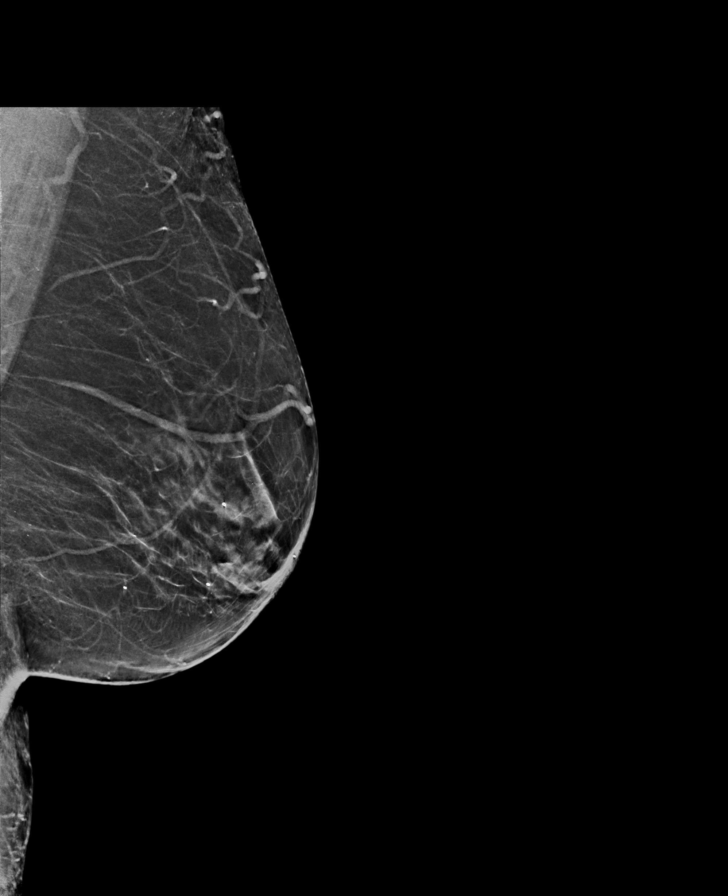

[L CC synth-2D]
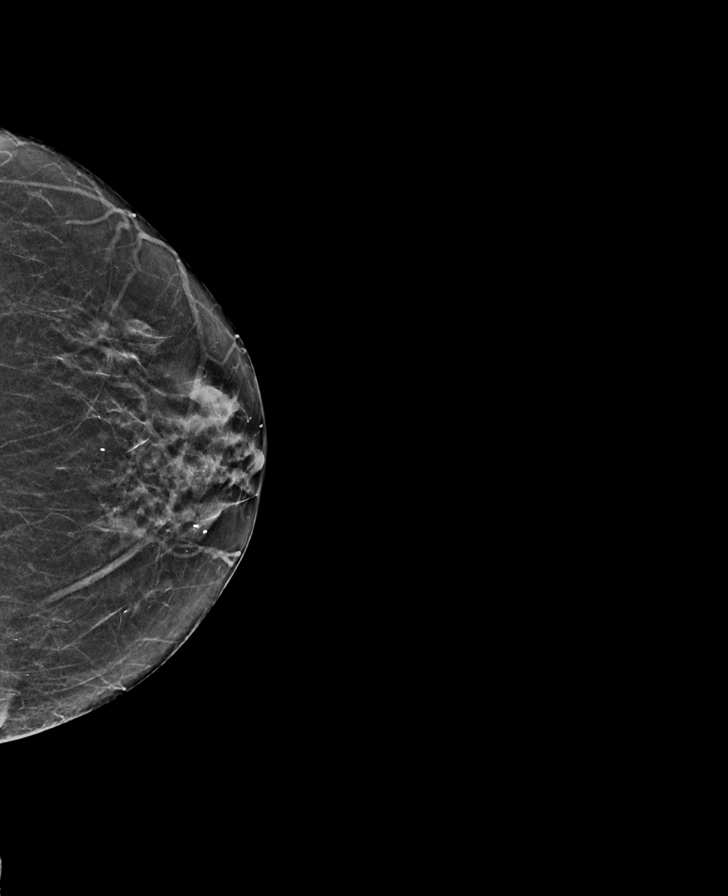

[R MLO]
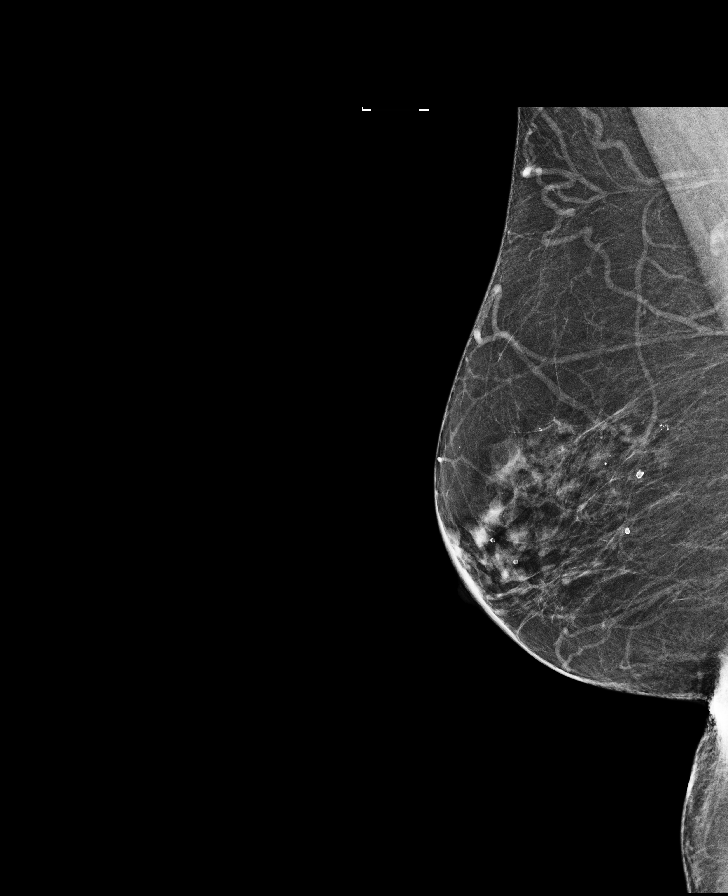

[8 of 28 positions shown; findings below may reference images not displayed]

ACR Breast Density Category c: The breast tissue is heterogeneously
dense, which may obscure small masses.
FINDINGS: There are no findings suspicious for malignancy. Images were
processed with CAD.
IMPRESSION: No mammographic evidence of malignancy. A result letter of this
screening mammogram will be mailed directly to the patient.

RECOMMENDATION:
Screening mammogram in one year. (Code:TN-[id number])

BI-RADS CATEGORY  1: Negative.

## 2018-07-29 ENCOUNTER — Other Ambulatory Visit: Payer: Self-pay

## 2018-07-29 ENCOUNTER — Emergency Department: Payer: Medicare Other

## 2018-07-29 ENCOUNTER — Emergency Department
Admission: EM | Admit: 2018-07-29 | Discharge: 2018-07-29 | Disposition: A | Discharge status: Home / Self Care | Payer: Medicare Other | Attending: Emergency Medicine | Admitting: Emergency Medicine

## 2018-07-29 DIAGNOSIS — Z8542 Personal history of malignant neoplasm of other parts of uterus: Secondary | ICD-10-CM | POA: Insufficient documentation

## 2018-07-29 DIAGNOSIS — Z87891 Personal history of nicotine dependence: Secondary | ICD-10-CM | POA: Insufficient documentation

## 2018-07-29 DIAGNOSIS — R079 Chest pain, unspecified: Secondary | ICD-10-CM | POA: Diagnosis not present

## 2018-07-29 DIAGNOSIS — E119 Type 2 diabetes mellitus without complications: Secondary | ICD-10-CM | POA: Diagnosis not present

## 2018-07-29 DIAGNOSIS — R0789 Other chest pain: Secondary | ICD-10-CM | POA: Diagnosis not present

## 2018-07-29 DIAGNOSIS — Z96652 Presence of left artificial knee joint: Secondary | ICD-10-CM | POA: Insufficient documentation

## 2018-07-29 DIAGNOSIS — Z79899 Other long term (current) drug therapy: Secondary | ICD-10-CM | POA: Insufficient documentation

## 2018-07-29 LAB — CBC
HCT: 36.7 % (ref 36.0–46.0)
HEMOGLOBIN: 12.3 g/dL (ref 12.0–15.0)
MCH: 29.8 pg (ref 26.0–34.0)
MCHC: 33.5 g/dL (ref 30.0–36.0)
MCV: 88.9 fL (ref 80.0–100.0)
PLATELETS: 183 10*3/uL (ref 150–400)
RBC: 4.13 MIL/uL (ref 3.87–5.11)
RDW: 14.7 % (ref 11.5–15.5)
WBC: 5.8 10*3/uL (ref 4.0–10.5)
nRBC: 0 % (ref 0.0–0.2)

## 2018-07-29 LAB — TROPONIN I

## 2018-07-29 LAB — BASIC METABOLIC PANEL
Anion gap: 9 (ref 5–15)
BUN: 18 mg/dL (ref 8–23)
CALCIUM: 8.6 mg/dL — AB (ref 8.9–10.3)
CHLORIDE: 103 mmol/L (ref 98–111)
CO2: 25 mmol/L (ref 22–32)
Creatinine, Ser: 0.76 mg/dL (ref 0.44–1.00)
GFR calc Af Amer: >60 mL/min (ref >60)
Glucose, Bld: 127 mg/dL — ABNORMAL HIGH (ref 70–99)
POTASSIUM: 4 mmol/L (ref 3.5–5.1)
Sodium: 137 mmol/L (ref 135–145)

## 2018-07-29 NOTE — Discharge Instructions (Signed)
Please call the number provided for cardiology to arrange a follow-up appointment as soon as possible for recheck/reevaluation and consideration of possible stress test.  If you have any further chest pain develop any further shortness of breath please return immediately to the emergency department.  Please inform your primary care doctor of your visit to the emergency department today.

## 2018-07-29 NOTE — ED Triage Notes (Signed)
Chest pain that radiates into back and down left arm with shortness of breath that started last night, gotten progressively worse, unable to sleep.

## 2018-07-29 NOTE — ED Provider Notes (Signed)
[facility] Emergency Department Provider Note  Time seen: 7:16 AM  I have reviewed the triage vital signs and the nursing notes.   HISTORY  Chief Complaint Chest Pain    HPI Sherry Jones is a 77 y.o. female with a past medical history of diabetes, hyperlipidemia, Barrett's esophagus, presents to the emergency department for chest discomfort.  According to the patient last night around midnight states she was trying to go to bed and she developed pain in the back of her neck seem to radiate into her shoulders and somewhat into her upper chest.  States she was short of breath and became somewhat sweaty at times.  Denies any nausea.  Denies any history of chest pain, or history of cardiac disease.  No heart attacks or stents.  States she has had stress tests in the past that have all been normal.  No leg pain or swelling.  Patient is on Xarelto.  Patient had a prolonged weight in the waiting room overnight has been in the emergency department nearly 5 hours at this time.  Patient has no complaints at this time states she feels better and wants to go home.   Past Medical History:  Diagnosis Date  . Cancer ([place])    uterine  . Diabetes mellitus without complication ([place])   . Hypercholesterolemia   . Thyroid disease     Patient Active Problem List   Diagnosis Date Noted  . Age-related osteoporosis without current pathological fracture 04/19/2018  . 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 H/O non-Hodgkin's lymphoma 01/23/2017  . History of deep vein thrombosis (DVT) of lower extremity 01/23/2017  . Back pain 08/04/2016  . Diabetes ([place]) 08/04/2016  . Protein-calorie malnutrition, severe 08/04/2016  . Gonalgia 08/25/2015  . Contact dermatitis 08/11/2015  . Atypical nevus 05/29/2015  . Shoulder pain, left 05/29/2015  . Hypercholesteremia 05/26/2015  . 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 H/O ear disorder 06/04/2014  . History of uterine cancer 06/04/2014  . Avitaminosis D 11/20/2009  . Acquired hypothyroidism 11/26/2007  . Barrett esophagus 11/23/2007  . Narrowing of intervertebral disc space 11/23/2007  . Allergic rhinitis 11/22/2007  . Arthritis, degenerative 11/22/2007  . Disease of airway 11/22/2007    Past Surgical History:  Procedure Laterality Date  . ABDOMINAL HYSTERECTOMY    . CATARACT EXTRACTION    . REPLACEMENT TOTAL KNEE     left knee  . ROTATOR CUFF REPAIR Left     Prior to Admission medications   Medication Sig Start Date End Date Taking? Authorizing Provider  calcium carbonate (OS-CAL) 600 MG TABS tablet Take 1 tablet (600 mg total) by mouth 2 (two) times daily with a meal. 03/16/17   Burnette, Clearnce Sorrel, PA-C  [facility] CALCIUM 600 MG tablet Take 1 tablet by mouth 2 (two) times daily with a meal. 01/15/18   [provider]  levothyroxine (SYNTHROID, LEVOTHROID) 75 MCG tablet TAKE 1 TABLET BY MOUTH EVERY DAY 03/20/18   Mar Daring, PA-C  simvastatin (ZOCOR) 10 MG tablet TAKE 1 TABLET BY MOUTH AT BEDTIME 02/27/18   Mar Daring, PA-C  Vitamin D, Ergocalciferol, (DRISDOL) 50000 units CAPS capsule TAKE 1 CAPSULE (50,000 UNITS TOTAL) BY MOUTH EVERY 30 (THIRTY) DAYS. 11/02/17   Burnette, Clearnce Sorrel, PA-C  XARELTO 10 MG TABS tablet TO BEGIN ON November 09, 2017 TAKING 1 TAB BY MOUTH DAILY AS DIRECTED 11/09/17   [provider]  Allergies  Allergen Reactions  . Cefadroxil   . Cefuroxime   . Cefuroxime Axetil     Other reaction(s): Unknown Other reaction(s): NAUSEA  . Cephalosporins Other (See Comments)  . Ciprofloxacin Other (See Comments)  . Codeine Swelling  . Hydrocodone-Acetaminophen Other (See Comments)    Other reaction(s): Other (See Comments) Other reaction(s): Unknown Other reaction(s): Other (See Comments)  . Levofloxacin Other (See Comments)  . Meloxicam   . Oxycodone     Other reaction(s):  Syncope  . Tramadol     Other reaction(s): Unknown  . Gabapentin Nausea And Vomiting  . Macrolides And Ketolides     Other reaction(s): Other (See Comments) Other Reaction: Intolerance Other reaction(s): Other (See Comments) Other Reaction: Intolerance Other reaction(s): Other (See Comments) Other Reaction: Intolerance Other reaction(s): Other (See Comments) Other Reaction: Intolerance  . Methocarbamol Rash  . Nitrofuran Derivatives Other (See Comments)    Other Reaction: Intolerance  . Nitrofurantoin     Other reaction(s): Other (See Comments) Other Reaction: Intolerance Other reaction(s): Other (See Comments) Other Reaction: Intolerance    Family History  Problem Relation Age of Onset  . Alzheimer's disease Mother   . Diabetes Brother   . Osteoarthritis Brother   . Hypertension Son   . Heart attack Son        stents placed  . Breast cancer Neg Hx     Social History Social History   Tobacco Use  . Smoking status: Former Smoker    Years: 36.00    Last attempt to quit: 01/02/1996    Years since quitting: 22.5  . Smokeless tobacco: Never Used  Substance Use Topics  . Alcohol use: No  . Drug use: No    Review of Systems Constitutional: Negative for fever. Cardiovascular: Positive for chest discomfort, resolved Respiratory: Mild shortness of breath, resolved Gastrointestinal: Negative for abdominal pain, vomiting  Genitourinary: Negative for urinary compaints Musculoskeletal: Negative for leg pain or swelling. Skin: Negative for skin complaints  Neurological: Negative for headache All other ROS negative  ____________________________________________   PHYSICAL EXAM:  VITAL SIGNS: ED Triage Vitals  Enc Vitals Group     BP 07/29/18 0247 106/87     Pulse Rate 07/29/18 0247 66     Resp 07/29/18 0247 20     Temp 07/29/18 0247 97.6 F (36.4 C)     Temp Source 07/29/18 0247 Oral     SpO2 07/29/18 0247 100 %     Weight 07/29/18 0248 120 lb (54.4 kg)      Height 07/29/18 0248 4\' 5"  (1.346 m)     Head Circumference --      Peak Flow --      Pain Score 07/29/18 0247 10     Pain Loc --      Pain Edu? --      Excl. in [place]? --    Constitutional: Alert and oriented. Well appearing and in no distress. Eyes: Normal exam ENT   Head: Normocephalic and atraumatic.   Mouth/Throat: Mucous membranes are moist. Cardiovascular: Normal rate, regular rhythm. No murmur Respiratory: Normal respiratory effort without tachypnea nor retractions. Breath sounds are clear  Gastrointestinal: Soft and nontender. No distention.   Musculoskeletal: Nontender with normal range of motion in all extremities.  Neurologic:  Normal speech and language. No gross focal neurologic deficits  Skin:  Skin is warm, dry and intact.  Psychiatric: Mood and affect are normal.   ____________________________________________    EKG  EKG reviewed and interpreted by myself shows  a normal sinus rhythm at 61 bpm with a narrow QRS, normal axis, normal intervals, no ST changes.  Normal EKG.  ____________________________________________    RADIOLOGY  Chest x-ray negative  ____________________________________________   INITIAL IMPRESSION / ASSESSMENT AND PLAN / ED COURSE  Pertinent labs & imaging results that were available during my care of the patient were reviewed by me and considered in my medical decision making (see chart for details).  Patient presents to the emergency department with somewhat concerning chest discomfort started in the back of the neck rating to the shoulders and somewhat into the upper chest per patient.  Started around midnight has since completely resolved.  Denies any chest pain denies any trouble breathing, denies any nausea at any point, denies any current diaphoresis.  Patient denies any history of heart disease.  Does have a history of Barrett's esophagus.  Patient's labs are reassuring including a negative troponin.  Chest x-ray is negative.   EKG is normal.  Overall the patient appears very well, no distress.  Patient had been in the emergency department for a prolonged wait given a very busy overnight shift.  Upon seeing the patient she was asking to sign out and leave the emergency department.  However after speaking to the patient she was agreeable to stay for a repeat troponin.  If the patient's repeat troponin is negative I believe the patient would be safe for discharge home per her wishes with cardiology follow-up.  Patient is agreeable to this plan of care.  Repeat troponin is negative.  We will discharge patient home with cardiology follow-up.  ____________________________________________   FINAL CLINICAL IMPRESSION(S) / ED DIAGNOSES  Chest pain    Harvest Dark, MD 07/29/18 1500

## 2018-09-11 ENCOUNTER — Other Ambulatory Visit: Payer: Self-pay | Admitting: Physician Assistant

## 2018-09-11 DIAGNOSIS — E039 Hypothyroidism, unspecified: Secondary | ICD-10-CM

## 2019-01-21 ENCOUNTER — Telehealth: Payer: Self-pay | Admitting: Physician Assistant

## 2019-01-24 ENCOUNTER — Ambulatory Visit: Payer: Self-pay

## 2019-01-28 ENCOUNTER — Other Ambulatory Visit: Payer: Self-pay

## 2019-01-28 ENCOUNTER — Ambulatory Visit (INDEPENDENT_AMBULATORY_CARE_PROVIDER_SITE_OTHER): Payer: Medicare Other

## 2019-01-28 DIAGNOSIS — Z0000 Encounter for general adult medical examination without abnormal findings: Secondary | ICD-10-CM | POA: Diagnosis not present

## 2019-01-28 NOTE — Patient Instructions (Addendum)
Sherry Jones , Thank you for taking time to come for your Medicare Wellness Visit. I appreciate your ongoing commitment to your health goals. Please review the following plan we discussed and let me know if I can assist you in the future.   Screening recommendations/referrals: Colonoscopy: No longer required.  Mammogram: No longer required.  Bone Density: Up to date, due 04/2020 Recommended yearly ophthalmology/optometry visit for glaucoma screening and checkup Recommended yearly dental visit for hygiene and checkup  Vaccinations: Influenza vaccine: Not required at this time.  Pneumococcal vaccine: Completed series Tdap vaccine: Up to date, due 11/2025 Shingles vaccine: Pt declines today.     Advanced directives: Advance directive discussed with you today. Even though you declined this today please call our office should you change your mind and we can give you the proper paperwork for you to fill out.  Conditions/risks identified: Continue increasing water intake to 6-8 8 oz glasses a day.   Next appointment: 04/17/19 @ 2:00 PM with Fenton Malling.    Preventive Care 77 Years and Older, Female Preventive care refers to lifestyle choices and visits with your health care provider that can promote health and wellness. What does preventive care include?  A yearly physical exam. This is also called an annual well check.  Dental exams once or twice a year.  Routine eye exams. Ask your health care provider how often you should have your eyes checked.  Personal lifestyle choices, including:  Daily care of your teeth and gums.  Regular physical activity.  Eating a healthy diet.  Avoiding tobacco and drug use.  Limiting alcohol use.  Practicing safe sex.  Taking low-dose aspirin every day.  Taking vitamin and mineral supplements as recommended by your health care provider. What happens during an annual well check? The services and screenings done by your health care provider  during your annual well check will depend on your age, overall health, lifestyle risk factors, and family history of disease. Counseling  Your health care provider may ask you questions about your:  Alcohol use.  Tobacco use.  Drug use.  Emotional well-being.  Home and relationship well-being.  Sexual activity.  Eating habits.  History of falls.  Memory and ability to understand (cognition).  Work and work Statistician.  Reproductive health. Screening  You may have the following tests or measurements:  Height, weight, and BMI.  Blood pressure.  Lipid and cholesterol levels. These may be checked every 5 years, or more frequently if you are over 33 years old.  Skin check.  Lung cancer screening. You may have this screening every year starting at age 55 if you have a 30-pack-year history of smoking and currently smoke or have quit within the past 15 years.  Fecal occult blood test (FOBT) of the stool. You may have this test every year starting at age 46.  Flexible sigmoidoscopy or colonoscopy. You may have a sigmoidoscopy every 5 years or a colonoscopy every 10 years starting at age 59.  Hepatitis C blood test.  Hepatitis B blood test.  Sexually transmitted disease (STD) testing.  Diabetes screening. This is done by checking your blood sugar (glucose) after you have not eaten for a while (fasting). You may have this done every 1-3 years.  Bone density scan. This is done to screen for osteoporosis. You may have this done starting at age 42.  Mammogram. This may be done every 1-2 years. Talk to your health care provider about how often you should have regular mammograms. Talk  with your health care provider about your test results, treatment options, and if necessary, the need for more tests. Vaccines  Your health care provider may recommend certain vaccines, such as:  Influenza vaccine. This is recommended every year.  Tetanus, diphtheria, and acellular pertussis  (Tdap, Td) vaccine. You may need a Td booster every 10 years.  Zoster vaccine. You may need this after age 20.  Pneumococcal 13-valent conjugate (PCV13) vaccine. One dose is recommended after age 5.  Pneumococcal polysaccharide (PPSV23) vaccine. One dose is recommended after age 22. Talk to your health care provider about which screenings and vaccines you need and how often you need them. This information is not intended to replace advice given to you by your health care provider. Make sure you discuss any questions you have with your health care provider. Document Released: 10/16/2015 Document Revised: 06/08/2016 Document Reviewed: 07/21/2015 Elsevier Interactive Patient Education  2017 [place] Prevention in the Home Falls can cause injuries. They can happen to people of all ages. There are many things you can do to make your home safe and to help prevent falls. What can I do on the outside of my home?  Regularly fix the edges of walkways and driveways and fix any cracks.  Remove anything that might make you trip as you walk through a door, such as a raised step or threshold.  Trim any bushes or trees on the path to your home.  Use bright outdoor lighting.  Clear any walking paths of anything that might make someone trip, such as rocks or tools.  Regularly check to see if handrails are loose or broken. Make sure that both sides of any steps have handrails.  Any raised decks and porches should have guardrails on the edges.  Have any leaves, snow, or ice cleared regularly.  Use sand or salt on walking paths during winter.  Clean up any spills in your garage right away. This includes oil or grease spills. What can I do in the bathroom?  Use night lights.  Install grab bars by the toilet and in the tub and shower. Do not use towel bars as grab bars.  Use non-skid mats or decals in the tub or shower.  If you need to sit down in the shower, use a plastic, non-slip  stool.  Keep the floor dry. Clean up any water that spills on the floor as soon as it happens.  Remove soap buildup in the tub or shower regularly.  Attach bath mats securely with double-sided non-slip rug tape.  Do not have throw rugs and other things on the floor that can make you trip. What can I do in the bedroom?  Use night lights.  Make sure that you have a light by your bed that is easy to reach.  Do not use any sheets or blankets that are too big for your bed. They should not hang down onto the floor.  Have a firm chair that has side arms. You can use this for support while you get dressed.  Do not have throw rugs and other things on the floor that can make you trip. What can I do in the kitchen?  Clean up any spills right away.  Avoid walking on wet floors.  Keep items that you use a lot in easy-to-reach places.  If you need to reach something above you, use a strong step stool that has a grab bar.  Keep electrical cords out of the way.  Do  not use floor polish or wax that makes floors slippery. If you must use wax, use non-skid floor wax.  Do not have throw rugs and other things on the floor that can make you trip. What can I do with my stairs?  Do not leave any items on the stairs.  Make sure that there are handrails on both sides of the stairs and use them. Fix handrails that are broken or loose. Make sure that handrails are as long as the stairways.  Check any carpeting to make sure that it is firmly attached to the stairs. Fix any carpet that is loose or worn.  Avoid having throw rugs at the top or bottom of the stairs. If you do have throw rugs, attach them to the floor with carpet tape.  Make sure that you have a light switch at the top of the stairs and the bottom of the stairs. If you do not have them, ask someone to add them for you. What else can I do to help prevent falls?  Wear shoes that:  Do not have high heels.  Have rubber bottoms.  Are  comfortable and fit you well.  Are closed at the toe. Do not wear sandals.  If you use a stepladder:  Make sure that it is fully opened. Do not climb a closed stepladder.  Make sure that both sides of the stepladder are locked into place.  Ask someone to hold it for you, if possible.  Clearly mark and make sure that you can see:  Any grab bars or handrails.  First and last steps.  Where the edge of each step is.  Use tools that help you move around (mobility aids) if they are needed. These include:  Canes.  Walkers.  Scooters.  Crutches.  Turn on the lights when you go into a dark area. Replace any light bulbs as soon as they burn out.  Set up your furniture so you have a clear path. Avoid moving your furniture around.  If any of your floors are uneven, fix them.  If there are any pets around you, be aware of where they are.  Review your medicines with your doctor. Some medicines can make you feel dizzy. This can increase your chance of falling. Ask your doctor what other things that you can do to help prevent falls. This information is not intended to replace advice given to you by your health care provider. Make sure you discuss any questions you have with your health care provider. Document Released: 07/16/2009 Document Revised: 02/25/2016 Document Reviewed: 10/24/2014 Elsevier Interactive Patient Education  2017 [employer].

## 2019-01-28 NOTE — Progress Notes (Signed)
Subjective:   Sherry Jones is a 78 y.o. female who presents for Medicare Annual (Subsequent) preventive examination.    This visit is being conducted through telemedicine due to the COVID-19 pandemic. This patient has given me verbal consent via doximity to conduct this visit, patient states they are participating from their home address. Some vital signs may be absent or patient reported.    Patient identification: identified by name, DOB, and current address  Review of Systems:  N/A  Cardiac Risk Factors include: advanced age (>64men, >48 women);diabetes mellitus;dyslipidemia     Objective:     Vitals: There were no vitals taken for this visit.  There is no height or weight on file to calculate BMI. Unable to obtain vitals due to visit being conducted via virtually.    Advanced Directives 01/28/2019 07/29/2018 01/22/2018 01/20/2017 08/04/2016 08/03/2016 01/20/2016  Does Patient Have a Medical Advance Directive? No No No No - Yes No  Type of Advance Directive - - - - Catering manager -  Does patient want to make changes to medical advance directive? - - - - No - Patient declined No - Patient declined -  Copy of [place] in Chart? - - - - No - copy requested Yes -  Would patient like information on creating a medical advance directive? No - Patient declined - No - Patient declined No - Patient declined - - -    Tobacco Social History   Tobacco Use  Smoking Status Former Smoker  . Years: 36.00  . Last attempt to quit: 01/02/1996  . Years since quitting: 23.0  Smokeless Tobacco Never Used     Counseling given: Not Answered   Clinical Intake:  Pre-visit preparation completed: Yes  Pain : No/denies pain Pain Score: 0-No pain     Nutritional Status: BMI > 30  Obese Nutritional Risks: None Diabetes: Yes  How often do you need to have someone help you when you read instructions, pamphlets, or other written  materials from your doctor or pharmacy?: 1 - Never   Diabetes:  Is the patient diabetic?  Yes type 2 If diabetic, was a CBG obtained today?  No  Did the patient bring in their glucometer from home?  No  How often do you monitor your CBG's? Once a day.   Financial Strains and Diabetes Management:  Are you having any financial strains with the device, your supplies or your medication? No .  Does the patient want to be seen by Chronic Care Management for management of their diabetes?  No  Would the patient like to be referred to a Nutritionist or for Diabetic Management?  No   Diabetic Exams:  Diabetic Eye Exam: Completed 06/03/16. Overdue for diabetic eye exam. Pt has been advised about the importance in completing this exam.   Diabetic Foot Exam: Completed 05/21/14. Pt has been advised about the importance in completing this exam. Note made to f/u on this at next OV.    Interpreter Needed?: No  Information entered by :: MMarkoski, LPN  Past Medical History:  Diagnosis Date  . Cancer ([place])    uterine  . Diabetes mellitus without complication ([place])   . Hypercholesterolemia   . Thyroid disease    Past Surgical History:  Procedure Laterality Date  . ABDOMINAL HYSTERECTOMY    . CATARACT EXTRACTION    . REPLACEMENT TOTAL KNEE     left knee  . ROTATOR CUFF REPAIR Left  Family History  Problem Relation Age of Onset  . Alzheimer's disease Mother   . Diabetes Brother   . Osteoarthritis Brother   . Hypertension Son   . Heart attack Son        stents placed  . Breast cancer Neg Hx    Social History   Socioeconomic History  . Marital status: Widowed    Spouse name: Not on file  . Number of children: 3  . Years of education: Not on file  . Highest education level: Some college, no degree  Occupational History  . Occupation: retired  Scientific laboratory technician  . Financial resource strain: Not hard at all  . Food insecurity:    Worry: Never true    Inability: Never true  .  Transportation needs:    Medical: No    Non-medical: No  Tobacco Use  . Smoking status: Former Smoker    Years: 36.00    Last attempt to quit: 01/02/1996    Years since quitting: 23.0  . Smokeless tobacco: Never Used  Substance and Sexual Activity  . Alcohol use: No  . Drug use: No  . Sexual activity: Never  Lifestyle  . Physical activity:    Days per week: 0 days    Minutes per session: 0 min  . Stress: Not at all  Relationships  . Social connections:    Talks on phone: Patient refused    Gets together: Patient refused    Attends religious service: Patient refused    Active member of club or organization: Patient refused    Attends meetings of clubs or organizations: Patient refused    Relationship status: Patient refused  Other Topics Concern  . Not on file  Social History Narrative   Oldest son was killed in 2004 from getting hit by a train.     Outpatient Encounter Medications as of 01/28/2019  Medication Sig  . [facility] CALCIUM 600 MG tablet Take 1 tablet by mouth 2 (two) times daily with a meal.  . levothyroxine (SYNTHROID, LEVOTHROID) 75 MCG tablet TAKE 1 TABLET BY MOUTH EVERY DAY  . simvastatin (ZOCOR) 10 MG tablet TAKE 1 TABLET BY MOUTH AT BEDTIME  . Vitamin D, Ergocalciferol, (DRISDOL) 50000 units CAPS capsule TAKE 1 CAPSULE (50,000 UNITS TOTAL) BY MOUTH EVERY 30 (THIRTY) DAYS.  Marland Kitchen calcium carbonate (OS-CAL) 600 MG TABS tablet Take 1 tablet (600 mg total) by mouth 2 (two) times daily with a meal. (Patient not taking: Reported on 01/28/2019)  . XARELTO 10 MG TABS tablet TO BEGIN ON November 09, 2017 TAKING 1 TAB BY MOUTH DAILY AS DIRECTED   No facility-administered encounter medications on file as of 01/28/2019.     Activities of Daily Living In your present state of health, do you have any difficulty performing the following activities: 01/28/2019  Hearing? N  Vision? N  Difficulty concentrating or making decisions? N  Walking or climbing stairs? Y  Comment Due to back  pains.   Dressing or bathing? N  Doing errands, shopping? N  Preparing Food and eating ? N  Using the Toilet? N  In the past six months, have you accidently leaked urine? N  Do you have problems with loss of bowel control? N  Managing your Medications? N  Managing your Finances? N  Housekeeping or managing your Housekeeping? N  Some recent data might be hidden    Patient Care Team: [name] as PCP - General (Family Medicine) Delgaizo, Lavon Paganini, MD as Referring Physician (  Orthopedic Surgery) Key, Kelli Churn, MD as Referring Physician (Oncology)    Assessment:   This is a routine wellness examination for Sherry Jones.  Exercise Activities and Dietary recommendations Current Exercise Habits: The patient does not participate in regular exercise at present, Exercise limited by: orthopedic condition(s)  Goals    . Increase water intake     Continue drinking 6-8 glasses of water a day.    . Prevent falls     Discussed ways to avoid falls inside and outside the house.        Fall Risk: Fall Risk  01/28/2019 01/22/2018 01/20/2017 01/20/2016  Falls in the past year? 0 Yes Yes Yes  Number falls in past yr: - 2 or more 1 1  Injury with Fall? - No Yes Yes  Comment - - will have knee surgery in the future -  Risk for fall due to : - Other (Comment) - -  Risk for fall due to: Comment - prior to total knee replacement in 05/2018 - -  Follow up - Falls prevention discussed Falls prevention discussed -    FALL RISK PREVENTION PERTAINING TO THE HOME:  Any stairs in or around the home? Yes  If so, are there any without handrails? Yes   Home free of loose throw rugs in walkways, pet beds, electrical cords, etc? Yes  Adequate lighting in your home to reduce risk of falls? Yes   ASSISTIVE DEVICES UTILIZED TO PREVENT FALLS:  Life alert? No Use of a cane, walker or w/c? Yes  Grab bars in the bathroom? Yes  Shower chair or bench in shower? Yes  Elevated toilet seat or a  handicapped toilet? No   TIMED UP AND GO:  Was the test performed? No .    Depression Screen PHQ 2/9 Scores 01/28/2019 01/22/2018 01/20/2017 01/20/2016  PHQ - 2 Score 0 0 0 0  PHQ- 9 Score 0 - - -     Cognitive Function     6CIT Screen 01/28/2019 01/20/2017  What Year? 0 points 0 points  What month? 0 points 0 points  What time? 0 points 0 points  Count back from 20 0 points 0 points  Months in reverse 0 points 0 points  Repeat phrase 0 points 8 points  Total Score 0 8    Immunization History  Administered Date(s) Administered  . Influenza, High Dose Seasonal PF 09/03/2014, 08/09/2016  . Influenza,inj,Quad PF,6+ Mos 06/24/2013  . Pneumococcal Conjugate-13 05/14/2014  . Pneumococcal Polysaccharide-23 05/27/2011  . Td 10/03/1997  . Tdap 05/27/2011, 11/21/2015  . Zoster 02/24/2012    Qualifies for Shingles Vaccine? Yes  Zostavax completed 02/24/12. Due for Shingrix. Education has been provided regarding the importance of this vaccine. Pt has been advised to call insurance company to determine out of pocket expense. Advised may also receive vaccine at local pharmacy or Health Dept. Verbalized acceptance and understanding.  Tdap: Up to date  Flu Vaccine: N/A  Pneumococcal Vaccine: Completed series  Screening Tests Health Maintenance  Topic Date Due  . FOOT EXAM  10/22/1950  . URINE MICROALBUMIN  10/22/1950  . OPHTHALMOLOGY EXAM  06/03/2017  . HEMOGLOBIN A1C  07/22/2017  . INFLUENZA VACCINE  05/04/2019  . DEXA SCAN  04/11/2020  . TETANUS/TDAP  11/20/2025  . PNA vac Low Risk Adult  Completed    Cancer Screenings:  Colorectal Screening: No longer required.   Mammogram: No longer required.   Bone Density: Completed 04/11/18. Results reflect OSTEOPOROSIS. Repeat every 2 years.  Lung Cancer Screening: (Low Dose CT Chest recommended if Age 64-80 years, 30 pack-year currently smoking OR have quit w/in 15years.) does not qualify.   Additional Screening:   Dental  Screening: Recommended annual dental exams for proper oral hygiene   Community Resource Referral:  CRR required this visit?  No       Plan:  I have personally reviewed and addressed the Medicare Annual Wellness questionnaire and have noted the following in the patient's chart:  A. Medical and social history B. Use of alcohol, tobacco or illicit drugs  C. Current medications and supplements D. Functional ability and status E.  Nutritional status F.  Physical activity G. Advance directives H. List of other physicians I.  Hospitalizations, surgeries, and ER visits in previous 12 months J.  [place] such as hearing and vision if needed, cognitive and depression L. Referrals and appointments   In addition, I have reviewed and discussed with patient certain preventive protocols, quality metrics, and best practice recommendations. A written personalized care plan for preventive services as well as general preventive health recommendations were provided to patient.   Glendora Score, [place]  3/76/2831 Nurse Health Advisor   Nurse Notes: Pt needs a diabetic foot exam, Hgb a1c and urine check at next OV. Pt plans to set up an eye exam this year.

## 2019-02-23 ENCOUNTER — Other Ambulatory Visit: Payer: Self-pay | Admitting: Physician Assistant

## 2019-02-23 DIAGNOSIS — E7800 Pure hypercholesterolemia, unspecified: Secondary | ICD-10-CM

## 2019-02-26 NOTE — Telephone Encounter (Signed)
Please review

## 2019-02-27 ENCOUNTER — Other Ambulatory Visit: Payer: Self-pay | Admitting: Physician Assistant

## 2019-02-27 DIAGNOSIS — E559 Vitamin D deficiency, unspecified: Secondary | ICD-10-CM

## 2019-03-15 ENCOUNTER — Other Ambulatory Visit: Payer: Self-pay | Admitting: Physician Assistant

## 2019-03-15 DIAGNOSIS — E039 Hypothyroidism, unspecified: Secondary | ICD-10-CM

## 2019-03-19 ENCOUNTER — Other Ambulatory Visit: Payer: Self-pay | Admitting: Physician Assistant

## 2019-03-20 ENCOUNTER — Other Ambulatory Visit: Payer: Self-pay | Admitting: Physician Assistant

## 2019-03-20 DIAGNOSIS — Z1231 Encounter for screening mammogram for malignant neoplasm of breast: Secondary | ICD-10-CM

## 2019-04-15 ENCOUNTER — Telehealth: Payer: Self-pay | Admitting: Physician Assistant

## 2019-04-15 NOTE — Chronic Care Management (AMB) (Signed)
Chronic Care Management   Note  04/15/2019 Name: Sherry Jones MRN: [id number] DOB: Feb 03, 1941  Sherry Jones is a 78 y.o. year old female who is a primary care patient of [name]. I reached out to Sherry Jones by phone today in response to a referral sent by Ms. Etana L Sirianni's health plan.    Ms. Borbon was given information about Chronic Care Management services today including:  1. CCM service includes personalized support from designated clinical staff supervised by her physician, including individualized plan of care and coordination with other care providers 2. 24/7 contact phone numbers for assistance for urgent and routine care needs. 3. Service will only be billed when office clinical staff spend 20 minutes or more in a month to coordinate care. 4. Only one practitioner may furnish and bill the service in a calendar month. 5. The patient may stop CCM services at any time (effective at the end of the month) by phone call to the office staff. 6. The patient will be responsible for cost sharing (co-pay) of up to 20% of the service fee (after annual deductible is met).  Patient did not agree to enrollment in care management services and does not wish to consider at this time.  Follow up plan: The patient has been provided with contact information for the chronic care management team and has been advised to call with any health related questions or concerns.   [place]  [email or web address]   ??[phone]

## 2019-04-16 NOTE — Progress Notes (Signed)
Patient: Sherry Jones, Female    DOB: [birth year], 78 y.o.   MRN: [id number] Visit Date: 04/17/2019  Today's Provider: Mar Daring, PA-C   Chief Complaint  Patient presents with  . Annual Exam   Subjective:    I,Sherry Jones,RMA am acting as a Education administrator for [employer], PA-C.   Patient has AWV with [facility] on 01/25/2019   Complete Physical Sherry Jones is a 78 y.o. female. She feels well. She reports exercising none. She reports she is sleeping well. -----------------------------------------------------------   Review of Systems  Constitutional: Negative.   HENT: Negative.   Eyes: Negative.   Respiratory: Negative.   Cardiovascular: Negative.   Gastrointestinal: Negative.   Endocrine: Negative.   Genitourinary: Negative.   Musculoskeletal: Negative.   Skin: Negative.   Allergic/Immunologic: Negative.   Neurological: Negative.   Hematological: Negative.   Psychiatric/Behavioral: Negative.     Social History   Socioeconomic History  . Marital status: Widowed    Spouse name: Not on file  . Number of children: 3  . Years of education: Not on file  . Highest education level: Some college, no degree  Occupational History  . Occupation: retired  Scientific laboratory technician  . Financial resource strain: Not hard at all  . Food insecurity    Worry: Never true    Inability: Never true  . Transportation needs    Medical: No    Non-medical: No  Tobacco Use  . Smoking status: Former Smoker    Years: 36.00    Quit date: 01/02/1996    Years since quitting: 23.3  . Smokeless tobacco: Never Used  Substance and Sexual Activity  . Alcohol use: No  . Drug use: No  . Sexual activity: Never  Lifestyle  . Physical activity    Days per week: 0 days    Minutes per session: 0 min  . Stress: Not at all  Relationships  . Social Herbalist on phone: Patient refused    Gets together: Patient refused    Attends religious service: Patient refused    Active  member of club or organization: Patient refused    Attends meetings of clubs or organizations: Patient refused    Relationship status: Patient refused  . Intimate partner violence    Fear of current or ex partner: Patient refused    Emotionally abused: Patient refused    Physically abused: Patient refused    Forced sexual activity: Patient refused  Other Topics Concern  . Not on file  Social History Narrative   Oldest son was killed in 2004 from getting hit by a train.     Past Medical History:  Diagnosis Date  . Cancer ([place])    uterine  . Diabetes mellitus without complication ([place])   . Hypercholesterolemia   . Thyroid disease      Patient Active Problem List   Diagnosis Date Noted  . Age-related osteoporosis without current pathological fracture 04/19/2018  . 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 H/O non-Hodgkin's lymphoma 01/23/2017  . History of deep vein thrombosis (DVT) of lower extremity 01/23/2017  . Back pain 08/04/2016  . Diabetes ([place]) 08/04/2016  . Protein-calorie malnutrition, severe 08/04/2016  . Gonalgia 08/25/2015  . Contact dermatitis 08/11/2015  . Atypical nevus 05/29/2015  . Shoulder pain, left 05/29/2015  . Hypercholesteremia 05/26/2015  . 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 H/O  ear disorder 06/04/2014  . History of uterine cancer 06/04/2014  . Avitaminosis D 11/20/2009  . Acquired hypothyroidism 11/26/2007  . Barrett esophagus 11/23/2007  . Narrowing of intervertebral disc space 11/23/2007  . Allergic rhinitis 11/22/2007  . Arthritis, degenerative 11/22/2007  . Disease of airway 11/22/2007    Past Surgical History:  Procedure Laterality Date  . ABDOMINAL HYSTERECTOMY    . CATARACT EXTRACTION    . REPLACEMENT TOTAL KNEE     left knee  . ROTATOR CUFF REPAIR Left     Her family history includes Alzheimer's disease in her mother; Diabetes in her brother; Heart  attack in her son; Hypertension in her son; Osteoarthritis in her brother. There is no history of Breast cancer.   Current Outpatient Medications:  .  [facility] CALCIUM 600 MG tablet, Take 1 tablet by mouth 2 (two) times daily with a meal., Disp: , Rfl: 11 .  levothyroxine (SYNTHROID) 75 MCG tablet, TAKE 1 TABLET BY MOUTH EVERY DAY, Disp: 90 tablet, Rfl: 1 .  simvastatin (ZOCOR) 10 MG tablet, TAKE 1 TABLET BY MOUTH EVERYDAY AT BEDTIME, Disp: 90 tablet, Rfl: 3 .  Vitamin D, Ergocalciferol, (DRISDOL) 1.25 MG (50000 UT) CAPS capsule, TAKE 1 CAPSULE (50,000 UNITS TOTAL) BY MOUTH EVERY 30 (THIRTY) DAYS., Disp: 3 capsule, Rfl: 3 .  XARELTO 10 MG TABS tablet, TO BEGIN ON November 09, 2017 TAKING 1 TAB BY MOUTH DAILY AS DIRECTED, Disp: , Rfl: 3  Patient Care Team: Mar Daring, PA-C as PCP - General (Family Medicine) Delgaizo, Lavon Paganini, MD as Referring Physician (Orthopedic Surgery) Key, Kelli Churn, MD as Referring Physician (Oncology)     Objective:    Vitals: BP (!) 154/84 (BP Location: Left Arm, Patient Position: Sitting, Cuff Size: Normal)   Pulse 91   Temp 98.1 F (36.7 C) (Oral)   Ht 4\' 8"  (1.422 m)   Wt 118 lb 9.6 oz (53.8 kg)   BMI 26.59 kg/m   Physical Exam Constitutional:      Appearance: Normal appearance.  HENT:     Head: Normocephalic and atraumatic.     Right Ear: Tympanic membrane, ear canal and external ear normal.     Left Ear: Tympanic membrane, ear canal and external ear normal.     Nose: Nose normal.     Mouth/Throat:     Mouth: Mucous membranes are moist.     Pharynx: Oropharynx is clear.  Eyes:     Extraocular Movements: Extraocular movements intact.     Conjunctiva/sclera: Conjunctivae normal.     Pupils: Pupils are equal, round, and reactive to light.  Neck:     Musculoskeletal: Normal range of motion and neck supple.  Cardiovascular:     Rate and Rhythm: Normal rate and regular rhythm.     Pulses: Normal pulses.     Heart sounds: Normal heart sounds.   Pulmonary:     Effort: Pulmonary effort is normal.     Breath sounds: Normal breath sounds.  Abdominal:     General: Bowel sounds are normal.  Musculoskeletal: Normal range of motion.  Skin:    General: Skin is warm and dry.  Neurological:     Mental Status: She is alert and oriented to person, place, and time.  Psychiatric:        Mood and Affect: Mood normal.        Behavior: Behavior normal.        Thought Content: Thought content normal.        Judgment:  Judgment normal.     Activities of Daily Living In your present state of health, do you have any difficulty performing the following activities: 01/28/2019  Hearing? N  Vision? N  Difficulty concentrating or making decisions? N  Walking or climbing stairs? Y  Comment Due to back pains.   Dressing or bathing? N  Doing errands, shopping? N  Preparing Food and eating ? N  Using the Toilet? N  In the past six months, have you accidently leaked urine? N  Do you have problems with loss of bowel control? N  Managing your Medications? N  Managing your Finances? N  Housekeeping or managing your Housekeeping? N  Some recent data might be hidden    Fall Risk Assessment Fall Risk  01/28/2019 01/22/2018 01/20/2017 01/20/2016  Falls in the past year? 0 Yes Yes Yes  Number falls in past yr: - 2 or more 1 1  Injury with Fall? - No Yes Yes  Comment - - will have knee surgery in the future -  Risk for fall due to : - Other (Comment) - -  Risk for fall due to: Comment - prior to total knee replacement in 05/2018 - -  Follow up - Falls prevention discussed Falls prevention discussed -     Depression Screen PHQ 2/9 Scores 01/28/2019 01/22/2018 01/20/2017 01/20/2016  PHQ - 2 Score 0 0 0 0  PHQ- 9 Score 0 - - -    6CIT Screen 01/28/2019  What Year? 0 points  What month? 0 points  What time? 0 points  Count back from 20 0 points  Months in reverse 0 points  Repeat phrase 0 points  Total Score 0       Assessment & Plan:    Annual  Physical Reviewed patient's Family Medical History Reviewed and updated list of patient's medical providers Assessment of cognitive impairment was done Assessed patient's functional ability Established a written schedule for health screening [place] Completed and Reviewed  Exercise Activities and Dietary recommendations Goals    . Increase water intake     Continue drinking 6-8 glasses of water a day.    . Prevent falls     Discussed ways to avoid falls inside and outside the house.        Immunization History  Administered Date(s) Administered  . Influenza, High Dose Seasonal PF 09/03/2014, 08/09/2016  . Influenza,inj,Quad PF,6+ Mos 06/24/2013  . Pneumococcal Conjugate-13 05/14/2014  . Pneumococcal Polysaccharide-23 05/27/2011  . Td 10/03/1997  . Tdap 05/27/2011, 11/21/2015  . Zoster 02/24/2012    Health Maintenance  Topic Date Due  . FOOT EXAM  10/22/1951  . URINE MICROALBUMIN  10/22/1951  . OPHTHALMOLOGY EXAM  06/03/2017  . HEMOGLOBIN A1C  07/22/2017  . INFLUENZA VACCINE  05/04/2019  . DEXA SCAN  04/11/2020  . TETANUS/TDAP  11/20/2025  . PNA vac Low Risk Adult  Completed     Discussed health benefits of physical activity, and encouraged her to engage in regular exercise appropriate for her age and condition.    1. Annual physical exam Normal physical exam today. Will check labs as below and f/u pending lab results. If labs are stable and WNL she will not need to have these rechecked for one year at her next annual physical exam. She is to call the office in the meantime if she has any acute issue, questions or concerns.  2. Breast cancer screening Has mammogram scheduled 04/29/19.   3. Acquired hypothyroidism Stable. Continue levothyroxine  67mcg. Will check labs as below and f/u pending results. - CBC with Differential/Platelet - Comprehensive metabolic panel - TSH  4. Type 2 diabetes mellitus without complication, without long-term  current use of insulin (HCC) Stable. Diet controlled. Will check labs as below and f/u pending results. - CBC with Differential/Platelet - Comprehensive metabolic panel - Lipid panel - Hemoglobin A1c  5. History of deep vein thrombosis (DVT) of lower extremity Stable. Diagnosis pulled for medication refill. Continue current medical treatment plan. - XARELTO 10 MG TABS tablet; Take 1 tablet (10 mg total) by mouth daily.  Dispense: 30 tablet; Refill: 11 - CBC with Differential/Platelet - Comprehensive metabolic panel  6. H/O non-Hodgkin's lymphoma Will check labs as below and f/u pending results. - CBC with Differential/Platelet - Comprehensive metabolic panel  7. Hypercholesteremia Stable. Continue simvastatin 10mg . Will check labs as below and f/u pending results. - CBC with Differential/Platelet - Comprehensive metabolic panel - Lipid panel - Hemoglobin A1c  8. Bunionette of right foot Has had for a while but worsening. Saw Dr. Milinda Pointer, but would prefer a second opinion. Referral to Dr. Barkley Bruns at [facility].  - Ambulatory referral to Podiatry  9. Overlapping toe, acquired, right See above medical treatment plan. - Ambulatory referral to Podiatry  ------------------------------------------------------------------------------------------------------------    Mar Daring, PA-C  [facility]

## 2019-04-17 ENCOUNTER — Ambulatory Visit (INDEPENDENT_AMBULATORY_CARE_PROVIDER_SITE_OTHER): Payer: Medicare Other | Admitting: Physician Assistant

## 2019-04-17 ENCOUNTER — Other Ambulatory Visit: Payer: Self-pay

## 2019-04-17 ENCOUNTER — Encounter: Payer: Self-pay | Admitting: Physician Assistant

## 2019-04-17 VITALS — BP 154/84 | HR 91 | Temp 98.1°F | Ht <= 58 in | Wt 118.6 lb

## 2019-04-17 DIAGNOSIS — Z86718 Personal history of other venous thrombosis and embolism: Secondary | ICD-10-CM

## 2019-04-17 DIAGNOSIS — Z0000 Encounter for general adult medical examination without abnormal findings: Secondary | ICD-10-CM

## 2019-04-17 DIAGNOSIS — Z1239 Encounter for other screening for malignant neoplasm of breast: Secondary | ICD-10-CM | POA: Diagnosis not present

## 2019-04-17 DIAGNOSIS — E119 Type 2 diabetes mellitus without complications: Secondary | ICD-10-CM | POA: Diagnosis not present

## 2019-04-17 DIAGNOSIS — E039 Hypothyroidism, unspecified: Secondary | ICD-10-CM | POA: Diagnosis not present

## 2019-04-17 DIAGNOSIS — E7800 Pure hypercholesterolemia, unspecified: Secondary | ICD-10-CM

## 2019-04-17 DIAGNOSIS — M21621 Bunionette of right foot: Secondary | ICD-10-CM

## 2019-04-17 DIAGNOSIS — Z8572 Personal history of non-Hodgkin lymphomas: Secondary | ICD-10-CM

## 2019-04-17 DIAGNOSIS — M205X1 Other deformities of toe(s) (acquired), right foot: Secondary | ICD-10-CM

## 2019-04-17 MED ORDER — XARELTO 10 MG PO TABS: 10.0000 mg | ORAL_TABLET | Freq: Every day | ORAL | 11 refills | Status: DC

## 2019-04-17 NOTE — Patient Instructions (Signed)
Health Maintenance After Age 77 After age 77, you are at a higher risk for certain long-term diseases and infections as well as injuries from falls. Falls are a major cause of broken bones and head injuries in people who are older than age 77. Getting regular preventive care can help to keep you healthy and well. Preventive care includes getting regular testing and making lifestyle changes as recommended by your health care provider. Talk with your health care provider about:  Which screenings and tests you should have. A screening is a test that checks for a disease when you have no symptoms.  A diet and exercise plan that is right for you. What should I know about screenings and tests to prevent falls? Screening and testing are the best ways to find a health problem early. Early diagnosis and treatment give you the best chance of managing medical conditions that are common after age 77. Certain conditions and lifestyle choices may make you more likely to have a fall. Your health care provider may recommend:  Regular vision checks. Poor vision and conditions such as cataracts can make you more likely to have a fall. If you wear glasses, make sure to get your prescription updated if your vision changes.  Medicine review. Work with your health care provider to regularly review all of the medicines you are taking, including over-the-counter medicines. Ask your health care provider about any side effects that may make you more likely to have a fall. Tell your health care provider if any medicines that you take make you feel dizzy or sleepy.  Osteoporosis screening. Osteoporosis is a condition that causes the bones to get weaker. This can make the bones weak and cause them to break more easily.  Blood pressure screening. Blood pressure changes and medicines to control blood pressure can make you feel dizzy.  Strength and balance checks. Your health care provider may recommend certain tests to check your  strength and balance while standing, walking, or changing positions.  Foot health exam. Foot pain and numbness, as well as not wearing proper footwear, can make you more likely to have a fall.  Depression screening. You may be more likely to have a fall if you have a fear of falling, feel emotionally low, or feel unable to do activities that you used to do.  Alcohol use screening. Using too much alcohol can affect your balance and may make you more likely to have a fall. What actions can I take to lower my risk of falls? General instructions  Talk with your health care provider about your risks for falling. Tell your health care provider if: ? You fall. Be sure to tell your health care provider about all falls, even ones that seem minor. ? You feel dizzy, sleepy, or off-balance.  Take over-the-counter and prescription medicines only as told by your health care provider. These include any supplements.  Eat a healthy diet and maintain a healthy weight. A healthy diet includes low-fat dairy products, low-fat (lean) meats, and fiber from whole grains, beans, and lots of fruits and vegetables. Home safety  Remove any tripping hazards, such as rugs, cords, and clutter.  Install safety equipment such as grab bars in bathrooms and safety rails on stairs.  Keep rooms and walkways well-lit. Activity   Follow a regular exercise program to stay fit. This will help you maintain your balance. Ask your health care provider what types of exercise are appropriate for you.  If you need a cane or   walker, use it as recommended by your health care provider.  Wear supportive shoes that have nonskid soles. Lifestyle  Do not drink alcohol if your health care provider tells you not to drink.  If you drink alcohol, limit how much you have: ? 0-1 drink a day for women. ? 0-2 drinks a day for men.  Be aware of how much alcohol is in your drink. In the [place] one drink equals one typical bottle of beer (12  oz), one-half glass of wine (5 oz), or one shot of hard liquor (1 oz).  Do not use any products that contain nicotine or tobacco, such as cigarettes and e-cigarettes. If you need help quitting, ask your health care provider. Summary  Having a healthy lifestyle and getting preventive care can help to protect your health and wellness after age 77.  Screening and testing are the best way to find a health problem early and help you avoid having a fall. Early diagnosis and treatment give you the best chance for managing medical conditions that are more common for people who are older than age 77.  Falls are a major cause of broken bones and head injuries in people who are older than age 77. Take precautions to prevent a fall at home.  Work with your health care provider to learn what changes you can make to improve your health and wellness and to prevent falls. This information is not intended to replace advice given to you by your health care provider. Make sure you discuss any questions you have with your health care provider. Document Released: 08/02/2017 Document Revised: 01/10/2019 Document Reviewed: 08/02/2017 Elsevier Patient Education  2020 Elsevier Inc.  

## 2019-04-18 DIAGNOSIS — E7800 Pure hypercholesterolemia, unspecified: Secondary | ICD-10-CM | POA: Diagnosis not present

## 2019-04-18 DIAGNOSIS — Z86718 Personal history of other venous thrombosis and embolism: Secondary | ICD-10-CM | POA: Diagnosis not present

## 2019-04-18 DIAGNOSIS — E119 Type 2 diabetes mellitus without complications: Secondary | ICD-10-CM | POA: Diagnosis not present

## 2019-04-18 DIAGNOSIS — Z8572 Personal history of non-Hodgkin lymphomas: Secondary | ICD-10-CM | POA: Diagnosis not present

## 2019-04-18 DIAGNOSIS — E039 Hypothyroidism, unspecified: Secondary | ICD-10-CM | POA: Diagnosis not present

## 2019-04-19 ENCOUNTER — Telehealth: Payer: Self-pay

## 2019-04-19 LAB — CBC WITH DIFFERENTIAL/PLATELET
Basophils Absolute: 0.1 10*3/uL (ref 0.0–0.2)
Basos: 1 %
EOS (ABSOLUTE): 0.1 10*3/uL (ref 0.0–0.4)
Eos: 3 %
Hematocrit: 36.5 % (ref 34.0–46.6)
Hemoglobin: 12.6 g/dL (ref 11.1–15.9)
Immature Grans (Abs): 0 10*3/uL (ref 0.0–0.1)
Immature Granulocytes: 0 %
Lymphocytes Absolute: 1.2 10*3/uL (ref 0.7–3.1)
Lymphs: 28 %
MCH: 30.1 pg (ref 26.6–33.0)
MCHC: 34.5 g/dL (ref 31.5–35.7)
MCV: 87 fL (ref 79–97)
Monocytes Absolute: 0.4 10*3/uL (ref 0.1–0.9)
Monocytes: 9 %
Neutrophils Absolute: 2.5 10*3/uL (ref 1.4–7.0)
Neutrophils: 59 %
Platelets: 173 10*3/uL (ref 150–450)
RBC: 4.19 x10E6/uL (ref 3.77–5.28)
RDW: 15 % (ref 11.7–15.4)
WBC: 4.3 10*3/uL (ref 3.4–10.8)

## 2019-04-19 LAB — TSH: TSH: 2 u[IU]/mL (ref 0.450–4.500)

## 2019-04-19 LAB — COMPREHENSIVE METABOLIC PANEL
ALT: 14 IU/L (ref 0–32)
AST: 20 IU/L (ref 0–40)
Albumin/Globulin Ratio: 1.8 (ref 1.2–2.2)
Albumin: 4.5 g/dL (ref 3.7–4.7)
Alkaline Phosphatase: 54 IU/L (ref 39–117)
BUN/Creatinine Ratio: 21 (ref 12–28)
BUN: 15 mg/dL (ref 8–27)
Bilirubin Total: 0.5 mg/dL (ref 0.0–1.2)
CO2: 20 mmol/L (ref 20–29)
Calcium: 8.9 mg/dL (ref 8.7–10.3)
Chloride: 101 mmol/L (ref 96–106)
Creatinine, Ser: 0.7 mg/dL (ref 0.57–1.00)
GFR calc Af Amer: 97 mL/min/{1.73_m2} (ref >59)
GFR calc non Af Amer: 84 mL/min/{1.73_m2} (ref >59)
Globulin, Total: 2.5 g/dL (ref 1.5–4.5)
Glucose: 116 mg/dL — ABNORMAL HIGH (ref 65–99)
Potassium: 4.2 mmol/L (ref 3.5–5.2)
Sodium: 140 mmol/L (ref 134–144)
Total Protein: 7 g/dL (ref 6.0–8.5)

## 2019-04-19 LAB — HEMOGLOBIN A1C
Est. average glucose Bld gHb Est-mCnc: 120 mg/dL
Hgb A1c MFr Bld: 5.8 % — ABNORMAL HIGH (ref 4.8–5.6)

## 2019-04-19 LAB — LIPID PANEL
Chol/HDL Ratio: 2.7 ratio (ref 0.0–4.4)
Cholesterol, Total: 193 mg/dL (ref 100–199)
HDL: 72 mg/dL (ref >39)
LDL Calculated: 99 mg/dL (ref 0–99)
Triglycerides: 109 mg/dL (ref 0–149)
VLDL Cholesterol Cal: 22 mg/dL (ref 5–40)

## 2019-04-19 NOTE — Telephone Encounter (Signed)
-----   Message from Mar Daring, [place] sent at 04/19/2019 10:24 AM EDT ----- Blood count is normal. Kidney and liver function are normal. Cholesterol is normal. A1c has improved to 5.8 from 6.0. Thyroid is normal.

## 2019-04-19 NOTE — Telephone Encounter (Signed)
Patient advised as directed below. 

## 2019-04-29 ENCOUNTER — Ambulatory Visit
Admission: RE | Admit: 2019-04-29 | Discharge: 2019-04-29 | Disposition: A | Discharge status: Home / Self Care | Payer: Medicare Other | Source: Ambulatory Visit | Attending: Physician Assistant | Admitting: Physician Assistant

## 2019-04-29 DIAGNOSIS — Z1231 Encounter for screening mammogram for malignant neoplasm of breast: Secondary | ICD-10-CM | POA: Insufficient documentation

## 2019-04-30 ENCOUNTER — Telehealth: Payer: Self-pay

## 2019-04-30 NOTE — Telephone Encounter (Signed)
Tried to call patient regarding mammogram results. Unable to leave a message.

## 2019-04-30 NOTE — Telephone Encounter (Signed)
-----   Message from Mar Daring, [place] sent at 04/29/2019  5:02 PM EDT ----- Normal mammogram. Repeat screening in one year.

## 2019-05-03 DIAGNOSIS — M79671 Pain in right foot: Secondary | ICD-10-CM | POA: Diagnosis not present

## 2019-05-03 DIAGNOSIS — M21621 Bunionette of right foot: Secondary | ICD-10-CM | POA: Diagnosis not present

## 2019-06-27 ENCOUNTER — Ambulatory Visit (INDEPENDENT_AMBULATORY_CARE_PROVIDER_SITE_OTHER): Payer: Medicare Other | Admitting: Physician Assistant

## 2019-06-27 ENCOUNTER — Other Ambulatory Visit: Payer: Self-pay

## 2019-06-27 ENCOUNTER — Encounter: Payer: Self-pay | Admitting: Physician Assistant

## 2019-06-27 VITALS — BP 133/76 | HR 86 | Temp 96.9°F | Resp 16 | Wt 121.0 lb

## 2019-06-27 DIAGNOSIS — M545 Low back pain, unspecified: Secondary | ICD-10-CM

## 2019-06-27 DIAGNOSIS — M62838 Other muscle spasm: Secondary | ICD-10-CM | POA: Diagnosis not present

## 2019-06-27 DIAGNOSIS — W19XXXA Unspecified fall, initial encounter: Secondary | ICD-10-CM

## 2019-06-27 DIAGNOSIS — M25552 Pain in left hip: Secondary | ICD-10-CM | POA: Diagnosis not present

## 2019-06-27 DIAGNOSIS — M25531 Pain in right wrist: Secondary | ICD-10-CM

## 2019-06-27 DIAGNOSIS — M25532 Pain in left wrist: Secondary | ICD-10-CM

## 2019-06-27 MED ORDER — BACLOFEN 10 MG PO TABS: 10.0000 mg | ORAL_TABLET | Freq: Three times a day (TID) | ORAL | 0 refills | Status: DC

## 2019-06-27 MED ORDER — METHYLPREDNISOLONE 4 MG PO TBPK: ORAL_TABLET | ORAL | 0 refills | Status: DC

## 2019-06-27 NOTE — Progress Notes (Signed)
Patient: Sherry Jones Female    DOB: January 25, 1941   78 y.o.   MRN: [id number] Visit Date: 06/27/2019  Today's Provider: Mar Daring, PA-C   Chief Complaint  Patient presents with  . Fall  . Hip Pain   Subjective:     Fall The accident occurred 12 to 24 hours ago. The fall occurred while walking. She fell from a height of 3 to 5 ft. The point of impact was the right knee, left knee, left wrist and right wrist. The pain is present in the left knee and right knee. Pertinent negatives include no abdominal pain, fever, nausea, numbness or vomiting.    Patient fell yesterday.while walking in her her driveway. Patient fell on her hands and knees. Patient states her right knee has been hurting since. Patient states she already had left knee pain. Some swelling in right knee, but not from fall.  Patient also has had left hip pain for almost 2 weeks. Patient states pain in hip is worse when she lays on it or when she walks. Patient has been taking otc tylenol with mild relief.  Patient also wants to discuss bilateral wrist pain and swelling, which she has had for about 1 year.    Allergies  Allergen Reactions  . Cefadroxil   . Cefuroxime   . Cefuroxime Axetil     Other reaction(s): Unknown Other reaction(s): NAUSEA  . Cephalosporins Other (See Comments)  . Ciprofloxacin Other (See Comments)  . Codeine Swelling  . Hydrocodone-Acetaminophen Other (See Comments)    Other reaction(s): Other (See Comments) Other reaction(s): Unknown Other reaction(s): Other (See Comments)  . Levofloxacin Other (See Comments)  . Meloxicam   . Oxycodone     Other reaction(s): Syncope  . Tramadol     Other reaction(s): Unknown  . Gabapentin Nausea And Vomiting  . Macrolides And Ketolides     Other reaction(s): Other (See Comments) Other Reaction: Intolerance Other reaction(s): Other (See Comments) Other Reaction: Intolerance Other reaction(s): Other (See Comments) Other Reaction:  Intolerance Other reaction(s): Other (See Comments) Other Reaction: Intolerance  . Methocarbamol Rash  . Nitrofuran Derivatives Other (See Comments)    Other Reaction: Intolerance  . Nitrofurantoin     Other reaction(s): Other (See Comments) Other Reaction: Intolerance Other reaction(s): Other (See Comments) Other Reaction: Intolerance     Current Outpatient Medications:  .  CVS CALCIUM 600 MG tablet, Take 1 tablet by mouth 2 (two) times daily with a meal., Disp: , Rfl: 11 .  levothyroxine (SYNTHROID) 75 MCG tablet, TAKE 1 TABLET BY MOUTH EVERY DAY, Disp: 90 tablet, Rfl: 1 .  simvastatin (ZOCOR) 10 MG tablet, TAKE 1 TABLET BY MOUTH EVERYDAY AT BEDTIME, Disp: 90 tablet, Rfl: 3 .  Vitamin D, Ergocalciferol, (DRISDOL) 1.25 MG (50000 UT) CAPS capsule, TAKE 1 CAPSULE (50,000 UNITS TOTAL) BY MOUTH EVERY 30 (THIRTY) DAYS., Disp: 3 capsule, Rfl: 3 .  XARELTO 10 MG TABS tablet, Take 1 tablet (10 mg total) by mouth daily. (Patient not taking: Reported on 06/27/2019), Disp: 30 tablet, Rfl: 11  Review of Systems  Constitutional: Negative for appetite change, chills, fatigue and fever.  Respiratory: Negative for chest tightness and shortness of breath.   Cardiovascular: Negative for chest pain and palpitations.  Gastrointestinal: Negative for abdominal pain, nausea and vomiting.  Musculoskeletal: Positive for arthralgias, back pain, gait problem and myalgias.  Neurological: Negative for dizziness, weakness and numbness.    Social History   Tobacco Use  . Smoking  status: Former Smoker    Years: 36.00    Quit date: 01/02/1996    Years since quitting: 23.4  . Smokeless tobacco: Never Used  Substance Use Topics  . Alcohol use: No      Objective:   BP 133/76 (BP Location: Left Arm, Patient Position: Sitting, Cuff Size: Large)   Pulse 86   Temp (!) 96.9 F (36.1 C) (Other (Comment))   Resp 16   Wt 121 lb (54.9 kg)   SpO2 97%   BMI 27.13 kg/m  Vitals:   06/27/19 1504  BP: 133/76   Pulse: 86  Resp: 16  Temp: (!) 96.9 F (36.1 C)  TempSrc: Other (Comment)  SpO2: 97%  Weight: 121 lb (54.9 kg)  Body mass index is 27.13 kg/m.   Physical Exam Vitals signs reviewed.  Constitutional:      General: She is not in acute distress.    Appearance: Normal appearance. She is well-developed. She is not ill-appearing or diaphoretic.  Neck:     Musculoskeletal: Normal range of motion and neck supple.     Thyroid: No thyromegaly.     Vascular: No JVD.     Trachea: No tracheal deviation.  Cardiovascular:     Rate and Rhythm: Normal rate and regular rhythm.     Heart sounds: Normal heart sounds. No murmur. No friction rub. No gallop.   Pulmonary:     Effort: Pulmonary effort is normal. No respiratory distress.     Breath sounds: Normal breath sounds. No wheezing or rales.  Musculoskeletal:     Right wrist: Normal.     Left wrist: Normal.     Right knee: She exhibits swelling. She exhibits normal range of motion, no LCL laxity, normal patellar mobility, no bony tenderness, normal meniscus and no MCL laxity. No tenderness found.     Left knee: She exhibits decreased range of motion (decreased flexion since her TKR).  Lymphadenopathy:     Cervical: No cervical adenopathy.  Neurological:     Mental Status: She is alert.      No results found for any visits on 06/27/19.     Assessment & Plan    1. Fall, initial encounter Discussed getting imaging but patient declines stating she does not think she has fractured anything.   2. Left hip pain Will give medrol dose pak as below for the acute aggravations in her knees, hip and hands. Call if worsening. - methylPREDNISolone (MEDROL) 4 MG TBPK tablet; 6 day taper; take as directed instructions  Dispense: 21 tablet; Refill: 0  3. Acute left-sided low back pain without sciatica See above medical treatment plan. - methylPREDNISolone (MEDROL) 4 MG TBPK tablet; 6 day taper; take as directed instructions  Dispense: 21 tablet;  Refill: 0  4. Muscle spasm Stiffness and pain noted in the left lumbar spine as well. Will give baclofen as below for spasms. Call if worsening.  - baclofen (LIORESAL) 10 MG tablet; Take 1 tablet (10 mg total) by mouth 3 (three) times daily.  Dispense: 30 each; Refill: 0  5. Pain in both wrists Suspect arthritis with aggravation due to recent fall. Will see if medrol helps decrease symptoms. She already is established with ortho and may call them if symptoms fail to improve.      Mar Daring, PA-C  [facility]

## 2019-06-27 NOTE — Patient Instructions (Signed)
Hip Exercises Ask your health care provider which exercises are safe for you. Do exercises exactly as told by your health care provider and adjust them as directed. It is normal to feel mild stretching, pulling, tightness, or discomfort as you do these exercises. Stop right away if you feel sudden pain or your pain gets worse. Do not begin these exercises until told by your health care provider. Stretching and range-of-motion exercises These exercises warm up your muscles and joints and improve the movement and flexibility of your hip. These exercises also help to relieve pain, numbness, and tingling. You may be asked to limit your range of motion if you had a hip replacement. Talk to your health care provider about these restrictions. Hamstrings, supine  1. Lie on your back (supine position). 2. Loop a belt or towel over the ball of your left / right foot. The ball of your foot is on the walking surface, right under your toes. 3. Straighten your left / right knee and slowly pull on the belt or towel to raise your leg until you feel a gentle stretch behind your knee (hamstring). ? Do not let your knee bend while you do this. ? Keep your other leg flat on the floor. 4. Hold this position for __________ seconds. 5. Slowly return your leg to the starting position. Repeat __________ times. Complete this exercise __________ times a day. Hip rotation  1. Lie on your back on a firm surface. 2. With your left / right hand, gently pull your left / right knee toward the shoulder that is on the same side of the body. Stop when your knee is pointing toward the ceiling. 3. Hold your left / right ankle with your other hand. 4. Keeping your knee steady, gently pull your left / right ankle toward your other shoulder until you feel a stretch in your buttocks. ? Keep your hips and shoulders firmly planted while you do this stretch. 5. Hold this position for __________ seconds. Repeat __________ times. Complete  this exercise __________ times a day. Seated stretch This exercise is sometimes called hamstrings and adductors stretch. 1. Sit on the floor with your legs stretched wide. Keep your knees straight during this exercise. 2. Keeping your head and back in a straight line, bend at your waist to reach for your left foot (position A). You should feel a stretch in your right inner thigh (adductors). 3. Hold this position for __________ seconds. Then slowly return to the upright position. 4. Keeping your head and back in a straight line, bend at your waist to reach forward (position B). You should feel a stretch behind both of your thighs and knees (hamstrings). 5. Hold this position for __________ seconds. Then slowly return to the upright position. 6. Keeping your head and back in a straight line, bend at your waist to reach for your right foot (position C). You should feel a stretch in your left inner thigh (adductors). 7. Hold this position for __________ seconds. Then slowly return to the upright position. Repeat __________ times. Complete this exercise __________ times a day. Lunge This exercise stretches the muscles of the hip (hip flexors). 1. Place your left / right knee on the floor and bend your other knee so that is directly over your ankle. You should be half-kneeling. 2. Keep good posture with your head over your shoulders. 3. Tighten your buttocks to point your tailbone downward. This will prevent your back from arching too much. 4. You should feel a   gentle stretch in the front of your left / right thigh and hip. If you do not feel a stretch, slide your other foot forward slightly and then slowly lunge forward with your chest up until your knee once again lines up over your ankle. ? Make sure your tailbone continues to point downward. 5. Hold this position for __________ seconds. 6. Slowly return to the starting position. Repeat __________ times. Complete this exercise __________ times a  day. Strengthening exercises These exercises build strength and endurance in your hip. Endurance is the ability to use your muscles for a long time, even after they get tired. Bridge This exercise strengthens the muscles of your hip (hip extensors). 1. Lie on your back on a firm surface with your knees bent and your feet flat on the floor. 2. Tighten your buttocks muscles and lift your bottom off the floor until the trunk of your body and your hips are level with your thighs. ? Do not arch your back. ? You should feel the muscles working in your buttocks and the back of your thighs. If you do not feel these muscles, slide your feet 1-2 inches (2.5-5 cm) farther away from your buttocks. 3. Hold this position for __________ seconds. 4. Slowly lower your hips to the starting position. 5. Let your muscles relax completely between repetitions. Repeat __________ times. Complete this exercise __________ times a day. Straight leg raises, side-lying This exercise strengthens the muscles that move the hip joint away from the center of the body (hip abductors). 1. Lie on your side with your left / right leg in the top position. Lie so your head, shoulder, hip, and knee line up. You may bend your bottom knee slightly to help you balance. 2. Roll your hips slightly forward, so your hips are stacked directly over each other and your left / right knee is facing forward. 3. Leading with your heel, lift your top leg 4-6 inches (10-15 cm). You should feel the muscles in your top hip lifting. ? Do not let your foot drift forward. ? Do not let your knee roll toward the ceiling. 4. Hold this position for __________ seconds. 5. Slowly return to the starting position. 6. Let your muscles relax completely between repetitions. Repeat __________ times. Complete this exercise __________ times a day. Straight leg raises, side-lying This exercise strengthen the muscles that move the hip joint toward the center of the  body (hip adductors). 1. Lie on your side with your left / right leg in the bottom position. Lie so your head, shoulder, hip, and knee line up. You may place your upper foot in front to help you balance. 2. Roll your hips slightly forward, so your hips are stacked directly over each other and your left / right knee is facing forward. 3. Tense the muscles in your inner thigh and lift your bottom leg 4-6 inches (10-15 cm). 4. Hold this position for __________ seconds. 5. Slowly return to the starting position. 6. Let your muscles relax completely between repetitions. Repeat __________ times. Complete this exercise __________ times a day. Straight leg raises, supine This exercise strengthens the muscles in the front of your thigh (quadriceps). 1. Lie on your back (supine position) with your left / right leg extended and your other knee bent. 2. Tense the muscles in the front of your left / right thigh. You should see your kneecap slide up or see increased dimpling just above your knee. 3. Keep these muscles tight as you raise your   leg 4-6 inches (10-15 cm) off the floor. Do not let your knee bend. 4. Hold this position for __________ seconds. 5. Keep these muscles tense as you lower your leg. 6. Relax the muscles slowly and completely between repetitions. Repeat __________ times. Complete this exercise __________ times a day. Hip abductors, standing This exercise strengthens the muscles that move the leg and hip joint away from the center of the body (hip abductors). 1. Tie one end of a rubber exercise band or tubing to a secure surface, such as a chair, table, or pole. 2. Loop the other end of the band or tubing around your left / right ankle. 3. Keeping your ankle with the band or tubing directly opposite the secured end, step away until there is tension in the tubing or band. Hold on to a chair, table, or pole as needed for balance. 4. Lift your left / right leg out to your side. While you do  this: ? Keep your back upright. ? Keep your shoulders over your hips. ? Keep your toes pointing forward. ? Make sure to use your hip muscles to slowly lift your leg. Do not tip your body or forcefully lift your leg. 5. Hold this position for __________ seconds. 6. Slowly return to the starting position. Repeat __________ times. Complete this exercise __________ times a day. Squats This exercise strengthens the muscles in the front of your thigh (quadriceps). 1. Stand in a door frame so your feet and knees are in line with the frame. You may place your hands on the frame for balance. 2. Slowly bend your knees and lower your hips like you are going to sit in a chair. ? Keep your lower legs in a straight-up-and-down position. ? Do not let your hips go lower than your knees. ? Do not bend your knees lower than told by your health care provider. ? If your hip pain increases, do not bend as low. 3. Hold this position for ___________ seconds. 4. Slowly push with your legs to return to standing. Do not use your hands to pull yourself to standing. Repeat __________ times. Complete this exercise __________ times a day. This information is not intended to replace advice given to you by your health care provider. Make sure you discuss any questions you have with your health care provider. Document Released: 10/07/2005 Document Revised: 07/31/2018 Document Reviewed: 07/31/2018 Elsevier Patient Education  2020 [employer]  

## 2019-07-29 ENCOUNTER — Ambulatory Visit
Admission: RE | Admit: 2019-07-29 | Discharge: 2019-07-29 | Disposition: A | Discharge status: Home / Self Care | Payer: Medicare Other | Source: Ambulatory Visit | Attending: Family Medicine | Admitting: Family Medicine

## 2019-07-29 ENCOUNTER — Ambulatory Visit (INDEPENDENT_AMBULATORY_CARE_PROVIDER_SITE_OTHER): Payer: Medicare Other | Admitting: Family Medicine

## 2019-07-29 ENCOUNTER — Encounter: Payer: Self-pay | Admitting: Family Medicine

## 2019-07-29 ENCOUNTER — Other Ambulatory Visit: Payer: Self-pay

## 2019-07-29 VITALS — BP 145/74 | HR 94 | Temp 97.2°F | Resp 18 | Ht 59.0 in | Wt 121.0 lb

## 2019-07-29 DIAGNOSIS — I82412 Acute embolism and thrombosis of left femoral vein: Secondary | ICD-10-CM | POA: Diagnosis not present

## 2019-07-29 DIAGNOSIS — I82411 Acute embolism and thrombosis of right femoral vein: Secondary | ICD-10-CM

## 2019-07-29 DIAGNOSIS — M7989 Other specified soft tissue disorders: Secondary | ICD-10-CM | POA: Diagnosis not present

## 2019-07-29 DIAGNOSIS — M21621 Bunionette of right foot: Secondary | ICD-10-CM

## 2019-07-29 DIAGNOSIS — I82432 Acute embolism and thrombosis of left popliteal vein: Secondary | ICD-10-CM | POA: Diagnosis not present

## 2019-07-29 MED ORDER — RIVAROXABAN 20 MG PO TABS: 20.0000 mg | ORAL_TABLET | Freq: Every day | ORAL | 1 refills | Status: DC

## 2019-07-29 MED ORDER — RIVAROXABAN (XARELTO) VTE STARTER PACK (15 & 20 MG): ORAL_TABLET | ORAL | 0 refills | Status: DC

## 2019-07-29 NOTE — Patient Instructions (Signed)

## 2019-07-29 NOTE — Progress Notes (Addendum)
Disregard duplicate note

## 2019-07-30 DIAGNOSIS — I82411 Acute embolism and thrombosis of right femoral vein: Secondary | ICD-10-CM | POA: Insufficient documentation

## 2019-07-30 NOTE — Progress Notes (Signed)
Patient: Sherry Jones Female    DOB: 05-Jul-1941   78 y.o.   MRN: [id number] Visit Date: 07/30/2019  Today's Provider: Lavon Paganini, MD   Chief Complaint  Patient presents with  . Leg Pain    Leg swelling   Subjective:    I, Bretlyn Ward ,CMA am acting as a scribe for Dr. Lavon Paganini.   Leg Pain  The incident occurred 12 to 24 hours ago. There was no injury mechanism. The pain is present in the left leg. Quality: no pain but feels tight. The pain is at a severity of 0/10. The patient is experiencing no pain. Associated symptoms comments: Slight lower leg swelling . She has tried elevation for the symptoms. The treatment provided no relief.   Woke up with swelling yesterday AM in [facility].  Had h/o DVT in [facility] in 2012.  Was worried that this may be DVT.  Describes it as a discomfort and a tight feeling  Denies any wounds, redness, fevers, shortness of breath, tachycardia, palpitations, chest pain.  Allergies  Allergen Reactions  . Cefadroxil   . Cefuroxime   . Cefuroxime Axetil     Other reaction(s): Unknown Other reaction(s): NAUSEA  . Cephalosporins Other (See Comments)  . Ciprofloxacin Other (See Comments)  . Codeine Swelling  . Hydrocodone-Acetaminophen Other (See Comments)    Other reaction(s): Other (See Comments) Other reaction(s): Unknown Other reaction(s): Other (See Comments)  . Levofloxacin Other (See Comments)  . Meloxicam   . Oxycodone     Other reaction(s): Syncope  . Tramadol     Other reaction(s): Unknown  . Gabapentin Nausea And Vomiting  . Macrolides And Ketolides     Other reaction(s): Other (See Comments) Other Reaction: Intolerance Other reaction(s): Other (See Comments) Other Reaction: Intolerance Other reaction(s): Other (See Comments) Other Reaction: Intolerance Other reaction(s): Other (See Comments) Other Reaction: Intolerance  . Methocarbamol Rash  . Nitrofuran Derivatives Other (See Comments)    Other Reaction:  Intolerance  . Nitrofurantoin     Other reaction(s): Other (See Comments) Other Reaction: Intolerance Other reaction(s): Other (See Comments) Other Reaction: Intolerance     Current Outpatient Medications:  .  levothyroxine (SYNTHROID) 75 MCG tablet, TAKE 1 TABLET BY MOUTH EVERY DAY, Disp: 90 tablet, Rfl: 1 .  simvastatin (ZOCOR) 10 MG tablet, TAKE 1 TABLET BY MOUTH EVERYDAY AT BEDTIME, Disp: 90 tablet, Rfl: 3 .  Vitamin D, Ergocalciferol, (DRISDOL) 1.25 MG (50000 UT) CAPS capsule, TAKE 1 CAPSULE (50,000 UNITS TOTAL) BY MOUTH EVERY 30 (THIRTY) DAYS., Disp: 3 capsule, Rfl: 3 .  [employer] CALCIUM 600 MG tablet, Take 1 tablet by mouth 2 (two) times daily with a meal., Disp: , Rfl: 11 .  rivaroxaban (XARELTO) 20 MG TABS tablet, Take 1 tablet (20 mg total) by mouth daily with supper., Disp: 30 tablet, Rfl: 1 .  Rivaroxaban 15 & 20 MG TBPK, Follow package directions: Take one 15mg  tablet by mouth twice a day. On day 22, switch to one 20mg  tablet once a day. Take with food., Disp: 51 each, Rfl: 0  Review of Systems  Constitutional: Negative.   HENT: Negative.   Respiratory: Negative.   Cardiovascular: Positive for leg swelling. Negative for chest pain and palpitations.  Gastrointestinal: Negative.   Musculoskeletal: Negative.   Skin: Negative.   Neurological: Negative.   Psychiatric/Behavioral: Negative.     Social History   Tobacco Use  . Smoking status: Former Smoker    Years: 36.00    Quit date:  01/02/1996    Years since quitting: 23.5  . Smokeless tobacco: Never Used  Substance Use Topics  . Alcohol use: No      Objective:   BP (!) 145/74   Pulse 94   Temp (!) 97.2 F (36.2 C) (Temporal)   Resp 18   Ht 4\' 11"  (1.499 m)   Wt 121 lb (54.9 kg)   SpO2 99%   BMI 24.44 kg/m  Vitals:   07/29/19 1512  BP: (!) 145/74  Pulse: 94  Resp: 18  Temp: (!) 97.2 F (36.2 C)  TempSrc: Temporal  SpO2: 99%  Weight: 121 lb (54.9 kg)  Height: 4\' 11"  (1.499 m)  Body mass index is 24.44  kg/m.   Physical Exam Vitals signs reviewed.  Constitutional:      General: She is not in acute distress.    Appearance: Normal appearance. She is well-developed. She is not diaphoretic.  HENT:     Head: Normocephalic and atraumatic.  Eyes:     General: No scleral icterus.    Conjunctiva/sclera: Conjunctivae normal.  Neck:     Musculoskeletal: Neck supple.     Thyroid: No thyromegaly.  Cardiovascular:     Rate and Rhythm: Normal rate and regular rhythm.     Pulses: Normal pulses.     Heart sounds: Normal heart sounds. No murmur.  Pulmonary:     Effort: Pulmonary effort is normal. No respiratory distress.     Breath sounds: Normal breath sounds. No wheezing, rhonchi or rales.  Musculoskeletal:     Right lower leg: Edema (2+) present.     Left lower leg: No edema.     Comments: No calf tenderness.  Positive Homans' sign.  No palpable cords  Lymphadenopathy:     Cervical: No cervical adenopathy.  Skin:    General: Skin is warm and dry.     Capillary Refill: Capillary refill takes less than 2 seconds.     Findings: No rash.  Neurological:     Mental Status: She is alert and oriented to person, place, and time. Mental status is at baseline.  Psychiatric:        Mood and Affect: Mood normal.        Behavior: Behavior normal.    US Venous Img Lower Unilateral Left  Result Date: 07/29/2019 CLINICAL DATA:  Left leg swelling.  History of DVT. EXAM: LEFT LOWER EXTREMITY VENOUS DOPPLER ULTRASOUND TECHNIQUE: Gray-scale sonography with graded compression, as well as color Doppler and duplex ultrasound were performed to evaluate the lower extremity deep venous systems from the level of the common femoral vein and including the common femoral, femoral, profunda femoral, popliteal and calf veins including the posterior tibial, peroneal and gastrocnemius veins when visible. The superficial great saphenous vein was also interrogated. Spectral Doppler was utilized to evaluate flow at rest and  with distal augmentation maneuvers in the common femoral, femoral and popliteal veins. COMPARISON:  Report from 08/23/2002. FINDINGS: Contralateral Common Femoral Vein: No evidence of thrombus. Normal compressibility and color Doppler flow. Common Femoral Vein: No evidence of thrombus. Normal compressibility and color Doppler flow. Saphenofemoral Junction: No evidence of thrombus. Normal compressibility and flow on color Doppler imaging. Profunda Femoral Vein: No evidence of thrombus. Normal compressibility and flow on color Doppler imaging. Femoral Vein: Positive for thrombus. Echogenic thrombus in the left femoral vein. Left femoral vein thrombus is occlusive. Popliteal Vein: Positive for thrombus. Left popliteal vein is not compressible. Left popliteal vein thrombus appears to be occlusive. Calf Veins:  Limited evaluation. Concern for incomplete compressibility of the left posterior tibial veins. Normal compressibility in the visualized left peroneal veins. Other Findings:  None. IMPRESSION: Positive for deep venous thrombosis in the left lower extremity. Occlusive thrombus in the left femoral vein and left popliteal vein. Limited evaluation of the left deep calf veins. Electronically Signed   By: Markus Daft M.D.   On: 07/29/2019 16:58       Assessment & Plan   1. Acute deep vein thrombosis (DVT) of femoral vein of right lower extremity (HCC) -Patient with unilateral right calf swelling that is notably larger than the left calf that occurred without any injury -No signs of cellulitis -Patient was sent for stat Doppler of the right lower extremity that showed occlusive acute DVT -She was notified of this result and started on Xarelto after her visit -Discussed strict return precautions and ER precautions -Encouraged elevation -Advised her to follow-up with PCP in 1 month or sooner if she has any problems - US Venous Img Lower Unilateral Left; Future  2. 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 Rivaroxaban 15 & 20 MG TBPK    Sig: Follow package directions: Take one 15mg  tablet by mouth twice a day. On day 22, switch to one 20mg  tablet once a day. Take with food.    Dispense:  51 each    Refill:  0  . rivaroxaban (XARELTO) 20 MG TABS tablet    Sig: Take 1 tablet (20 mg total) by mouth daily with supper.    Dispense:  30 tablet    Refill:  1    To fill after starter pack     Return if symptoms worsen or fail to improve.   The entirety of the information documented in the History of Present Illness, Review of Systems and Physical Exam were personally obtained by me. Portions of this information were initially documented by [facility], CMA and reviewed by me for thoroughness and accuracy.    Arrielle Mcginn, Dionne Bucy, MD MPH [facility]

## 2019-08-01 DIAGNOSIS — D485 Neoplasm of uncertain behavior of skin: Secondary | ICD-10-CM | POA: Diagnosis not present

## 2019-08-01 DIAGNOSIS — L570 Actinic keratosis: Secondary | ICD-10-CM | POA: Diagnosis not present

## 2019-08-01 DIAGNOSIS — L578 Other skin changes due to chronic exposure to nonionizing radiation: Secondary | ICD-10-CM | POA: Diagnosis not present

## 2019-08-01 DIAGNOSIS — C4442 Squamous cell carcinoma of skin of scalp and neck: Secondary | ICD-10-CM | POA: Diagnosis not present

## 2019-08-14 ENCOUNTER — Ambulatory Visit: Payer: Medicare Other | Admitting: Podiatry

## 2019-08-19 DIAGNOSIS — C4442 Squamous cell carcinoma of skin of scalp and neck: Secondary | ICD-10-CM | POA: Diagnosis not present

## 2019-09-09 ENCOUNTER — Ambulatory Visit: Payer: Self-pay

## 2019-09-09 ENCOUNTER — Other Ambulatory Visit: Payer: Self-pay

## 2019-09-09 ENCOUNTER — Ambulatory Visit (INDEPENDENT_AMBULATORY_CARE_PROVIDER_SITE_OTHER): Payer: Medicare Other | Admitting: Family Medicine

## 2019-09-09 ENCOUNTER — Encounter: Payer: Self-pay | Admitting: Family Medicine

## 2019-09-09 VITALS — BP 131/71 | HR 93 | Temp 97.6°F | Wt 122.0 lb

## 2019-09-09 DIAGNOSIS — I8392 Asymptomatic varicose veins of left lower extremity: Secondary | ICD-10-CM | POA: Insufficient documentation

## 2019-09-09 DIAGNOSIS — I82411 Acute embolism and thrombosis of right femoral vein: Secondary | ICD-10-CM

## 2019-09-09 MED ORDER — RIVAROXABAN 20 MG PO TABS: 20.0000 mg | ORAL_TABLET | Freq: Every day | ORAL | 1 refills | Status: DC

## 2019-09-09 NOTE — Patient Instructions (Signed)
Varicose Veins Varicose veins are veins that have become enlarged, bulged, and twisted. They most often appear in the legs. What are the causes? This condition is caused by damage to the valves in the vein. These valves help blood return to your heart. When they are damaged and they stop working properly, blood may flow backward and back up in the veins near the skin, causing the veins to get larger and appear twisted. The condition can result from any issue that causes blood to back up, like pregnancy, prolonged standing, or obesity. What increases the risk? This condition is more likely to develop in people who are:  On their feet a lot.  Pregnant.  Overweight. What are the signs or symptoms? Symptoms of this condition include:  Bulging, twisted, and bluish veins.  A feeling of heaviness. This may be worse at the end of the day.  Leg pain. This may be worse at the end of the day.  Swelling in the leg.  Changes in skin color over the veins. How is this diagnosed? This condition may be diagnosed based on your symptoms, a physical exam, and an ultrasound test. How is this treated? Treatment for this condition may involve:  Avoiding sitting or standing in one position for long periods of time.  Wearing compression stockings. These stockings help to prevent blood clots and reduce swelling in the legs.  Raising (elevating) the legs when resting.  Losing weight.  Exercising regularly. If you have persistent symptoms or want to improve the way your varicose veins look, you may choose to have a procedure to close the varicose veins off or to remove them. Treatments to close off the veins include:  Sclerotherapy. In this treatment, a solution is injected into a vein to close it off.  Laser treatment. In this treatment, the vein is heated with a laser to close it off.  Radiofrequency vein ablation. In this treatment, an electrical current produced by radio waves is used to close  off the vein. Treatments to remove the veins include:  Phlebectomy. In this treatment, the veins are removed through small incisions made over the veins.  Vein ligation and stripping. In this treatment, incisions are made over the veins. The veins are then removed after being tied (ligated) with stitches (sutures). Follow these instructions at home: Activity  Walk as much as possible. Walking increases blood flow. This helps blood return to the heart and takes pressure off your veins. It also increases your cardiovascular strength.  Follow your health care provider's instructions about exercising.  Do not stand or sit in one position for a long period of time.  Do not sit with your legs crossed.  Rest with your legs raised during the day. General instructions   Follow any diet instructions given to you by your health care provider.  Wear compression stockings as directed by your health care provider. Do not wear other kinds of tight clothing around your legs, pelvis, or waist.  Elevate your legs at night to above the level of your heart.  If you get a cut in the skin over the varicose vein and the vein bleeds: ? Lie down with your leg raised. ? Apply firm pressure to the cut with a clean cloth until the bleeding stops. ? Place a bandage (dressing) on the cut. Contact a health care provider if:  The skin around your varicose veins starts to break down.  You have pain, redness, tenderness, or hard swelling over a vein.  You   are uncomfortable because of pain.  You get a cut in the skin over a varicose vein and it will not stop bleeding. Summary  Varicose veins are veins that have become enlarged, bulged, and twisted. They most often appear in the legs.  This condition is caused by damage to the valves in the vein. These valves help blood return to your heart.  Treatment for this condition includes frequent movements, wearing compression stockings, losing weight, and  exercising regularly. In some cases, procedures are done to close off or remove the veins.  Treatment for this condition may include wearing compression stockings, elevating the legs, losing weight, and engaging in regular activity. In some cases, procedures are done to close off or remove the veins. This information is not intended to replace advice given to you by your health care provider. Make sure you discuss any questions you have with your health care provider. Document Released: 06/29/2005 Document Revised: 11/15/2018 Document Reviewed: 10/12/2016 Elsevier Patient Education  2020 [employer]  

## 2019-09-09 NOTE — Assessment & Plan Note (Signed)
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

## 2019-09-09 NOTE — Telephone Encounter (Signed)
Knot is left leg is on Xarelto. 5-6 places on calf. Pt stated the veins appear blue in color and are "bulging.". Pt stated left leg feel warmer than the right leg.  Denies chest pain or difficulty. Pt denies covid symptoms. Denies fever.  Called [facility] and spoke with Neal. Advised to make appt today. Called pt and informed to to come for appointment Appt made for pt at 1:40 today. Care advice given and pt verbalized understanding.  Reason for Disposition . History of prior "blood clot" in leg or lungs (i.e., deep vein thrombosis, pulmonary embolism)  Answer Assessment - Initial Assessment Questions 1. ONSET: "When did the pain start?"      today 2. LOCATION: "Where is the pain located?"     Denies pain 3. PAIN: "How bad is the pain?"    (Scale 1-10; or mild, moderate, severe)   -  MILD (1-3): doesn't interfere with normal activities    -  MODERATE (4-7): interferes with normal activities (e.g., work or school) or awakens from sleep, limping    -  SEVERE (8-10): excruciating pain, unable to do any normal activities, unable to walk    No pain 4. WORK OR EXERCISE: "Has there been any recent work or exercise that involved this part of the body?"      Just walking 5. CAUSE: "What do you think is causing the leg pain?"    Blood clot 6. OTHER SYMPTOMS: "Do you have any other symptoms?" (e.g., chest pain, back pain, breathing difficulty, swelling, rash, fever, numbness, weakness)     no 7. PREGNANCY: "Is there any chance you are pregnant?" "When was your last menstrual period?"     n/a  Protocols used: LEG PAIN-A-AH

## 2019-09-09 NOTE — Assessment & Plan Note (Signed)
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

## 2019-09-09 NOTE — Progress Notes (Signed)
Patient: Sherry Jones Female    DOB: 1941/07/04   78 y.o.   MRN: [id number] Visit Date: 09/09/2019  Today's Provider: Lavon Paganini, MD   Chief Complaint  Patient presents with  . Leg Pain   Subjective:    I Sherry Jones, CMA, am acting as scribe for Sherry Paganini, MD.   HPI Patient here today C/O veins having "knots" in her left leg. They are worse when she is up and moving around. Patient reports she noticed them on Friday. Patient denies any pain. Patient is taking Xarelto for RLE DVT found in late 07/2019. Patient reports some swelling of bilateral LEs..    Allergies  Allergen Reactions  . Cefadroxil   . Cefuroxime   . Cefuroxime Axetil     Other reaction(s): Unknown Other reaction(s): NAUSEA  . Cephalosporins Other (See Comments)  . Ciprofloxacin Other (See Comments)  . Codeine Swelling  . Hydrocodone-Acetaminophen Other (See Comments)    Other reaction(s): Other (See Comments) Other reaction(s): Unknown Other reaction(s): Other (See Comments)  . Levofloxacin Other (See Comments)  . Meloxicam   . Oxycodone     Other reaction(s): Syncope  . Tramadol     Other reaction(s): Unknown  . Gabapentin Nausea And Vomiting  . Macrolides And Ketolides     Other reaction(s): Other (See Comments) Other Reaction: Intolerance Other reaction(s): Other (See Comments) Other Reaction: Intolerance Other reaction(s): Other (See Comments) Other Reaction: Intolerance Other reaction(s): Other (See Comments) Other Reaction: Intolerance  . Methocarbamol Rash  . Nitrofuran Derivatives Other (See Comments)    Other Reaction: Intolerance  . Nitrofurantoin     Other reaction(s): Other (See Comments) Other Reaction: Intolerance Other reaction(s): Other (See Comments) Other Reaction: Intolerance     Current Outpatient Medications:  .  levothyroxine (SYNTHROID) 75 MCG tablet, TAKE 1 TABLET BY MOUTH EVERY DAY, Disp: 90 tablet, Rfl: 1 .  rivaroxaban (XARELTO) 20 MG  TABS tablet, Take 1 tablet (20 mg total) by mouth daily with supper., Disp: 30 tablet, Rfl: 1 .  simvastatin (ZOCOR) 10 MG tablet, TAKE 1 TABLET BY MOUTH EVERYDAY AT BEDTIME, Disp: 90 tablet, Rfl: 3 .  Vitamin D, Ergocalciferol, (DRISDOL) 1.25 MG (50000 UT) CAPS capsule, TAKE 1 CAPSULE (50,000 UNITS TOTAL) BY MOUTH EVERY 30 (THIRTY) DAYS., Disp: 3 capsule, Rfl: 3 .  CVS CALCIUM 600 MG tablet, Take 1 tablet by mouth 2 (two) times daily with a meal., Disp: , Rfl: 11  Review of Systems  Constitutional: Negative.   Respiratory: Negative.   Cardiovascular: Positive for leg swelling. Negative for chest pain and palpitations.  Skin: Negative.   Neurological: Negative.     Social History   Tobacco Use  . Smoking status: Former Smoker    Years: 36.00    Quit date: 01/02/1996    Years since quitting: 23.7  . Smokeless tobacco: Never Used  Substance Use Topics  . Alcohol use: No      Objective:   BP 131/71 (BP Location: Left Arm, Patient Position: Sitting, Cuff Size: Normal)   Pulse 93   Temp 97.6 F (36.4 C) (Temporal)   Wt 122 lb (55.3 kg)   BMI 24.64 kg/m  Vitals:   09/09/19 1336  BP: 131/71  Pulse: 93  Temp: 97.6 F (36.4 C)  TempSrc: Temporal  Weight: 122 lb (55.3 kg)  Body mass index is 24.64 kg/m.   Physical Exam Vitals signs reviewed.  Constitutional:      General: She is not in acute  distress.    Appearance: She is well-developed.  HENT:     Head: Normocephalic and atraumatic.  Eyes:     General: No scleral icterus.    Conjunctiva/sclera: Conjunctivae normal.  Cardiovascular:     Rate and Rhythm: Normal rate and regular rhythm.  Pulmonary:     Effort: Pulmonary effort is normal. No respiratory distress.  Musculoskeletal:     Right lower leg: No edema.     Left lower leg: No edema.     Comments: Varicose veins of LLE.  No calf tenderness to palpation.  Negative Homans' sign.  Skin:    General: Skin is warm and dry.     Capillary Refill: Capillary refill  takes less than 2 seconds.     Findings: No rash.  Neurological:     Mental Status: She is alert and oriented to person, place, and time. Mental status is at baseline.  Psychiatric:        Mood and Affect: Mood normal.        Behavior: Behavior normal.      No results found for any visits on 09/09/19.     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 do not represent further clot burden Refilled Xarelto today Will follow up with PCP in 11/2019 to check in and see if it is time to discontinue Xarelto Of note, this is not her first DVT, so she may be a candidate for lifelong anticoagulation, but would consider has bled score      Relevant Medications   rivaroxaban (XARELTO) 20 MG TABS tablet   Asymptomatic varicose veins of left lower extremity - Primary    Reassured patient that the prominent veins of her left lower extremity are varicose veins and do not represent additional clot burden Advised compression stockings when up and around during the day and leg elevation when sitting      Relevant Medications   rivaroxaban (XARELTO) 20 MG TABS tablet       Return in about 2 months (around 11/10/2019) for DVT f/u.   The entirety of the information documented in the History of Present Illness, Review of Systems and Physical Exam were personally obtained by me. Portions of this information were initially documented by Sherry Jones, CMA and reviewed by me for thoroughness and accuracy.    Sherry Jones, Sherry Bucy, MD MPH [facility]

## 2019-09-17 ENCOUNTER — Other Ambulatory Visit: Payer: Self-pay | Admitting: Physician Assistant

## 2019-09-17 DIAGNOSIS — E039 Hypothyroidism, unspecified: Secondary | ICD-10-CM

## 2019-09-25 ENCOUNTER — Other Ambulatory Visit: Payer: Self-pay | Admitting: Physician Assistant

## 2019-09-25 MED ORDER — RIVAROXABAN 20 MG PO TABS: 20.0000 mg | ORAL_TABLET | Freq: Every day | ORAL | 1 refills | Status: DC

## 2019-09-25 NOTE — Telephone Encounter (Signed)
rivaroxaban (XARELTO) 20 MG TABS tablet    Patient is requesting refill. She was advised by pharmacy to contact office.    Pharmacy:  [id number] [address], [place] Phone:  [phone]  Fax:  [phone]

## 2019-10-08 DIAGNOSIS — L570 Actinic keratosis: Secondary | ICD-10-CM | POA: Diagnosis not present

## 2019-10-08 DIAGNOSIS — Z96659 Presence of unspecified artificial knee joint: Secondary | ICD-10-CM | POA: Diagnosis not present

## 2019-10-08 DIAGNOSIS — Q6589 Other specified congenital deformities of hip: Secondary | ICD-10-CM | POA: Diagnosis not present

## 2019-11-08 NOTE — Progress Notes (Signed)
Patient: Sherry Jones Female    DOB: 1941/02/14   79 y.o.   MRN: [id number] Visit Date: 11/11/2019  Today's Provider: Mar Daring, PA-C   Chief Complaint  Patient presents with  . Follow-up    DVT   Subjective:     HPI  Acute deep vein thrombosis (DVT) of calf vein of left lower extremity: Patient here for follow-up.Lat office visit she was advised to continue Xarelto for total of at least 3 months.  Reports symptoms are much improved. Leg swelling as resolved. No pain or discoloration. This was a second occurrence of DVT, previous was in right femoral vein many years ago.   Allergies  Allergen Reactions  . Cefadroxil   . Cefuroxime   . Cefuroxime Axetil     Other reaction(s): Unknown Other reaction(s): NAUSEA  . Cephalosporins Other (See Comments)  . Ciprofloxacin Other (See Comments)  . Codeine Swelling  . Hydrocodone-Acetaminophen Other (See Comments)    Other reaction(s): Other (See Comments) Other reaction(s): Unknown Other reaction(s): Other (See Comments)  . Levofloxacin Other (See Comments)  . Meloxicam   . Oxycodone     Other reaction(s): Syncope  . Tramadol     Other reaction(s): Unknown  . Gabapentin Nausea And Vomiting  . Macrolides And Ketolides     Other reaction(s): Other (See Comments) Other Reaction: Intolerance Other reaction(s): Other (See Comments) Other Reaction: Intolerance Other reaction(s): Other (See Comments) Other Reaction: Intolerance Other reaction(s): Other (See Comments) Other Reaction: Intolerance  . Methocarbamol Rash  . Nitrofuran Derivatives Other (See Comments)    Other Reaction: Intolerance  . Nitrofurantoin     Other reaction(s): Other (See Comments) Other Reaction: Intolerance Other reaction(s): Other (See Comments) Other Reaction: Intolerance     Current Outpatient Medications:  .  [facility]S CALCIUM 600 MG tablet, Take 1 tablet by mouth 2 (two) times daily with a meal., Disp: , Rfl: 11 .  levothyroxine  (SYNTHROID) 75 MCG tablet, TAKE 1 TABLET BY MOUTH EVERY DAY, Disp: 90 tablet, Rfl: 1 .  rivaroxaban (XARELTO) 20 MG TABS tablet, Take 1 tablet (20 mg total) by mouth daily with supper., Disp: 30 tablet, Rfl: 1 .  simvastatin (ZOCOR) 10 MG tablet, TAKE 1 TABLET BY MOUTH EVERYDAY AT BEDTIME, Disp: 90 tablet, Rfl: 3 .  Vitamin D, Ergocalciferol, (DRISDOL) 1.25 MG (50000 UT) CAPS capsule, TAKE 1 CAPSULE (50,000 UNITS TOTAL) BY MOUTH EVERY 30 (THIRTY) DAYS., Disp: 3 capsule, Rfl: 3  Review of Systems  Constitutional: Negative.   Respiratory: Negative.   Cardiovascular: Negative.   Neurological: Negative.     Social History   Tobacco Use  . Smoking status: Former Smoker    Years: 36.00    Quit date: 01/02/1996    Years since quitting: 23.8  . Smokeless tobacco: Never Used  Substance Use Topics  . Alcohol use: No      Objective:   BP (!) 95/53 (BP Location: Left Arm, Patient Position: Sitting, Cuff Size: Normal)   Pulse 97   Temp (!) 97 F (36.1 C) (Temporal)   Resp 16   Wt 122 lb 3.2 oz (55.4 kg)   BMI 24.68 kg/m  Vitals:   11/11/19 1455  BP: (!) 95/53  Pulse: 97  Resp: 16  Temp: (!) 97 F (36.1 C)  TempSrc: Temporal  Weight: 122 lb 3.2 oz (55.4 kg)  Body mass index is 24.68 kg/m.   Physical Exam Vitals reviewed.  Constitutional:      General:  She is not in acute distress.    Appearance: Normal appearance. She is well-developed and normal weight. She is not ill-appearing or diaphoretic.  Cardiovascular:     Rate and Rhythm: Normal rate and regular rhythm.     Heart sounds: Normal heart sounds. No murmur. No friction rub. No gallop.   Pulmonary:     Effort: Pulmonary effort is normal. No respiratory distress.     Breath sounds: Normal breath sounds. No wheezing or rales.  Musculoskeletal:     Cervical back: Normal range of motion and neck supple.     Right lower leg: No edema.     Left lower leg: No edema.  Skin:    Findings: No erythema.  Neurological:      Mental Status: She is alert.      No results found for any visits on 11/11/19.     Assessment & Plan    1. Acute deep vein thrombosis (DVT) of calf muscle vein of left lower extremity (HCC) Has bled score is low at 1. Will continue Xarelto 20mg  x 3 more months for a total of 6 months. At that time will decrease Xarelto to 10mg  for DVT prophylaxis/maintenance. She agrees with plan.  - rivaroxaban (XARELTO) 20 MG TABS tablet; Take 1 tablet (20 mg total) by mouth daily with supper.  Dispense: 90 tablet; Refill: 0     Mar Daring, PA-C  [facility]

## 2019-11-11 ENCOUNTER — Ambulatory Visit (INDEPENDENT_AMBULATORY_CARE_PROVIDER_SITE_OTHER): Payer: Medicare Other | Admitting: Physician Assistant

## 2019-11-11 ENCOUNTER — Other Ambulatory Visit: Payer: Self-pay

## 2019-11-11 ENCOUNTER — Encounter: Payer: Self-pay | Admitting: Physician Assistant

## 2019-11-11 VITALS — BP 95/53 | HR 97 | Temp 97.0°F | Resp 16 | Wt 122.2 lb

## 2019-11-11 DIAGNOSIS — I82462 Acute embolism and thrombosis of left calf muscular vein: Secondary | ICD-10-CM | POA: Diagnosis not present

## 2019-11-11 MED ORDER — RIVAROXABAN 20 MG PO TABS: 20.0000 mg | ORAL_TABLET | Freq: Every day | ORAL | 0 refills | Status: DC

## 2019-12-06 ENCOUNTER — Emergency Department: Payer: Medicare Other

## 2019-12-06 ENCOUNTER — Other Ambulatory Visit: Payer: Self-pay

## 2019-12-06 ENCOUNTER — Emergency Department
Admission: EM | Admit: 2019-12-06 | Discharge: 2019-12-06 | Disposition: A | Discharge status: Home / Self Care | Payer: Medicare Other | Attending: Emergency Medicine | Admitting: Emergency Medicine

## 2019-12-06 DIAGNOSIS — Z23 Encounter for immunization: Secondary | ICD-10-CM | POA: Insufficient documentation

## 2019-12-06 DIAGNOSIS — Z85850 Personal history of malignant neoplasm of thyroid: Secondary | ICD-10-CM | POA: Diagnosis not present

## 2019-12-06 DIAGNOSIS — I10 Essential (primary) hypertension: Secondary | ICD-10-CM | POA: Diagnosis not present

## 2019-12-06 DIAGNOSIS — Z79899 Other long term (current) drug therapy: Secondary | ICD-10-CM | POA: Insufficient documentation

## 2019-12-06 DIAGNOSIS — E119 Type 2 diabetes mellitus without complications: Secondary | ICD-10-CM | POA: Insufficient documentation

## 2019-12-06 DIAGNOSIS — Z743 Need for continuous supervision: Secondary | ICD-10-CM | POA: Diagnosis not present

## 2019-12-06 DIAGNOSIS — Z7901 Long term (current) use of anticoagulants: Secondary | ICD-10-CM | POA: Diagnosis not present

## 2019-12-06 DIAGNOSIS — Z87891 Personal history of nicotine dependence: Secondary | ICD-10-CM | POA: Diagnosis not present

## 2019-12-06 DIAGNOSIS — S6991XA Unspecified injury of right wrist, hand and finger(s), initial encounter: Secondary | ICD-10-CM | POA: Diagnosis not present

## 2019-12-06 DIAGNOSIS — Y929 Unspecified place or not applicable: Secondary | ICD-10-CM | POA: Insufficient documentation

## 2019-12-06 DIAGNOSIS — Z8572 Personal history of non-Hodgkin lymphomas: Secondary | ICD-10-CM | POA: Diagnosis not present

## 2019-12-06 DIAGNOSIS — S0993XA Unspecified injury of face, initial encounter: Secondary | ICD-10-CM | POA: Diagnosis present

## 2019-12-06 DIAGNOSIS — R000 Tachycardia, unspecified: Secondary | ICD-10-CM | POA: Diagnosis not present

## 2019-12-06 DIAGNOSIS — S0081XA Abrasion of other part of head, initial encounter: Secondary | ICD-10-CM | POA: Diagnosis not present

## 2019-12-06 DIAGNOSIS — W010XXA Fall on same level from slipping, tripping and stumbling without subsequent striking against object, initial encounter: Secondary | ICD-10-CM | POA: Diagnosis not present

## 2019-12-06 DIAGNOSIS — Y999 Unspecified external cause status: Secondary | ICD-10-CM | POA: Insufficient documentation

## 2019-12-06 DIAGNOSIS — W19XXXA Unspecified fall, initial encounter: Secondary | ICD-10-CM

## 2019-12-06 DIAGNOSIS — S3993XA Unspecified injury of pelvis, initial encounter: Secondary | ICD-10-CM | POA: Diagnosis not present

## 2019-12-06 DIAGNOSIS — E039 Hypothyroidism, unspecified: Secondary | ICD-10-CM | POA: Insufficient documentation

## 2019-12-06 DIAGNOSIS — T148XXA Other injury of unspecified body region, initial encounter: Secondary | ICD-10-CM

## 2019-12-06 DIAGNOSIS — Y9389 Activity, other specified: Secondary | ICD-10-CM | POA: Insufficient documentation

## 2019-12-06 DIAGNOSIS — S0990XA Unspecified injury of head, initial encounter: Secondary | ICD-10-CM | POA: Diagnosis not present

## 2019-12-06 DIAGNOSIS — S0083XA Contusion of other part of head, initial encounter: Secondary | ICD-10-CM | POA: Diagnosis not present

## 2019-12-06 DIAGNOSIS — S022XXA Fracture of nasal bones, initial encounter for closed fracture: Secondary | ICD-10-CM | POA: Diagnosis not present

## 2019-12-06 DIAGNOSIS — S0031XA Abrasion of nose, initial encounter: Secondary | ICD-10-CM | POA: Diagnosis not present

## 2019-12-06 MED ORDER — TETANUS-DIPHTH-ACELL PERTUSSIS 5-2.5-18.5 LF-MCG/0.5 IM SUSP
0.5000 mL | Freq: Once | INTRAMUSCULAR | Status: AC
  Administered 2019-12-06: 0.5 mL via INTRAMUSCULAR
  Filled 2019-12-06: qty 0.5

## 2019-12-06 NOTE — ED Provider Notes (Signed)
[facility] Emergency Department Provider Note   ____________________________________________    I have reviewed the triage vital signs and the nursing notes.   HISTORY  Chief Complaint Fall     HPI Sherry Jones is a 79 y.o. female with a history of diabetes, DVT, on Xarelto presents for fall.  Patient reports he got out of her car and lost her balance and fell onto her face.  She denies LOC.  She complains of mild pain to her nose, mild pain to her right wrist.  She reports she bumped her knees but is able to ambulate on them.  Denies back pain.  No abdominal pain or chest pain.  No nausea or vomiting. No hip pain.  Past Medical History:  Diagnosis Date  . Cancer ([place])    uterine  . Diabetes mellitus without complication ([place])   . Hypercholesterolemia   . Thyroid disease     Patient Active Problem List   Diagnosis Date Noted  . Asymptomatic varicose veins of left lower extremity 09/09/2019  . Acute deep vein thrombosis (DVT) of femoral vein of right lower extremity ([place]) 07/30/2019  . Age-related osteoporosis without current pathological fracture 04/19/2018  . H/O non-Hodgkin's lymphoma 01/23/2017  . History of deep vein thrombosis (DVT) of lower extremity 01/23/2017  . Back pain 08/04/2016  . Diabetes ([place]) 08/04/2016  . Protein-calorie malnutrition, severe 08/04/2016  . Gonalgia 08/25/2015  . Contact dermatitis 08/11/2015  . Atypical nevus 05/29/2015  . Shoulder pain, left 05/29/2015  . Hypercholesteremia 05/26/2015  . 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 H/O ear disorder 06/04/2014  . History of uterine cancer 06/04/2014  . Avitaminosis D 11/20/2009  . Acquired hypothyroidism 11/26/2007  . Barrett esophagus 11/23/2007  . Narrowing of intervertebral disc space 11/23/2007  . Allergic rhinitis 11/22/2007  . Arthritis, degenerative 11/22/2007  . Disease of airway  11/22/2007    Past Surgical History:  Procedure Laterality Date  . ABDOMINAL HYSTERECTOMY    . CATARACT EXTRACTION    . REPLACEMENT TOTAL KNEE     left knee  . ROTATOR CUFF REPAIR Left     Prior to Admission medications   Medication Sig Start Date End Date Taking? Authorizing Provider  [facility]S CALCIUM 600 MG tablet Take 1 tablet by mouth 2 (two) times daily with a meal. 01/15/18   [provider]  levothyroxine (SYNTHROID) 75 MCG tablet TAKE 1 TABLET BY MOUTH EVERY DAY 09/17/19   Mar Daring, PA-C  rivaroxaban (XARELTO) 20 MG TABS tablet Take 1 tablet (20 mg total) by mouth daily with supper. 11/11/19   Mar Daring, PA-C  simvastatin (ZOCOR) 10 MG tablet TAKE 1 TABLET BY MOUTH EVERYDAY AT BEDTIME 02/26/19   Mar Daring, PA-C  Vitamin D, Ergocalciferol, (DRISDOL) 1.25 MG (50000 UT) CAPS capsule TAKE 1 CAPSULE (50,000 UNITS TOTAL) BY MOUTH EVERY 30 (THIRTY) DAYS. 02/27/19   Mar Daring, PA-C     Allergies Cefadroxil, Cefuroxime, Cefuroxime axetil, Cephalosporins, Ciprofloxacin, Codeine, Hydrocodone-acetaminophen, Levofloxacin, Meloxicam, Oxycodone, Tramadol, Gabapentin, Macrolides and ketolides, Methocarbamol, Nitrofuran derivatives, and Nitrofurantoin  Family History  Problem Relation Age of Onset  . Alzheimer's disease Mother   . Diabetes Brother   . Osteoarthritis Brother   . Hypertension Son   . Heart attack Son        stents placed  . Breast cancer Neg Hx     Social History Social History   Tobacco Use  .  Smoking status: Former Smoker    Years: 36.00    Quit date: 01/02/1996    Years since quitting: 23.9  . Smokeless tobacco: Never Used  Substance Use Topics  . Alcohol use: No  . Drug use: No    Review of Systems  Constitutional: No fever/chills Eyes: No visual changes.  ENT: No sore throat. Cardiovascular: Denies chest pain. Respiratory: Denies shortness of breath. Gastrointestinal: No abdominal pain.  No nausea, no  vomiting.   Genitourinary: Negative for dysuria. Musculoskeletal: Negative for back pain. Skin: Negative for rash. Neurological: Negative for headaches or weakness   ____________________________________________   PHYSICAL EXAM:  VITAL SIGNS: ED Triage Vitals  Enc Vitals Group     BP 12/06/19 1312 136/88     Pulse Rate 12/06/19 1312 75     Resp 12/06/19 1312 13     Temp 12/06/19 1312 98.1 F (36.7 C)     Temp Source 12/06/19 1312 Oral     SpO2 12/06/19 1312 97 %     Weight 12/06/19 1313 55.3 kg (122 lb)     Height 12/06/19 1313 1.448 m (4\' 9" )     Head Circumference --      Peak Flow --      Pain Score 12/06/19 1313 10     Pain Loc --      Pain Edu? --      Excl. in [place]? --     Constitutional: Alert and oriented.  Eyes: Conjunctivae are normal.  Head: No hematoma or laceration Nose: Abrasion to the bridge of the nose Mouth/Throat: Mucous membranes are moist.   Neck:  Painless ROM, no vertebral tenderness palpation.  No pain with axial load Cardiovascular: Normal rate, regular rhythm. Grossly normal heart sounds.  Good peripheral circulation.  No chest wall tenderness to palpation Respiratory: Normal respiratory effort.  No retractions. Gastrointestinal: Soft and nontender. No distention.    Musculoskeletal: No pain with axial load on both hips, full range of motion of the lower extremities.  No swelling or effusion of the knees.  Upper extremities with normal range of motion, mild discomfort with range of motion of the right wrist, mild swelling noted. Neurologic:  Normal speech and language. No gross focal neurologic deficits are appreciated.  Skin:  Skin is warm, dry  Psychiatric: Mood and affect are normal. Speech and behavior are normal.  ____________________________________________   LABS (all labs ordered are listed, but only abnormal results are displayed)  Labs Reviewed - No data to  display ____________________________________________  EKG  None ____________________________________________  RADIOLOGY  X-ray wrist X-ray pelvis CT max face and head ____________________________________________   PROCEDURES  Procedure(s) performed: No  Procedures   Critical Care performed: No ____________________________________________   INITIAL IMPRESSION / ASSESSMENT AND PLAN / ED COURSE  Pertinent labs & imaging results that were available during my care of the patient were reviewed by me and considered in my medical decision making (see chart for details).  Patient presents after mechanical fall.  Imaging pending.  Will update tetanus.  She is on Xarelto but denies head injury or nausea vomiting or LOC.  CT pending.    ____________________________________________   FINAL CLINICAL IMPRESSION(S) / ED DIAGNOSES  Final diagnoses:  Fall, initial encounter  Contusion of face, initial encounter  Abrasion        Note:  This document was prepared using Dragon voice recognition software and may include unintentional dictation errors.   Lavonia Drafts, MD 12/06/19 [phone]

## 2019-12-06 NOTE — ED Triage Notes (Signed)
Pt arrives from home following mechanical fall getting out of her car. Pt reports pain in L knee, R wrist, R shoulder, and nose. Pt presents with abrasion to bridge of nose. Pt reports no LOC or headache. Pt reports she is currently taking xarelto.

## 2019-12-11 DIAGNOSIS — S022XXA Fracture of nasal bones, initial encounter for closed fracture: Secondary | ICD-10-CM | POA: Diagnosis not present

## 2020-01-07 ENCOUNTER — Telehealth: Payer: Self-pay

## 2020-01-07 DIAGNOSIS — Q652 Congenital dislocation of hip, unspecified: Secondary | ICD-10-CM

## 2020-01-07 DIAGNOSIS — M25552 Pain in left hip: Secondary | ICD-10-CM

## 2020-01-07 DIAGNOSIS — W19XXXD Unspecified fall, subsequent encounter: Secondary | ICD-10-CM

## 2020-01-07 NOTE — Telephone Encounter (Signed)
Copied from [place] [id number]. Topic: Referral - Request for Referral >> Jan 07, 2020  9:36 AM Erick Blinks wrote: Has patient seen PCP for this complaint? Yes.   *If NO, is insurance requiring patient see PCP for this issue before PCP can refer them? Referral for which specialty: Orthopedics  Preferred provider/office: Highest recommended  Reason for referral: Pt fell a few weeks ago, had to go to the ER. Currently has a lot of pain in her left hip. Can hardly walk.   Best contact: [phone]

## 2020-01-08 NOTE — Addendum Note (Signed)
Addended by: Mar Daring on: 01/08/2020 11:11 AM   Modules accepted: Orders

## 2020-01-08 NOTE — Telephone Encounter (Signed)
Referral placed.

## 2020-01-10 DIAGNOSIS — Z96659 Presence of unspecified artificial knee joint: Secondary | ICD-10-CM | POA: Diagnosis not present

## 2020-01-10 DIAGNOSIS — Q6589 Other specified congenital deformities of hip: Secondary | ICD-10-CM | POA: Diagnosis not present

## 2020-01-15 DIAGNOSIS — M25552 Pain in left hip: Secondary | ICD-10-CM | POA: Diagnosis not present

## 2020-01-15 DIAGNOSIS — M6281 Muscle weakness (generalized): Secondary | ICD-10-CM | POA: Diagnosis not present

## 2020-01-15 DIAGNOSIS — M25562 Pain in left knee: Secondary | ICD-10-CM | POA: Diagnosis not present

## 2020-01-15 DIAGNOSIS — M25662 Stiffness of left knee, not elsewhere classified: Secondary | ICD-10-CM | POA: Diagnosis not present

## 2020-01-23 DIAGNOSIS — R1032 Left lower quadrant pain: Secondary | ICD-10-CM | POA: Diagnosis not present

## 2020-01-23 DIAGNOSIS — M47816 Spondylosis without myelopathy or radiculopathy, lumbar region: Secondary | ICD-10-CM | POA: Diagnosis not present

## 2020-01-23 DIAGNOSIS — M5136 Other intervertebral disc degeneration, lumbar region: Secondary | ICD-10-CM | POA: Diagnosis not present

## 2020-01-23 DIAGNOSIS — Q6589 Other specified congenital deformities of hip: Secondary | ICD-10-CM | POA: Diagnosis not present

## 2020-02-06 DIAGNOSIS — Z8776 Personal history of (corrected) congenital malformations of integument, limbs and musculoskeletal system: Secondary | ICD-10-CM | POA: Diagnosis not present

## 2020-02-06 DIAGNOSIS — M1711 Unilateral primary osteoarthritis, right knee: Secondary | ICD-10-CM | POA: Diagnosis not present

## 2020-02-06 DIAGNOSIS — M1712 Unilateral primary osteoarthritis, left knee: Secondary | ICD-10-CM | POA: Diagnosis not present

## 2020-02-06 DIAGNOSIS — Z471 Aftercare following joint replacement surgery: Secondary | ICD-10-CM | POA: Diagnosis not present

## 2020-02-06 DIAGNOSIS — Z96652 Presence of left artificial knee joint: Secondary | ICD-10-CM | POA: Diagnosis not present

## 2020-02-20 ENCOUNTER — Other Ambulatory Visit: Payer: Self-pay | Admitting: Physician Assistant

## 2020-02-20 DIAGNOSIS — E7800 Pure hypercholesterolemia, unspecified: Secondary | ICD-10-CM

## 2020-03-05 ENCOUNTER — Other Ambulatory Visit: Payer: Self-pay | Admitting: Physician Assistant

## 2020-03-05 DIAGNOSIS — E559 Vitamin D deficiency, unspecified: Secondary | ICD-10-CM

## 2020-03-05 NOTE — Telephone Encounter (Signed)
Requested medication (s) are due for refill today: yes  Requested medication (s) are on the active medication list: yes  Last refill:  12/07/2019  Future visit scheduled: yes  Notes to clinic:  this refill cannot be delegated   Requested Prescriptions  Pending Prescriptions Disp Refills   Vitamin D, Ergocalciferol, (DRISDOL) 1.25 MG (50000 UNIT) CAPS capsule [Pharmacy Med Name: VITAMIN D2 1.25MG (50,000 UNIT)] 3 capsule 3    Sig: TAKE 1 CAPSULE (50,000 UNITS TOTAL) BY MOUTH EVERY 30 (THIRTY) DAYS.      Endocrinology:  Vitamins - Vitamin D Supplementation Failed - 03/05/2020  1:35 AM      Failed - 50,000 IU strengths are not delegated      Failed - Phosphate in normal range and within 360 days    No results found for: PHOS        Failed - Vitamin D in normal range and within 360 days    Vit D, 25-Hydroxy  Date Value Ref Range Status  01/22/2016 35.1 30.0 - 100.0 ng/mL Final    Comment:    Vitamin D deficiency has been defined by the Institute of Medicine and an Endocrine Society practice guideline as a level of serum 25-OH vitamin D less than 20 ng/mL (1,2). The Endocrine Society went on to further define vitamin D insufficiency as a level between 21 and 29 ng/mL (2). 1. IOM (Institute of Medicine). 2010. Dietary reference    intakes for calcium and D. Ray: The    [employer]. 2. Holick MF, Binkley [place], Bischoff-Ferrari HA, et al.    Evaluation, treatment, and prevention of vitamin D    deficiency: an Endocrine Society clinical practice    guideline. JCEM. 2011 Jul; 96(7):1911-30.           Passed - Ca in normal range and within 360 days    Calcium  Date Value Ref Range Status  04/18/2019 8.9 8.7 - 10.3 mg/dL Final   Calcium, Total  Date Value Ref Range Status  10/20/2014 8.3 (L) 8.5 - 10.1 mg/dL Final          Passed - Valid encounter within last 12 months    Recent Outpatient Visits           3 months ago Acute deep vein thrombosis (DVT)  of calf muscle vein of left lower extremity [facility])   [place], [place]   5 months ago Asymptomatic varicose veins of left lower extremity   [facility] [place], Dionne Bucy, MD   7 months ago Acute deep vein thrombosis (DVT) of femoral vein of right lower extremity [facility])   [facility] Brita Romp, Dionne Bucy, MD   8 months ago Fall, initial encounter   [place], PA-C   10 months ago Annual physical exam   [place], [place], [place]

## 2020-03-11 ENCOUNTER — Other Ambulatory Visit: Payer: Self-pay | Admitting: Physician Assistant

## 2020-03-11 DIAGNOSIS — E039 Hypothyroidism, unspecified: Secondary | ICD-10-CM

## 2020-03-11 NOTE — Telephone Encounter (Signed)
Requested Prescriptions  Pending Prescriptions Disp Refills  . levothyroxine (SYNTHROID) 75 MCG tablet [Pharmacy Med Name: LEVOTHYROXINE 75 MCG TABLET] 90 tablet 1    Sig: TAKE 1 TABLET BY MOUTH EVERY DAY     Endocrinology:  Hypothyroid Agents Failed - 03/11/2020  1:26 AM      Failed - TSH needs to be rechecked within 3 months after an abnormal result. Refill until TSH is due.      Passed - TSH in normal range and within 360 days    TSH  Date Value Ref Range Status  04/18/2019 2.000 0.450 - 4.500 uIU/mL Final         Passed - Valid encounter within last 12 months    Recent Outpatient Visits          4 months ago Acute deep vein thrombosis (DVT) of calf muscle vein of left lower extremity [facility])   [place], [place]   6 months ago Asymptomatic varicose veins of left lower extremity   [facility] [place], Dionne Bucy, MD   7 months ago Acute deep vein thrombosis (DVT) of femoral vein of right lower extremity [facility])   [facility] Brita Romp, Dionne Bucy, MD   8 months ago Fall, initial encounter   [place], [place]   10 months ago Annual physical exam   [place], [place]             TSH check due in July 2021.

## 2020-03-24 ENCOUNTER — Other Ambulatory Visit: Payer: Self-pay | Admitting: Physician Assistant

## 2020-03-24 DIAGNOSIS — Z1231 Encounter for screening mammogram for malignant neoplasm of breast: Secondary | ICD-10-CM

## 2020-03-26 ENCOUNTER — Other Ambulatory Visit: Payer: Self-pay | Admitting: Physician Assistant

## 2020-03-26 DIAGNOSIS — I82462 Acute embolism and thrombosis of left calf muscular vein: Secondary | ICD-10-CM

## 2020-03-26 NOTE — Telephone Encounter (Signed)
  Notes to clinic Last visit note states pt will stay on for 3 more months for a total of 6 months.

## 2020-04-09 NOTE — Progress Notes (Signed)
Subjective:   Sherry Jones is a 79 y.o. female who presents for Medicare Annual (Subsequent) preventive examination.  Review of Systems    N/A  Cardiac Risk Factors include: advanced age (>69men, >65 women);dyslipidemia     Objective:    Today's Vitals   04/20/20 1450 04/20/20 1453  BP: 138/70   Pulse: 81   Temp: (!) 97.1 F (36.2 C)   TempSrc: Temporal   SpO2: 97%   Weight: 117 lb 6.4 oz (53.3 kg)   Height: 4\' 11"  (1.499 m)   PainSc: 0-No pain 0-No pain   Body mass index is 23.71 kg/m.  Advanced Directives 04/20/2020 12/06/2019 01/28/2019 07/29/2018 01/22/2018 01/20/2017 08/04/2016  Does Patient Have a Medical Advance Directive? No No No No No No -  Type of Advance Directive - - - - - - Press photographer  Does patient want to make changes to medical advance directive? - - - - - - No - Patient declined  Copy of [place] in Chart? - - - - - - No - copy requested  Would patient like information on creating a medical advance directive? No - Patient declined No - Patient declined No - Patient declined - No - Patient declined No - Patient declined -    Current Medications (verified) Outpatient Encounter Medications as of 04/20/2020  Medication Sig  . [facility] CALCIUM 600 MG tablet Take 1 tablet by mouth 2 (two) times daily with a meal.  . levothyroxine (SYNTHROID) 75 MCG tablet TAKE 1 TABLET BY MOUTH EVERY DAY  . simvastatin (ZOCOR) 10 MG tablet TAKE 1 TABLET BY MOUTH EVERYDAY AT BEDTIME  . Vitamin D, Ergocalciferol, (DRISDOL) 1.25 MG (50000 UNIT) CAPS capsule TAKE 1 CAPSULE (50,000 UNITS TOTAL) BY MOUTH EVERY 30 (THIRTY) DAYS.  Marland Kitchen XARELTO 20 MG TABS tablet TAKE 1 TABLET (20 MG TOTAL) BY MOUTH DAILY WITH SUPPER.   No facility-administered encounter medications on file as of 04/20/2020.    Allergies (verified) Cefadroxil, Cefuroxime, Cefuroxime axetil, Cephalosporins, Ciprofloxacin, Codeine, Hydrocodone-acetaminophen, Levofloxacin, Meloxicam, Oxycodone,  Tramadol, Gabapentin, Macrolides and ketolides, Methocarbamol, Nitrofuran derivatives, and Nitrofurantoin   History: Past Medical History:  Diagnosis Date  . Cancer ([place])    uterine  . Diabetes mellitus without complication ([place])   . Hypercholesterolemia   . Thyroid disease    Past Surgical History:  Procedure Laterality Date  . ABDOMINAL HYSTERECTOMY    . CATARACT EXTRACTION    . REPLACEMENT TOTAL KNEE     left knee  . ROTATOR CUFF REPAIR Left    Family History  Problem Relation Age of Onset  . Alzheimer's disease Mother   . Diabetes Brother   . Osteoarthritis Brother   . Hypertension Son   . Heart attack Son        stents placed  . Breast cancer Neg Hx    Social History   Socioeconomic History  . Marital status: Widowed    Spouse name: Not on file  . Number of children: 3  . Years of education: Not on file  . Highest education level: Some college, no degree  Occupational History  . Occupation: retired  Tobacco Use  . Smoking status: Former Smoker    Years: 36.00    Quit date: 01/02/1996    Years since quitting: 24.3  . Smokeless tobacco: Never Used  Vaping Use  . Vaping Use: Never used  Substance and Sexual Activity  . Alcohol use: No  . Drug use: No  . Sexual  activity: Never  Other Topics Concern  . Not on file  Social History Narrative   Oldest son was killed in 2004 from getting hit by a train.    Social Determinants of Health   Financial Resource Strain: Low Risk   . Difficulty of Paying Living Expenses: Not hard at all  Food Insecurity: No Food Insecurity  . Worried About Charity fundraiser in the Last Year: Never true  . Ran Out of Food in the Last Year: Never true  Transportation Needs: No Transportation Needs  . Lack of Transportation (Medical): No  . Lack of Transportation (Non-Medical): No  Physical Activity: Inactive  . Days of Exercise per Week: 0 days  . Minutes of Exercise per Session: 0 min  Stress: No Stress Concern Present  .  Feeling of Stress : Not at all  Social Connections:   . Frequency of Communication with Friends and Family:   . Frequency of Social Gatherings with Friends and Family:   . Attends Religious Services:   . Active Member of Clubs or Organizations:   . Attends Archivist Meetings:   Marland Kitchen Marital Status:     Tobacco Counseling Counseling given: Not Answered   Clinical Intake:  Pre-visit preparation completed: Yes  Pain : No/denies pain Pain Score: 0-No pain     Nutritional Status: BMI of 19-24  Normal Nutritional Risks: None Diabetes: Yes  How often do you need to have someone help you when you read instructions, pamphlets, or other written materials from your doctor or pharmacy?: 1 - Never  Diabetic? Yes  Nutrition Risk Assessment:  Has the patient had any N/V/D within the last 2 months?  No  Does the patient have any non-healing wounds?  No  Has the patient had any unintentional weight loss or weight gain?  No   Diabetes:  Is the patient diabetic?  Yes  If diabetic, was a CBG obtained today?  No  Did the patient bring in their glucometer from home?  No  How often do you monitor your CBG's? Once a day.   Financial Strains and Diabetes Management:  Are you having any financial strains with the device, your supplies or your medication? No .  Does the patient want to be seen by Chronic Care Management for management of their diabetes?  No  Would the patient like to be referred to a Nutritionist or for Diabetic Management?  No   Diabetic Exams:  Diabetic Eye Exam: Overdue for diabetic eye exam. Pt has been advised about the importance in completing this exam. Patient advised to call and schedule an eye exam. Next eye exam is scheduled for 05/2020 @ [facility]. Diabetic Foot Exam: Overdue, Pt has been advised about the importance in completing this exam. Note made to follow up on this at next in office apt.    Interpreter Needed?: No  Information entered by ::  [facility], LPN   Activities of Daily Living In your present state of health, do you have any difficulty performing the following activities: 04/20/2020  Hearing? Y  Comment Does not wear hearing aids.  Vision? N  Difficulty concentrating or making decisions? N  Walking or climbing stairs? Y  Comment Due to left knee pains.  Dressing or bathing? N  Doing errands, shopping? N  Preparing Food and eating ? N  Using the Toilet? N  In the past six months, have you accidently leaked urine? N  Do you have problems with loss of bowel control? N  Managing your Medications? N  Managing your Finances? N  Housekeeping or managing your Housekeeping? N  Some recent data might be hidden    Patient Care Team: Mar Daring, PA-C as PCP - [facility] (Family Medicine) [place], [place] (Optometry)  Indicate any recent Medical Services you may have received from other than [facility] providers in the past year (date may be approximate).     Assessment:   This is a routine wellness examination for Sherry Jones.  Hearing/Vision screen No exam data present  Dietary issues and exercise activities discussed: Current Exercise Habits: Home exercise routine, Type of exercise: stretching, Time (Minutes): 30, Frequency (Times/Week): 7, Weekly Exercise (Minutes/Week): 210, Intensity: Mild, Exercise limited by: None identified  Goals    . Exercise 3x per week (30 min per time)     Recommend to exercise for 3 days a week for at least 30 minutes at a time.     . Increase water intake     Continue drinking 6-8 glasses of water a day.      Depression Screen PHQ 2/9 Scores 04/20/2020 01/28/2019 01/22/2018 01/20/2017 01/20/2016  PHQ - 2 Score 0 0 0 0 0  PHQ- 9 Score - 0 - - -    Fall Risk Fall Risk  04/20/2020 01/28/2019 01/22/2018 01/20/2017 01/20/2016  Falls in the past year? 0 0 Yes Yes Yes  Number falls in past yr: 0 - 2 or more 1 1  Injury with Fall? 0 - No Yes Yes  Comment - - - will have knee surgery in  the future -  Risk for fall due to : - - Other (Comment) - -  Risk for fall due to: Comment - - prior to total knee replacement in 05/2018 - -  Follow up - - Falls prevention discussed Falls prevention discussed -    Any stairs in or around the home? Yes  If so, are there any without handrails? No  Home free of loose throw rugs in walkways, pet beds, electrical cords, etc? Yes  Adequate lighting in your home to reduce risk of falls? Yes   ASSISTIVE DEVICES UTILIZED TO PREVENT FALLS:  Life alert? No  Use of a cane, walker or w/c? Yes  Grab bars in the bathroom? Yes  Shower chair or bench in shower? Yes  Elevated toilet seat or a handicapped toilet? No   TIMED UP AND GO:  Was the test performed? Yes .  Length of time to ambulate 10 feet: 10 sec.   Gait steady and fast with assistive device  Cognitive Function:     6CIT Screen 01/28/2019 01/20/2017  What Year? 0 points 0 points  What month? 0 points 0 points  What time? 0 points 0 points  Count back from 20 0 points 0 points  Months in reverse 0 points 0 points  Repeat phrase 0 points 8 points  Total Score 0 8    Immunizations Immunization History  Administered Date(s) Administered  . Influenza, High Dose Seasonal PF 09/03/2014, 08/09/2016  . Influenza,inj,Quad PF,6+ Mos 06/24/2013  . Pneumococcal Conjugate-13 05/14/2014  . Pneumococcal Polysaccharide-23 05/27/2011  . Td 10/03/1997  . Tdap 05/27/2011, 11/21/2015, 12/06/2019  . Zoster 02/24/2012    TDAP status: Up to date Flu Vaccine status: Due fall 2021 Pneumococcal vaccine status: Up to date Covid-19 vaccine status: Declined, Education has been provided regarding the importance of this vaccine but patient still declined. Advised may receive this vaccine at local pharmacy or Health Dept.or vaccine clinic. Aware  to provide a copy of the vaccination record if obtained from local pharmacy or Health Dept. Verbalized acceptance and understanding.  Qualifies for Shingles  Vaccine? Yes   Zostavax completed Yes   Shingrix Completed?: No.    Education has been provided regarding the importance of this vaccine. Patient has been advised to call insurance company to determine out of pocket expense if they have not yet received this vaccine. Advised may also receive vaccine at local pharmacy or Health Dept. Verbalized acceptance and understanding.  Screening Tests Health Maintenance  Topic Date Due  . FOOT EXAM  Never done  . URINE MICROALBUMIN  Never done  . OPHTHALMOLOGY EXAM  06/03/2017  . HEMOGLOBIN A1C  10/19/2019  . DEXA SCAN  04/11/2020  . COVID-19 Vaccine (1) 05/06/2020 (Originally 10/21/1952)  . INFLUENZA VACCINE  05/03/2020  . TETANUS/TDAP  12/05/2029  . PNA vac Low Risk Adult  Completed    Health Maintenance  Health Maintenance Due  Topic Date Due  . FOOT EXAM  Never done  . URINE MICROALBUMIN  Never done  . OPHTHALMOLOGY EXAM  06/03/2017  . HEMOGLOBIN A1C  10/19/2019  . DEXA SCAN  04/11/2020    Colorectal cancer screening: No longer required.  Mammogram status: No longer required.  Bone Density status: Completed 04/11/18. Results reflect: Bone density results: OSTEOPOROSIS. Repeat every 2 years.  Lung Cancer Screening: (Low Dose CT Chest recommended if Age 59-80 years, 30 pack-year currently smoking OR have quit w/in 15years.) does not qualify.   Additional Screening:  Vision Screening: Recommended annual ophthalmology exams for early detection of glaucoma and other disorders of the eye. Is the patient up to date with their annual eye exam?  Yes  Who is the provider or what is the name of the office in which the patient attends annual eye exams? [facility] If pt is not established with a provider, would they like to be referred to a provider to establish care? No .   Dental Screening: Recommended annual dental exams for proper oral hygiene  Community Resource Referral / Chronic Care Management: CRR required this visit?  No   CCM required  this visit?  No      Plan:     I have personally reviewed and noted the following in the patient's chart:   . Medical and social history . Use of alcohol, tobacco or illicit drugs  . Current medications and supplements . Functional ability and status . Nutritional status . Physical activity . Advanced directives . List of other physicians . Hospitalizations, surgeries, and ER visits in previous 12 months . Vitals . Screenings to include cognitive, depression, and falls . Referrals and appointments  In addition, I have reviewed and discussed with patient certain preventive protocols, quality metrics, and best practice recommendations. A written personalized care plan for preventive services as well as general preventive health recommendations were provided to patient.     Sapphira Harjo [place], [place]   4/48/1856   Nurse Notes: Pt needs a diabetic foot exam, urine check and Hgb A1c checked at today's in office apt. Pt has an eye exam scheduled for 05/20/20.

## 2020-04-17 ENCOUNTER — Encounter: Payer: Medicare Other | Admitting: Physician Assistant

## 2020-04-20 ENCOUNTER — Ambulatory Visit (INDEPENDENT_AMBULATORY_CARE_PROVIDER_SITE_OTHER): Payer: Medicare Other | Admitting: Physician Assistant

## 2020-04-20 ENCOUNTER — Ambulatory Visit (INDEPENDENT_AMBULATORY_CARE_PROVIDER_SITE_OTHER): Payer: Medicare Other

## 2020-04-20 ENCOUNTER — Encounter: Payer: Self-pay | Admitting: Physician Assistant

## 2020-04-20 ENCOUNTER — Other Ambulatory Visit: Payer: Self-pay

## 2020-04-20 VITALS — BP 138/70 | HR 81 | Temp 97.1°F | Ht 59.0 in | Wt 117.4 lb

## 2020-04-20 DIAGNOSIS — E119 Type 2 diabetes mellitus without complications: Secondary | ICD-10-CM

## 2020-04-20 DIAGNOSIS — E039 Hypothyroidism, unspecified: Secondary | ICD-10-CM

## 2020-04-20 DIAGNOSIS — E7800 Pure hypercholesterolemia, unspecified: Secondary | ICD-10-CM

## 2020-04-20 DIAGNOSIS — Z0000 Encounter for general adult medical examination without abnormal findings: Secondary | ICD-10-CM

## 2020-04-20 DIAGNOSIS — E559 Vitamin D deficiency, unspecified: Secondary | ICD-10-CM

## 2020-04-20 DIAGNOSIS — M810 Age-related osteoporosis without current pathological fracture: Secondary | ICD-10-CM

## 2020-04-20 NOTE — Patient Instructions (Signed)
Sherry Jones , Thank you for taking time to come for your Medicare Wellness Visit. I appreciate your ongoing commitment to your health goals. Please review the following plan we discussed and let me know if I can assist you in the future.   Screening recommendations/referrals: Colonoscopy: No longer required.  Mammogram: Up to date. Scheduled for 04/29/20. Bone Density: Ordered today. Pt aware office will contact her to schedule apt.  Recommended yearly ophthalmology/optometry visit for glaucoma screening and checkup Recommended yearly dental visit for hygiene and checkup  Vaccinations: Influenza vaccine: Due fall 2021 Pneumococcal vaccine: Completed series Tdap vaccine: Up to date, due 12/2029 Shingles vaccine: Currently due.     Advanced directives: Advance directive discussed with you today. Even though you declined this today please call our office should you change your mind and we can give you the proper paperwork for you to fill out.  Conditions/risks identified: Recommend to exercise for 3 days a week for at least 30 minutes at a time.   Next appointment: 3:40 PM with [place] 65 Years and Older, Female Preventive care refers to lifestyle choices and visits with your health care provider that can promote health and wellness. What does preventive care include?  A yearly physical exam. This is also called an annual well check.  Dental exams once or twice a year.  Routine eye exams. Ask your health care provider how often you should have your eyes checked.  Personal lifestyle choices, including:  Daily care of your teeth and gums.  Regular physical activity.  Eating a healthy diet.  Avoiding tobacco and drug use.  Limiting alcohol use.  Practicing safe sex.  Taking low-dose aspirin every day.  Taking vitamin and mineral supplements as recommended by your health care provider. What happens during an annual well check? The services and  screenings done by your health care provider during your annual well check will depend on your age, overall health, lifestyle risk factors, and family history of disease. Counseling  Your health care provider may ask you questions about your:  Alcohol use.  Tobacco use.  Drug use.  Emotional well-being.  Home and relationship well-being.  Sexual activity.  Eating habits.  History of falls.  Memory and ability to understand (cognition).  Work and work Statistician.  Reproductive health. Screening  You may have the following tests or measurements:  Height, weight, and BMI.  Blood pressure.  Lipid and cholesterol levels. These may be checked every 5 years, or more frequently if you are over 27 years old.  Skin check.  Lung cancer screening. You may have this screening every year starting at age 60 if you have a 30-pack-year history of smoking and currently smoke or have quit within the past 15 years.  Fecal occult blood test (FOBT) of the stool. You may have this test every year starting at age 75.  Flexible sigmoidoscopy or colonoscopy. You may have a sigmoidoscopy every 5 years or a colonoscopy every 10 years starting at age 37.  Hepatitis C blood test.  Hepatitis B blood test.  Sexually transmitted disease (STD) testing.  Diabetes screening. This is done by checking your blood sugar (glucose) after you have not eaten for a while (fasting). You may have this done every 1-3 years.  Bone density scan. This is done to screen for osteoporosis. You may have this done starting at age 58.  Mammogram. This may be done every 1-2 years. Talk to your health care provider about  how often you should have regular mammograms. Talk with your health care provider about your test results, treatment options, and if necessary, the need for more tests. Vaccines  Your health care provider may recommend certain vaccines, such as:  Influenza vaccine. This is recommended every  year.  Tetanus, diphtheria, and acellular pertussis (Tdap, Td) vaccine. You may need a Td booster every 10 years.  Zoster vaccine. You may need this after age 34.  Pneumococcal 13-valent conjugate (PCV13) vaccine. One dose is recommended after age 106.  Pneumococcal polysaccharide (PPSV23) vaccine. One dose is recommended after age 33. Talk to your health care provider about which screenings and vaccines you need and how often you need them. This information is not intended to replace advice given to you by your health care provider. Make sure you discuss any questions you have with your health care provider. Document Released: 10/16/2015 Document Revised: 06/08/2016 Document Reviewed: 07/21/2015 Elsevier Interactive Patient Education  2017 [place] Prevention in the Home Falls can cause injuries. They can happen to people of all ages. There are many things you can do to make your home safe and to help prevent falls. What can I do on the outside of my home?  Regularly fix the edges of walkways and driveways and fix any cracks.  Remove anything that might make you trip as you walk through a door, such as a raised step or threshold.  Trim any bushes or trees on the path to your home.  Use bright outdoor lighting.  Clear any walking paths of anything that might make someone trip, such as rocks or tools.  Regularly check to see if handrails are loose or broken. Make sure that both sides of any steps have handrails.  Any raised decks and porches should have guardrails on the edges.  Have any leaves, snow, or ice cleared regularly.  Use sand or salt on walking paths during winter.  Clean up any spills in your garage right away. This includes oil or grease spills. What can I do in the bathroom?  Use night lights.  Install grab bars by the toilet and in the tub and shower. Do not use towel bars as grab bars.  Use non-skid mats or decals in the tub or shower.  If you  need to sit down in the shower, use a plastic, non-slip stool.  Keep the floor dry. Clean up any water that spills on the floor as soon as it happens.  Remove soap buildup in the tub or shower regularly.  Attach bath mats securely with double-sided non-slip rug tape.  Do not have throw rugs and other things on the floor that can make you trip. What can I do in the bedroom?  Use night lights.  Make sure that you have a light by your bed that is easy to reach.  Do not use any sheets or blankets that are too big for your bed. They should not hang down onto the floor.  Have a firm chair that has side arms. You can use this for support while you get dressed.  Do not have throw rugs and other things on the floor that can make you trip. What can I do in the kitchen?  Clean up any spills right away.  Avoid walking on wet floors.  Keep items that you use a lot in easy-to-reach places.  If you need to reach something above you, use a strong step stool that has a grab bar.  Keep  electrical cords out of the way.  Do not use floor polish or wax that makes floors slippery. If you must use wax, use non-skid floor wax.  Do not have throw rugs and other things on the floor that can make you trip. What can I do with my stairs?  Do not leave any items on the stairs.  Make sure that there are handrails on both sides of the stairs and use them. Fix handrails that are broken or loose. Make sure that handrails are as long as the stairways.  Check any carpeting to make sure that it is firmly attached to the stairs. Fix any carpet that is loose or worn.  Avoid having throw rugs at the top or bottom of the stairs. If you do have throw rugs, attach them to the floor with carpet tape.  Make sure that you have a light switch at the top of the stairs and the bottom of the stairs. If you do not have them, ask someone to add them for you. What else can I do to help prevent falls?  Wear shoes  that:  Do not have high heels.  Have rubber bottoms.  Are comfortable and fit you well.  Are closed at the toe. Do not wear sandals.  If you use a stepladder:  Make sure that it is fully opened. Do not climb a closed stepladder.  Make sure that both sides of the stepladder are locked into place.  Ask someone to hold it for you, if possible.  Clearly mark and make sure that you can see:  Any grab bars or handrails.  First and last steps.  Where the edge of each step is.  Use tools that help you move around (mobility aids) if they are needed. These include:  Canes.  Walkers.  Scooters.  Crutches.  Turn on the lights when you go into a dark area. Replace any light bulbs as soon as they burn out.  Set up your furniture so you have a clear path. Avoid moving your furniture around.  If any of your floors are uneven, fix them.  If there are any pets around you, be aware of where they are.  Review your medicines with your doctor. Some medicines can make you feel dizzy. This can increase your chance of falling. Ask your doctor what other things that you can do to help prevent falls. This information is not intended to replace advice given to you by your health care provider. Make sure you discuss any questions you have with your health care provider. Document Released: 07/16/2009 Document Revised: 02/25/2016 Document Reviewed: 10/24/2014 Elsevier Interactive Patient Education  2017 [employer].

## 2020-04-20 NOTE — Patient Instructions (Signed)
Health Maintenance After Age 78 After age 78, you are at a higher risk for certain long-term diseases and infections as well as injuries from falls. Falls are a major cause of broken bones and head injuries in people who are older than age 78. Getting regular preventive care can help to keep you healthy and well. Preventive care includes getting regular testing and making lifestyle changes as recommended by your health care provider. Talk with your health care provider about:  Which screenings and tests you should have. A screening is a test that checks for a disease when you have no symptoms.  A diet and exercise plan that is right for you. What should I know about screenings and tests to prevent falls? Screening and testing are the best ways to find a health problem early. Early diagnosis and treatment give you the best chance of managing medical conditions that are common after age 78. Certain conditions and lifestyle choices may make you more likely to have a fall. Your health care provider may recommend:  Regular vision checks. Poor vision and conditions such as cataracts can make you more likely to have a fall. If you wear glasses, make sure to get your prescription updated if your vision changes.  Medicine review. Work with your health care provider to regularly review all of the medicines you are taking, including over-the-counter medicines. Ask your health care provider about any side effects that may make you more likely to have a fall. Tell your health care provider if any medicines that you take make you feel dizzy or sleepy.  Osteoporosis screening. Osteoporosis is a condition that causes the bones to get weaker. This can make the bones weak and cause them to break more easily.  Blood pressure screening. Blood pressure changes and medicines to control blood pressure can make you feel dizzy.  Strength and balance checks. Your health care provider may recommend certain tests to check your  strength and balance while standing, walking, or changing positions.  Foot health exam. Foot pain and numbness, as well as not wearing proper footwear, can make you more likely to have a fall.  Depression screening. You may be more likely to have a fall if you have a fear of falling, feel emotionally low, or feel unable to do activities that you used to do.  Alcohol use screening. Using too much alcohol can affect your balance and may make you more likely to have a fall. What actions can I take to lower my risk of falls? General instructions  Talk with your health care provider about your risks for falling. Tell your health care provider if: ? You fall. Be sure to tell your health care provider about all falls, even ones that seem minor. ? You feel dizzy, sleepy, or off-balance.  Take over-the-counter and prescription medicines only as told by your health care provider. These include any supplements.  Eat a healthy diet and maintain a healthy weight. A healthy diet includes low-fat dairy products, low-fat (lean) meats, and fiber from whole grains, beans, and lots of fruits and vegetables. Home safety  Remove any tripping hazards, such as rugs, cords, and clutter.  Install safety equipment such as grab bars in bathrooms and safety rails on stairs.  Keep rooms and walkways well-lit. Activity   Follow a regular exercise program to stay fit. This will help you maintain your balance. Ask your health care provider what types of exercise are appropriate for you.  If you need a cane or   walker, use it as recommended by your health care provider.  Wear supportive shoes that have nonskid soles. Lifestyle  Do not drink alcohol if your health care provider tells you not to drink.  If you drink alcohol, limit how much you have: ? 0-1 drink a day for women. ? 0-2 drinks a day for men.  Be aware of how much alcohol is in your drink. In the [place] one drink equals one typical bottle of beer (12  oz), one-half glass of wine (5 oz), or one shot of hard liquor (1 oz).  Do not use any products that contain nicotine or tobacco, such as cigarettes and e-cigarettes. If you need help quitting, ask your health care provider. Summary  Having a healthy lifestyle and getting preventive care can help to protect your health and wellness after age 78.  Screening and testing are the best way to find a health problem early and help you avoid having a fall. Early diagnosis and treatment give you the best chance for managing medical conditions that are more common for people who are older than age 78.  Falls are a major cause of broken bones and head injuries in people who are older than age 78. Take precautions to prevent a fall at home.  Work with your health care provider to learn what changes you can make to improve your health and wellness and to prevent falls. This information is not intended to replace advice given to you by your health care provider. Make sure you discuss any questions you have with your health care provider. Document Revised: 01/10/2019 Document Reviewed: 08/02/2017 Elsevier Patient Education  2020 Elsevier Inc.  

## 2020-04-20 NOTE — Progress Notes (Signed)
Complete physical exam   Patient: Sherry Jones   DOB: 02-Aug-1941   79 y.o. Female  MRN: [id number] Visit Date: 04/20/2020  Today's healthcare provider: Mar Daring, PA-C   Chief Complaint  Patient presents with  . Annual Exam   Subjective    Sherry Jones is a 79 y.o. female who presents today for a complete physical exam.  She reports consuming a general diet. The patient does not participate in regular exercise at present.She stays very active.  She generally feels well. She reports sleeping well. She does not have additional problems to discuss today.  HPI  Patient had her AWV done today with [facility].  Past Medical History:  Diagnosis Date  . Cancer ([place])    uterine  . Diabetes mellitus without complication ([place])   . Hypercholesterolemia   . Thyroid disease    Past Surgical History:  Procedure Laterality Date  . ABDOMINAL HYSTERECTOMY    . CATARACT EXTRACTION    . REPLACEMENT TOTAL KNEE     left knee  . ROTATOR CUFF REPAIR Left    Social History   Socioeconomic History  . Marital status: Widowed    Spouse name: Not on file  . Number of children: 3  . Years of education: Not on file  . Highest education level: Some college, no degree  Occupational History  . Occupation: retired  Tobacco Use  . Smoking status: Former Smoker    Years: 36.00    Quit date: 01/02/1996    Years since quitting: 24.3  . Smokeless tobacco: Never Used  Vaping Use  . Vaping Use: Never used  Substance and Sexual Activity  . Alcohol use: No  . Drug use: No  . Sexual activity: Never  Other Topics Concern  . Not on file  Social History Narrative   Oldest son was killed in 2004 from getting hit by a train.    Social Determinants of Health   Financial Resource Strain: Low Risk   . Difficulty of Paying Living Expenses: Not hard at all  Food Insecurity: No Food Insecurity  . Worried About Charity fundraiser in the Last Year: Never true  . Ran Out of Food in the Last Year:  Never true  Transportation Needs: No Transportation Needs  . Lack of Transportation (Medical): No  . Lack of Transportation (Non-Medical): No  Physical Activity: Inactive  . Days of Exercise per Week: 0 days  . Minutes of Exercise per Session: 0 min  Stress: No Stress Concern Present  . Feeling of Stress : Not at all  Social Connections:   . Frequency of Communication with Friends and Family:   . Frequency of Social Gatherings with Friends and Family:   . Attends Religious Services:   . Active Member of Clubs or Organizations:   . Attends Archivist Meetings:   Marland Kitchen Marital Status:   Intimate Partner Violence:   . Fear of Current or Ex-Partner:   . Emotionally Abused:   Marland Kitchen Physically Abused:   . Sexually Abused:    Family Status  Relation Name Status  . Mother  Deceased at age 6       Dementia  . Father  Deceased at age 72       pneumonia  . Sister  Deceased       trauma  . Sister  Deceased at age 12 month old       pneumonia  . Brother  (Not Specified)  .  Brother  (Not Specified)  . Daughter  Alive  . Son  Deceased       killed after being hit by a train  . Son  Alive  . Neg Hx  (Not Specified)   Family History  Problem Relation Age of Onset  . Alzheimer's disease Mother   . Diabetes Brother   . Osteoarthritis Brother   . Hypertension Son   . Heart attack Son        stents placed  . Breast cancer Neg Hx    Allergies  Allergen Reactions  . Cefadroxil   . Cefuroxime   . Cefuroxime Axetil     Other reaction(s): Unknown Other reaction(s): NAUSEA  . Cephalosporins Other (See Comments)  . Ciprofloxacin Other (See Comments)  . Codeine Swelling  . Hydrocodone-Acetaminophen Other (See Comments)    Other reaction(s): Other (See Comments) Other reaction(s): Unknown Other reaction(s): Other (See Comments)  . Levofloxacin Other (See Comments)  . Meloxicam   . Oxycodone     Other reaction(s): Syncope  . Tramadol     Other reaction(s): Unknown  .  Gabapentin Nausea And Vomiting  . Macrolides And Ketolides     Other reaction(s): Other (See Comments) Other Reaction: Intolerance Other reaction(s): Other (See Comments) Other Reaction: Intolerance Other reaction(s): Other (See Comments) Other Reaction: Intolerance Other reaction(s): Other (See Comments) Other Reaction: Intolerance  . Methocarbamol Rash  . Nitrofuran Derivatives Other (See Comments)    Other Reaction: Intolerance  . Nitrofurantoin     Other reaction(s): Other (See Comments) Other Reaction: Intolerance Other reaction(s): Other (See Comments) Other Reaction: Intolerance    Patient Care Team: Mar Daring, PA-C as PCP - General (Family Medicine) Pa, [place] ([facility])   Medications: Outpatient Medications Prior to Visit  Medication Sig  . [facility] CALCIUM 600 MG tablet Take 1 tablet by mouth 2 (two) times daily with a meal.  . levothyroxine (SYNTHROID) 75 MCG tablet TAKE 1 TABLET BY MOUTH EVERY DAY  . simvastatin (ZOCOR) 10 MG tablet TAKE 1 TABLET BY MOUTH EVERYDAY AT BEDTIME  . Vitamin D, Ergocalciferol, (DRISDOL) 1.25 MG (50000 UNIT) CAPS capsule TAKE 1 CAPSULE (50,000 UNITS TOTAL) BY MOUTH EVERY 30 (THIRTY) DAYS.  Marland Kitchen XARELTO 20 MG TABS tablet TAKE 1 TABLET (20 MG TOTAL) BY MOUTH DAILY WITH SUPPER.   No facility-administered medications prior to visit.    Review of Systems  Constitutional: Negative.   HENT: Negative.   Eyes: Negative.   Respiratory: Negative.   Cardiovascular: Negative.   Gastrointestinal: Negative.   Endocrine: Negative.   Genitourinary: Negative.   Musculoskeletal: Negative.   Skin: Negative.   Allergic/Immunologic: Negative.   Neurological: Negative.   Hematological: Negative.   Psychiatric/Behavioral: Negative.     {Heme  Chem  Endocrine  Serology  Results Review (optional):[id number]::" "}  Objective     BP 138/70 (BP Location: Right Arm)  Pulse 81  Temp 97.1 F (36.2 C)Important (Temporal)  Ht 4\' 11"   (1.499 m)  Wt 117 lb 6.4 oz (53.3 kg)  SpO2 97%  BMI 23.71 kg/m  BSA 1.49 m    Physical Exam Vitals reviewed.  Constitutional:      General: She is not in acute distress.    Appearance: Normal appearance. She is well-developed. She is not ill-appearing or diaphoretic.  HENT:     Head: Normocephalic and atraumatic.     Right Ear: Tympanic membrane, ear canal and external ear normal.     Left Ear: Tympanic membrane, ear canal  and external ear normal.     Nose: Nose normal.     Mouth/Throat:     Mouth: Mucous membranes are moist.     Pharynx: Oropharynx is clear. No oropharyngeal exudate.  Eyes:     General: No scleral icterus.       Right eye: No discharge.        Left eye: No discharge.     Extraocular Movements: Extraocular movements intact.     Conjunctiva/sclera: Conjunctivae normal.     Pupils: Pupils are equal, round, and reactive to light.  Neck:     Thyroid: No thyromegaly.     Vascular: No carotid bruit or JVD.     Trachea: No tracheal deviation.  Cardiovascular:     Rate and Rhythm: Normal rate and regular rhythm.     Pulses: Normal pulses.     Heart sounds: Normal heart sounds. No murmur heard.  No friction rub. No gallop.   Pulmonary:     Effort: Pulmonary effort is normal. No respiratory distress.     Breath sounds: Normal breath sounds. No wheezing or rales.  Chest:     Chest wall: No tenderness.  Abdominal:     General: Abdomen is flat. Bowel sounds are normal. There is no distension.     Palpations: Abdomen is soft. There is no mass.     Tenderness: There is no abdominal tenderness. There is no guarding or rebound.  Musculoskeletal:        General: No tenderness. Normal range of motion.     Cervical back: Normal range of motion and neck supple.     Right lower leg: No edema.     Left lower leg: No edema.  Lymphadenopathy:     Cervical: No cervical adenopathy.  Skin:    General: Skin is warm and dry.     Capillary Refill: Capillary refill takes  less than 2 seconds.     Findings: No rash.  Neurological:     General: No focal deficit present.     Mental Status: She is alert and oriented to person, place, and time. Mental status is at baseline.  Psychiatric:        Mood and Affect: Mood normal.        Behavior: Behavior normal.        Thought Content: Thought content normal.        Judgment: Judgment normal.      Last depression screening scores PHQ 2/9 Scores 04/20/2020 01/28/2019 01/22/2018  PHQ - 2 Score 0 0 0  PHQ- 9 Score - 0 -   Last fall risk screening Fall Risk  04/20/2020  Falls in the past year? 0  Number falls in past yr: 0  Injury with Fall? 0  Comment -  Risk for fall due to : -  Risk for fall due to: Comment -  Follow up -   Last Audit-C alcohol use screening Alcohol Use Disorder Test (AUDIT) 04/20/2020  1. How often do you have a drink containing alcohol? 0  2. How many drinks containing alcohol do you have on a typical day when you are drinking? 0  3. How often do you have six or more drinks on one occasion? 0  AUDIT-C Score 0  Alcohol Brief Interventions/Follow-up AUDIT Score <7 follow-up not indicated   A score of 3 or more in women, and 4 or more in men indicates increased risk for alcohol abuse, EXCEPT if all of the points are from question 1  No results found for any visits on 04/20/20.  Assessment & Plan    Routine Health Maintenance and Physical Exam  Exercise Activities and Dietary recommendations Goals    . Exercise 3x per week (30 min per time)     Recommend to exercise for 3 days a week for at least 30 minutes at a time.     . Increase water intake     Continue drinking 6-8 glasses of water a day.       Immunization History  Administered Date(s) Administered  . Influenza, High Dose Seasonal PF 09/03/2014, 08/09/2016  . Influenza,inj,Quad PF,6+ Mos 06/24/2013  . Pneumococcal Conjugate-13 05/14/2014  . Pneumococcal Polysaccharide-23 05/27/2011  . Td 10/03/1997  . Tdap  05/27/2011, 11/21/2015, 12/06/2019  . Zoster 02/24/2012    Health Maintenance  Topic Date Due  . FOOT EXAM  Never done  . URINE MICROALBUMIN  Never done  . OPHTHALMOLOGY EXAM  06/03/2017  . HEMOGLOBIN A1C  10/19/2019  . DEXA SCAN  04/11/2020  . COVID-19 Vaccine (1) 05/06/2020 (Originally 10/21/1952)  . INFLUENZA VACCINE  05/03/2020  . TETANUS/TDAP  12/05/2029  . PNA vac Low Risk Adult  Completed    Discussed health benefits of physical activity, and encouraged her to engage in regular exercise appropriate for her age and condition.  1. Annual physical exam Normal physical exam today. Will check labs as below and f/u pending lab results. If labs are stable and WNL she will not need to have these rechecked for one year at her next annual physical exam. She is to call the office in the meantime if she has any acute issue, questions or concerns.  2. Type 2 diabetes mellitus without complication, without long-term current use of insulin (HCC) Stable. Diet controlled. Will check labs as below and f/u pending results. - CBC with Differential/Platelet - Comprehensive metabolic panel - Hemoglobin A1c - Lipid panel - Urine Microalbumin w/creat. ratio  3. Acquired hypothyroidism Stable. Continue Levothyroxine 4mcg. Will check labs as below and f/u pending results. - CBC with Differential/Platelet - Comprehensive metabolic panel - TSH  4. Hypercholesteremia Stable. Continue Simvastatin 10mg . Will check labs as below and f/u pending results. - CBC with Differential/Platelet - Comprehensive metabolic panel - Hemoglobin A1c - Lipid panel  5. Avitaminosis D H/O this. Post menopausal. Continue Vit D once monthly dosing.  - CBC with Differential/Platelet - Vitamin D (25 hydroxy)   No follow-ups on file.     Reynolds Bowl, PA-C, have reviewed all documentation for this visit. The documentation on 04/22/20 for the exam, diagnosis, procedures, and orders are all accurate and  complete.   [name]  [facility] [phone] (phone) [phone] (fax)  [place]

## 2020-04-27 DIAGNOSIS — E7800 Pure hypercholesterolemia, unspecified: Secondary | ICD-10-CM | POA: Diagnosis not present

## 2020-04-27 DIAGNOSIS — E119 Type 2 diabetes mellitus without complications: Secondary | ICD-10-CM | POA: Diagnosis not present

## 2020-04-27 DIAGNOSIS — E039 Hypothyroidism, unspecified: Secondary | ICD-10-CM | POA: Diagnosis not present

## 2020-04-27 DIAGNOSIS — E559 Vitamin D deficiency, unspecified: Secondary | ICD-10-CM | POA: Diagnosis not present

## 2020-04-28 ENCOUNTER — Telehealth: Payer: Self-pay

## 2020-04-28 LAB — VITAMIN D 25 HYDROXY (VIT D DEFICIENCY, FRACTURES): Vit D, 25-Hydroxy: 38.1 ng/mL (ref 30.0–100.0)

## 2020-04-28 LAB — COMPREHENSIVE METABOLIC PANEL
ALT: 14 IU/L (ref 0–32)
AST: 22 IU/L (ref 0–40)
Albumin/Globulin Ratio: 1.5 (ref 1.2–2.2)
Albumin: 4.3 g/dL (ref 3.7–4.7)
Alkaline Phosphatase: 59 IU/L (ref 48–121)
BUN/Creatinine Ratio: 12 (ref 12–28)
BUN: 9 mg/dL (ref 8–27)
Bilirubin Total: 0.6 mg/dL (ref 0.0–1.2)
CO2: 23 mmol/L (ref 20–29)
Calcium: 9 mg/dL (ref 8.7–10.3)
Chloride: 101 mmol/L (ref 96–106)
Creatinine, Ser: 0.75 mg/dL (ref 0.57–1.00)
GFR calc Af Amer: 88 mL/min/{1.73_m2} (ref >59)
GFR calc non Af Amer: 77 mL/min/{1.73_m2} (ref >59)
Globulin, Total: 2.8 g/dL (ref 1.5–4.5)
Glucose: 108 mg/dL — ABNORMAL HIGH (ref 65–99)
Potassium: 4 mmol/L (ref 3.5–5.2)
Sodium: 140 mmol/L (ref 134–144)
Total Protein: 7.1 g/dL (ref 6.0–8.5)

## 2020-04-28 LAB — CBC WITH DIFFERENTIAL/PLATELET
Basophils Absolute: 0.1 10*3/uL (ref 0.0–0.2)
Basos: 1 %
EOS (ABSOLUTE): 0.1 10*3/uL (ref 0.0–0.4)
Eos: 3 %
Hematocrit: 35.4 % (ref 34.0–46.6)
Hemoglobin: 12 g/dL (ref 11.1–15.9)
Immature Grans (Abs): 0 10*3/uL (ref 0.0–0.1)
Immature Granulocytes: 0 %
Lymphocytes Absolute: 1.2 10*3/uL (ref 0.7–3.1)
Lymphs: 26 %
MCH: 29.7 pg (ref 26.6–33.0)
MCHC: 33.9 g/dL (ref 31.5–35.7)
MCV: 88 fL (ref 79–97)
Monocytes Absolute: 0.5 10*3/uL (ref 0.1–0.9)
Monocytes: 10 %
Neutrophils Absolute: 2.7 10*3/uL (ref 1.4–7.0)
Neutrophils: 60 %
Platelets: 188 10*3/uL (ref 150–450)
RBC: 4.04 x10E6/uL (ref 3.77–5.28)
RDW: 15.6 % — ABNORMAL HIGH (ref 11.7–15.4)
WBC: 4.6 10*3/uL (ref 3.4–10.8)

## 2020-04-28 LAB — LIPID PANEL
Chol/HDL Ratio: 2.6 ratio (ref 0.0–4.4)
Cholesterol, Total: 181 mg/dL (ref 100–199)
HDL: 70 mg/dL (ref >39)
LDL Chol Calc (NIH): 93 mg/dL (ref 0–99)
Triglycerides: 99 mg/dL (ref 0–149)
VLDL Cholesterol Cal: 18 mg/dL (ref 5–40)

## 2020-04-28 LAB — TSH: TSH: 1.37 u[IU]/mL (ref 0.450–4.500)

## 2020-04-28 LAB — HEMOGLOBIN A1C
Est. average glucose Bld gHb Est-mCnc: 126 mg/dL
Hgb A1c MFr Bld: 6 % — ABNORMAL HIGH (ref 4.8–5.6)

## 2020-04-28 LAB — MICROALBUMIN / CREATININE URINE RATIO
Creatinine, Urine: 75 mg/dL
Microalb/Creat Ratio: 5 mg/g creat (ref 0–29)
Microalbumin, Urine: 3.4 ug/mL

## 2020-04-28 NOTE — Telephone Encounter (Signed)
Patient returned call and was read lab note by Fenton Malling PA written today.  She verbalized understanding of all information.  She has no questions.

## 2020-04-28 NOTE — Telephone Encounter (Signed)
-----   Message from Mar Daring, [place] sent at 04/28/2020 11:44 AM EDT ----- Urine microalbumin is normal. Blood count is normal. Kidney and liver function are normal. Sodium, potassium, and calcium are normal. A1c/sugar increased just slightly from 5.8 to 6.0. Cholesterol is normal. Thyroid is normal. Vit D is normal.

## 2020-04-28 NOTE — Telephone Encounter (Signed)
LMTCB 04/28/2020.   PEC please advise pt of lab results below.   Thanks,   -Mickel Baas

## 2020-05-07 ENCOUNTER — Other Ambulatory Visit: Payer: Medicare Other

## 2020-05-12 ENCOUNTER — Ambulatory Visit
Admission: RE | Admit: 2020-05-12 | Discharge: 2020-05-12 | Disposition: A | Discharge status: Home / Self Care | Payer: Medicare Other | Source: Ambulatory Visit | Attending: Physician Assistant | Admitting: Physician Assistant

## 2020-05-12 ENCOUNTER — Other Ambulatory Visit: Payer: Self-pay

## 2020-05-12 DIAGNOSIS — R29890 Loss of height: Secondary | ICD-10-CM | POA: Diagnosis not present

## 2020-05-12 DIAGNOSIS — Z8572 Personal history of non-Hodgkin lymphomas: Secondary | ICD-10-CM | POA: Diagnosis not present

## 2020-05-12 DIAGNOSIS — M810 Age-related osteoporosis without current pathological fracture: Secondary | ICD-10-CM

## 2020-05-12 DIAGNOSIS — Z1231 Encounter for screening mammogram for malignant neoplasm of breast: Secondary | ICD-10-CM | POA: Insufficient documentation

## 2020-05-12 DIAGNOSIS — Z780 Asymptomatic menopausal state: Secondary | ICD-10-CM | POA: Diagnosis not present

## 2020-05-14 ENCOUNTER — Telehealth: Payer: Self-pay

## 2020-05-14 NOTE — Telephone Encounter (Signed)
-----   Message from Mar Daring, [place] sent at 05/13/2020 10:17 AM EDT ----- Bone density does show continued osteoporosis but the T score went from -2.8 in 2019 to now -2.6, which is a slight improvement. Continue your current supplements and staying active as you do.

## 2020-05-14 NOTE — Telephone Encounter (Signed)
Pt. Given results, verbalizes understanding. 

## 2020-05-14 NOTE — Telephone Encounter (Signed)
-----   Message from Mar Daring, PA-C sent at 05/13/2020 10:17 AM EDT ----- Normal mammogram. Repeat screening in one year.

## 2020-05-14 NOTE — Telephone Encounter (Signed)
Attempted to contact patient, no answer left a voicemail. Okay for PEC triage to advise patient. ° °

## 2020-05-15 ENCOUNTER — Other Ambulatory Visit: Payer: Self-pay | Admitting: Physician Assistant

## 2020-05-15 DIAGNOSIS — E7800 Pure hypercholesterolemia, unspecified: Secondary | ICD-10-CM

## 2020-05-20 DIAGNOSIS — H02831 Dermatochalasis of right upper eyelid: Secondary | ICD-10-CM | POA: Diagnosis not present

## 2020-05-21 ENCOUNTER — Telehealth: Payer: Self-pay

## 2020-05-21 NOTE — Telephone Encounter (Signed)
Called and spoke with patient there was no message in chart or pending medication or appointment. Advised patient that her prescription was filled 05/15/20 for Simvastatin. KW

## 2020-05-21 NOTE — Telephone Encounter (Signed)
Copied from [place] [phone]. Topic: General - Call Back - No Documentation >> May 21, 2020  1:00 PM Yvette Rack wrote: Reason for CRM: Pt stated she had a message asking her to call the office. Pt requests call back.

## 2020-05-22 ENCOUNTER — Other Ambulatory Visit: Payer: Self-pay | Admitting: Physician Assistant

## 2020-05-22 DIAGNOSIS — E039 Hypothyroidism, unspecified: Secondary | ICD-10-CM

## 2020-05-22 NOTE — Telephone Encounter (Signed)
Requested Prescriptions  Pending Prescriptions Disp Refills   levothyroxine (SYNTHROID) 75 MCG tablet [Pharmacy Med Name: LEVOTHYROXINE 75 MCG TABLET] 90 tablet 3    Sig: TAKE 1 TABLET BY MOUTH EVERY DAY     Endocrinology:  Hypothyroid Agents Failed - 05/22/2020  5:01 PM      Failed - TSH needs to be rechecked within 3 months after an abnormal result. Refill until TSH is due.      Passed - TSH in normal range and within 360 days    TSH  Date Value Ref Range Status  04/27/2020 1.370 0.450 - 4.500 uIU/mL Final         Passed - Valid encounter within last 12 months    Recent Outpatient Visits          1 month ago Annual physical exam   [facility] Fenton Malling M, PA-C   6 months ago Acute deep vein thrombosis (DVT) of calf muscle vein of left lower extremity [facility])   [place], [place]   8 months ago Asymptomatic varicose veins of left lower extremity   [facility] [place], Dionne Bucy, MD   9 months ago Acute deep vein thrombosis (DVT) of femoral vein of right lower extremity [facility])   [facility] Brita Romp, Dionne Bucy, MD   11 months ago Fall, initial encounter   [place], PA-C      Future Appointments            In 65 months Marlyn Corporal, Clearnce Sorrel, PA-C [employer], [place]

## 2020-06-25 ENCOUNTER — Other Ambulatory Visit: Payer: Self-pay | Admitting: Physician Assistant

## 2020-06-25 DIAGNOSIS — I82462 Acute embolism and thrombosis of left calf muscular vein: Secondary | ICD-10-CM

## 2020-06-25 NOTE — Telephone Encounter (Signed)
   Notes to clinic This med was originally noted to be for 6 months. Unsure if still is supposed to be on this med, please assess.

## 2020-07-10 ENCOUNTER — Telehealth: Payer: Self-pay | Admitting: Physician Assistant

## 2020-07-10 DIAGNOSIS — I82411 Acute embolism and thrombosis of right femoral vein: Secondary | ICD-10-CM

## 2020-07-10 NOTE — Telephone Encounter (Signed)
Copied from [place] [phone]. Topic: General - Other >> Jul 10, 2020 10:55 AM Celene Kras wrote: Reason for CRM: Pt called stating that she went to Xarelto. She states that the pharamcy is charging her $300 for it and that she cannot afford it. Pt is requesting to have something else sent in for her. Please advise.    [id number] [address] [place] [address] Phone: [phone] Fax: [phone] Hours: Not open 24 hours

## 2020-07-10 NOTE — Telephone Encounter (Signed)
Can we see if we have some samples of xarelto 20mg  to give to her for a month.  I am also going to refer her to [place], [facility] to see if she qualifies for pharmaceutical assistance.

## 2020-07-13 ENCOUNTER — Telehealth: Payer: Self-pay | Admitting: *Deleted

## 2020-07-13 NOTE — Chronic Care Management (AMB) (Signed)
  Chronic Care Management   Note  07/13/2020 Name: Sherry Jones MRN: [id number] DOB: August 28, 1941  Sherry Jones is a 79 y.o. year old female who is a primary care patient of [name]. I reached out to Sherry Jones by phone today in response to a referral sent by Ms. Riona L Mcbrearty's PCP, Mar Daring, PA-C.     [name] was given information about Chronic Care Management services today including:  1. CCM service includes personalized support from designated clinical staff supervised by her physician, including individualized plan of care and coordination with other care providers 2. 24/7 contact phone numbers for assistance for urgent and routine care needs. 3. Service will only be billed when office clinical staff spend 20 minutes or more in a month to coordinate care. 4. Only one practitioner may furnish and bill the service in a calendar month. 5. The patient may stop CCM services at any time (effective at the end of the month) by phone call to the office staff. 6. The patient will be responsible for cost sharing (co-pay) of up to 20% of the service fee (after annual deductible is met).  Patient agreed to services and verbal consent obtained.   Follow up plan: Telephone appointment with care management team member scheduled for: 08/07/2020  [place] [facility]  Direct Dial: [phone]

## 2020-07-14 NOTE — Telephone Encounter (Signed)
Advised patient that samples are up front ready for pick up.

## 2020-07-30 ENCOUNTER — Emergency Department
Admission: EM | Admit: 2020-07-30 | Discharge: 2020-07-31 | Disposition: A | Discharge status: Home / Self Care | Payer: Medicare Other | Attending: Emergency Medicine | Admitting: Emergency Medicine

## 2020-07-30 ENCOUNTER — Telehealth: Payer: Self-pay | Admitting: Physician Assistant

## 2020-07-30 ENCOUNTER — Other Ambulatory Visit: Payer: Self-pay

## 2020-07-30 ENCOUNTER — Emergency Department: Payer: Medicare Other

## 2020-07-30 ENCOUNTER — Encounter: Payer: Self-pay | Admitting: *Deleted

## 2020-07-30 DIAGNOSIS — I82B12 Acute embolism and thrombosis of left subclavian vein: Secondary | ICD-10-CM | POA: Diagnosis not present

## 2020-07-30 DIAGNOSIS — M19012 Primary osteoarthritis, left shoulder: Secondary | ICD-10-CM | POA: Diagnosis not present

## 2020-07-30 DIAGNOSIS — I8290 Acute embolism and thrombosis of unspecified vein: Secondary | ICD-10-CM

## 2020-07-30 DIAGNOSIS — I10 Essential (primary) hypertension: Secondary | ICD-10-CM | POA: Diagnosis not present

## 2020-07-30 DIAGNOSIS — E039 Hypothyroidism, unspecified: Secondary | ICD-10-CM | POA: Insufficient documentation

## 2020-07-30 DIAGNOSIS — I749 Embolism and thrombosis of unspecified artery: Secondary | ICD-10-CM | POA: Diagnosis not present

## 2020-07-30 DIAGNOSIS — M542 Cervicalgia: Secondary | ICD-10-CM | POA: Insufficient documentation

## 2020-07-30 DIAGNOSIS — M25512 Pain in left shoulder: Secondary | ICD-10-CM | POA: Insufficient documentation

## 2020-07-30 DIAGNOSIS — Z8542 Personal history of malignant neoplasm of other parts of uterus: Secondary | ICD-10-CM | POA: Diagnosis not present

## 2020-07-30 DIAGNOSIS — Z96652 Presence of left artificial knee joint: Secondary | ICD-10-CM | POA: Insufficient documentation

## 2020-07-30 DIAGNOSIS — E119 Type 2 diabetes mellitus without complications: Secondary | ICD-10-CM | POA: Insufficient documentation

## 2020-07-30 DIAGNOSIS — I708 Atherosclerosis of other arteries: Secondary | ICD-10-CM | POA: Diagnosis not present

## 2020-07-30 DIAGNOSIS — I672 Cerebral atherosclerosis: Secondary | ICD-10-CM | POA: Diagnosis not present

## 2020-07-30 DIAGNOSIS — Z87891 Personal history of nicotine dependence: Secondary | ICD-10-CM | POA: Insufficient documentation

## 2020-07-30 DIAGNOSIS — Z79890 Hormone replacement therapy (postmenopausal): Secondary | ICD-10-CM | POA: Insufficient documentation

## 2020-07-30 DIAGNOSIS — R079 Chest pain, unspecified: Secondary | ICD-10-CM | POA: Diagnosis not present

## 2020-07-30 DIAGNOSIS — H02831 Dermatochalasis of right upper eyelid: Secondary | ICD-10-CM | POA: Diagnosis not present

## 2020-07-30 DIAGNOSIS — Z7901 Long term (current) use of anticoagulants: Secondary | ICD-10-CM | POA: Diagnosis not present

## 2020-07-30 NOTE — ED Triage Notes (Signed)
Pt ambulatory to triage.  Pt has left shoulder pain for 2 days.  Surgery on shoulder 2 years ago.  Pt denies chest pain or sob.  No known injury to shoulder.  Pt alert  Speech clear.

## 2020-07-30 NOTE — Telephone Encounter (Signed)
Patient came to front desk asking for advise due to the pain in left side of jaw, neck, front and back shoulder pain that started last night. The pain is getting worse.   Patient was advised to go to an Urgent Care or ER right way.  She understood instruction and will be going to the hospital after leaving our office.  Thanks, [employer]

## 2020-07-30 NOTE — ED Provider Notes (Signed)
[facility] Emergency Department Provider Note  ____________________________________________  Time seen: Approximately 10:50 PM  I have reviewed the triage vital signs and the nursing notes.   HISTORY  Chief Complaint Shoulder Pain    HPI Sherry Jones is a 79 y.o. female who presents the emergency department complaining of left shoulder and left anterior neck pain.  Patient states that she has had symptoms x1 day.  It is both a sharp/pulling/pressure sensation to the left shoulder neck.  Majority of sensation is along the supraclavicular region extending along the left anterior neck to the left ear.  She denies any trauma.  She states that she has a feeling as if the left side of her neck is swollen when compared with her right.  She denies any trauma to the shoulder.  She denies any sore throat, difficulty swallowing or breathing.  She denies any cardiac history.  No substernal pain.  No cough, shortness of breath.  She does state that she has a history of a rotator cuff tear that was repaired to the left shoulder.  She states that the pain is in a different location than where her tear/repair was.  She does have limited range of motion due to the pain.  No radiation down her left arm.  She denies any neck pain.  No medications for this complaint prior to arrival.  She states that she came to the emergency department because she is about to be traveling and did not want to be traveling if there was "a big problem."  Patient with a history of uterine cancer, non-Hodgkin's lymphoma, diabetes, hypercholesterolemia, degenerative disc disease of the cervical spine, rotator cuff tear with repair, DVT, hypothyroidism.         Past Medical History:  Diagnosis Date  . Cancer ([place])    uterine  . Diabetes mellitus without complication ([place])   . Hypercholesterolemia   . Thyroid disease     Patient Active Problem List   Diagnosis Date Noted  . Asymptomatic varicose  veins of left lower extremity 09/09/2019  . Acute deep vein thrombosis (DVT) of femoral vein of right lower extremity ([place]) 07/30/2019  . Age-related osteoporosis without current pathological fracture 04/19/2018  . H/O non-Hodgkin's lymphoma 01/23/2017  . History of deep vein thrombosis (DVT) of lower extremity 01/23/2017  . Back pain 08/04/2016  . Diabetes ([place]) 08/04/2016  . Gonalgia 08/25/2015  . Contact dermatitis 08/11/2015  . Atypical nevus 05/29/2015  . Shoulder pain, left 05/29/2015  . Hypercholesteremia 05/26/2015  . 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 H/O ear disorder 06/04/2014  . History of uterine cancer 06/04/2014  . Avitaminosis D 11/20/2009  . Acquired hypothyroidism 11/26/2007  . Barrett esophagus 11/23/2007  . Narrowing of intervertebral disc space 11/23/2007  . Allergic rhinitis 11/22/2007  . Arthritis, degenerative 11/22/2007  . Disease of airway 11/22/2007    Past Surgical History:  Procedure Laterality Date  . ABDOMINAL HYSTERECTOMY    . CATARACT EXTRACTION    . REPLACEMENT TOTAL KNEE     left knee  . ROTATOR CUFF REPAIR Left     Prior to Admission medications   Medication Sig Start Date End Date Taking? Authorizing Provider  [facility] CALCIUM 600 MG tablet Take 1 tablet by mouth 2 (two) times daily with a meal. 01/15/18   [provider]  levothyroxine (SYNTHROID) 75 MCG tablet TAKE 1 TABLET BY MOUTH EVERY DAY 05/22/20   Mar Daring,  PA-C  simvastatin (ZOCOR) 10 MG tablet TAKE 1 TABLET BY MOUTH EVERYDAY AT BEDTIME 05/15/20   Mar Daring, PA-C  Vitamin D, Ergocalciferol, (DRISDOL) 1.25 MG (50000 UNIT) CAPS capsule TAKE 1 CAPSULE (50,000 UNITS TOTAL) BY MOUTH EVERY 30 (THIRTY) DAYS. 03/05/20   Burnette, Clearnce Sorrel, PA-C  XARELTO 20 MG TABS tablet TAKE 1 TABLET (20 MG TOTAL) BY MOUTH DAILY WITH SUPPER. 06/25/20   Mar Daring, PA-C    Allergies Cefadroxil,  Cefuroxime, Cefuroxime axetil, Cephalosporins, Ciprofloxacin, Codeine, Hydrocodone-acetaminophen, Levofloxacin, Meloxicam, Oxycodone, Tramadol, Gabapentin, Macrolides and ketolides, Methocarbamol, Nitrofuran derivatives, and Nitrofurantoin  Family History  Problem Relation Age of Onset  . Alzheimer's disease Mother   . Diabetes Brother   . Osteoarthritis Brother   . Hypertension Son   . Heart attack Son        stents placed  . Breast cancer Neg Hx     Social History Social History   Tobacco Use  . Smoking status: Former Smoker    Years: 36.00    Quit date: 01/02/1996    Years since quitting: 24.5  . Smokeless tobacco: Never Used  Vaping Use  . Vaping Use: Never used  Substance Use Topics  . Alcohol use: No  . Drug use: No     Review of Systems  Constitutional: No fever/chills Eyes: No visual changes. No discharge ENT: No upper respiratory complaints. Cardiovascular: no chest pain. Respiratory: no cough. No SOB. Gastrointestinal: No abdominal pain.  No nausea, no vomiting.  No diarrhea.  No constipation. Genitourinary: Negative for dysuria. No hematuria Musculoskeletal: Left anterior shoulder pain extending into the left neck Skin: Negative for rash, abrasions, lacerations, ecchymosis. Neurological: Negative for headaches, focal weakness or numbness.  10 System ROS otherwise negative.  ____________________________________________   PHYSICAL EXAM:  VITAL SIGNS: ED Triage Vitals  Enc Vitals Group     BP 07/30/20 2007 (!) 175/61     Pulse Rate 07/30/20 2007 91     Resp 07/30/20 2007 20     Temp 07/30/20 2007 98.5 F (36.9 C)     Temp Source 07/30/20 2007 Oral     SpO2 07/30/20 2007 97 %     Weight 07/30/20 2003 116 lb (52.6 kg)     Height 07/30/20 2003 4\' 5"  (1.346 m)     Head Circumference --      Peak Flow --      Pain Score 07/30/20 2003 8     Pain Loc --      Pain Edu? --      Excl. in Time? --      Constitutional: Alert and oriented. Well appearing  and in no acute distress. Eyes: Conjunctivae are normal. PERRL. EOMI. Head: Atraumatic. ENT:      Ears:       Nose: No congestion/rhinnorhea.      Mouth/Throat: Mucous membranes are moist.  Neck: No stridor.  No cervical spine tenderness to palpation.  No paraspinal muscle tenderness.  No visible edema, erythema along the neck.  Patient is tender to palpation along the anterior neck extending from just inferior to the ear along the lateral and anterior aspect to the level of the clavicle.  No palpable abnormality.  No bruits on exam.  This tenderness extends into the supraclavicular region.  Radial pulses and sensation intact left upper extremity Cardiovascular: Normal rate, regular rhythm. Normal S1 and S2.  Good peripheral circulation. Respiratory: Normal respiratory effort without tachypnea or retractions. Lungs CTAB. Good air entry to the  bases with no decreased or absent breath sounds. Musculoskeletal: Full range of motion to all extremities. No gross deformities appreciated.  Visualization of the left shoulder reveals no visible deformity, erythema, edema.  Limited range of motion due to pain.  Patient is very tender to palpation in the supra clavicular region extending into the left anterior neck.  There is some mild to moderate tenderness along the scapular ridge as well.  No palpable findings.  Examination of the left humerus, left elbow and wrist is unremarkable.  Radial pulse and sensation intact to the left upper extremity Neurologic:  Normal speech and language. No gross focal neurologic deficits are appreciated.  Cranial nerves II through XII grossly intact. Skin:  Skin is warm, dry and intact. No rash noted. Psychiatric: Mood and affect are normal. Speech and behavior are normal. Patient exhibits appropriate insight and judgement.   ____________________________________________   LABS (all labs ordered are listed, but only abnormal results are displayed)  Labs Reviewed   COMPREHENSIVE METABOLIC PANEL  CBC WITH DIFFERENTIAL/PLATELET  TROPONIN I (HIGH SENSITIVITY)   ____________________________________________  EKG   ____________________________________________  RADIOLOGY   No results found.  ____________________________________________    PROCEDURES  Procedure(s) performed:    Procedures    Medications - No data to display   ____________________________________________   INITIAL IMPRESSION / ASSESSMENT AND PLAN / ED COURSE  Pertinent labs & imaging results that were available during my care of the patient were reviewed by me and considered in my medical decision making (see chart for details).  Review of the [place] CSRS was performed in accordance of the [place] prior to dispensing any controlled drugs.           Patient presents to the emergency department with sharp left shoulder pain.  Patient has no cardiac history.  Denies any substernal chest pain or shortness of breath.  There is no radiation of pain down the left extremity.  Patient's primary pain is along the left anterior shoulder extending into the left anterior neck.  This tenderness is along the left supraclavicular region extending into the left anterior neck.  No palpable findings in this area.  No bruits on exam.  Patient does have some tenderness extending along the left scapular ridge as well.  There is no palpable findings.  Pulse and sensation intact to the left upper extremity.  Given the nature of the patient's complaint, her age, other medical history I feel that patient would be best suited with labs, imaging of the chest and neck.  Differential includes carotid artery dissection, thrombosis of the carotid artery, rotator cuff tear, rotator cuff tendinitis, nontraumatic fracture, ACS/STEMI.  At this time, the section of the emergency department was closing and the patient will be transferred to the major side emergency department pending her work-up.  I discussed the  patient's presentation, physical exam findings with attending provider, Dr. Karma Greaser.  Patient care will be transferred to Dr. Karma Greaser pending final diagnosis and disposition.       This chart was dictated using voice recognition software/Dragon. Despite best efforts to proofread, errors can occur which can change the meaning. Any change was purely unintentional.    Darletta Moll, PA-C 07/30/20 2317    Hinda Kehr, MD 07/31/20 [phone]

## 2020-07-31 ENCOUNTER — Emergency Department: Payer: Medicare Other

## 2020-07-31 ENCOUNTER — Telehealth: Payer: Self-pay | Admitting: *Deleted

## 2020-07-31 DIAGNOSIS — I708 Atherosclerosis of other arteries: Secondary | ICD-10-CM | POA: Diagnosis not present

## 2020-07-31 DIAGNOSIS — M25512 Pain in left shoulder: Secondary | ICD-10-CM | POA: Diagnosis not present

## 2020-07-31 DIAGNOSIS — I672 Cerebral atherosclerosis: Secondary | ICD-10-CM | POA: Diagnosis not present

## 2020-07-31 DIAGNOSIS — I82B12 Acute embolism and thrombosis of left subclavian vein: Secondary | ICD-10-CM | POA: Diagnosis not present

## 2020-07-31 DIAGNOSIS — R079 Chest pain, unspecified: Secondary | ICD-10-CM | POA: Diagnosis not present

## 2020-07-31 LAB — TROPONIN I (HIGH SENSITIVITY)
Troponin I (High Sensitivity): 5 ng/L (ref <18)
Troponin I (High Sensitivity): 5 ng/L (ref <18)

## 2020-07-31 LAB — CBC WITH DIFFERENTIAL/PLATELET
Abs Immature Granulocytes: 0.02 10*3/uL (ref 0.00–0.07)
Basophils Absolute: 0.1 10*3/uL (ref 0.0–0.1)
Basophils Relative: 1 %
Eosinophils Absolute: 0.1 10*3/uL (ref 0.0–0.5)
Eosinophils Relative: 2 %
HCT: 34.6 % — ABNORMAL LOW (ref 36.0–46.0)
Hemoglobin: 11.7 g/dL — ABNORMAL LOW (ref 12.0–15.0)
Immature Granulocytes: 0 %
Lymphocytes Relative: 23 %
Lymphs Abs: 1.4 10*3/uL (ref 0.7–4.0)
MCH: 29.8 pg (ref 26.0–34.0)
MCHC: 33.8 g/dL (ref 30.0–36.0)
MCV: 88 fL (ref 80.0–100.0)
Monocytes Absolute: 0.6 10*3/uL (ref 0.1–1.0)
Monocytes Relative: 10 %
Neutro Abs: 4 10*3/uL (ref 1.7–7.7)
Neutrophils Relative %: 64 %
Platelets: 177 10*3/uL (ref 150–400)
RBC: 3.93 MIL/uL (ref 3.87–5.11)
RDW: 14.9 % (ref 11.5–15.5)
WBC: 6.2 10*3/uL (ref 4.0–10.5)
nRBC: 0 % (ref 0.0–0.2)

## 2020-07-31 LAB — COMPREHENSIVE METABOLIC PANEL
ALT: 15 U/L (ref 0–44)
AST: 19 U/L (ref 15–41)
Albumin: 4.2 g/dL (ref 3.5–5.0)
Alkaline Phosphatase: 53 U/L (ref 38–126)
Anion gap: 10 (ref 5–15)
BUN: 13 mg/dL (ref 8–23)
CO2: 26 mmol/L (ref 22–32)
Calcium: 8.5 mg/dL — ABNORMAL LOW (ref 8.9–10.3)
Chloride: 101 mmol/L (ref 98–111)
Creatinine, Ser: 0.61 mg/dL (ref 0.44–1.00)
GFR, Estimated: >60 mL/min (ref >60)
Glucose, Bld: 123 mg/dL — ABNORMAL HIGH (ref 70–99)
Potassium: 3.8 mmol/L (ref 3.5–5.1)
Sodium: 137 mmol/L (ref 135–145)
Total Bilirubin: 0.7 mg/dL (ref 0.3–1.2)
Total Protein: 7.4 g/dL (ref 6.5–8.1)

## 2020-07-31 MED ORDER — IOHEXOL 350 MG/ML SOLN
100.0000 mL | Freq: Once | INTRAVENOUS | Status: AC | PRN
  Administered 2020-07-31: 100 mL via INTRAVENOUS

## 2020-07-31 MED ORDER — HYDROCODONE-ACETAMINOPHEN 5-325 MG PO TABS: 1.0000 | ORAL_TABLET | Freq: Four times a day (QID) | ORAL | 0 refills | Status: DC | PRN

## 2020-07-31 NOTE — ED Notes (Signed)
Patient transported to CT on stretcher

## 2020-07-31 NOTE — ED Provider Notes (Signed)
-----------------------------------------   12:26 AM on 07/31/2020 -----------------------------------------  Assuming care from [facility].  In short, Sherry Jones is a 79 y.o. female with a chief complaint of shoulder pain.  Refer to the original H&P for additional details.  The current plan of care is to follow up labs and CTA neck/chest.    ----------------------------------------- 5:34 AM on 07/31/2020 -----------------------------------------  (Note that documentation was delayed due to multiple ED patients requiring immediate care.)  The CTA head and neck were generally unremarkable.  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  I reexamined the patient and this does not seem to correlate with her pain which seems to be very much at the superior aspect of the patient's shoulder and leading up to the neck but not in the distribution of the subclavian artery.  There is no obvious swelling that I can appreciate and she has pain with range of motion of the left shoulder.  I believe this is most likely musculoskeletal.  The patient confirms she is on Xarelto and I discussed the case by phone with Dr. Delana Meyer with vascular surgery.  He agreed that since she is on Xarelto that she can follow-up in clinic and continue with her medications but the thrombus in the subclavian artery is unlikely to be because of her pain.  I updated the patient and she seemed relieved and happy with the plan for discharge and outpatient follow-up.  I gave my usual and customary return precautions.  Patient is neurovascularly intact at the time of discharge.    Hinda Kehr, MD 07/31/20 [phone]

## 2020-07-31 NOTE — Chronic Care Management (AMB) (Signed)
  Care Management   Note  07/31/2020 Name: [name] MRN: [id number] DOB: 06-16-1941  Sherry Jones is a 79 y.o. year old female who is a primary care patient of [name] and is actively engaged with the care management team. I reached out to Sherry Jones by phone today to assist with canceling  an initial visit with the Pharmacist.  Follow up plan: Unsuccessful telephone outreach attempt made. A HIPAA compliant phone message was left for the patient providing contact information and requesting a return call. If patient returns call to provider office, please advise to call [place] at [phone].  [place] Management

## 2020-07-31 NOTE — ED Notes (Signed)
Patient transported from CT on stretcher.

## 2020-07-31 NOTE — Chronic Care Management (AMB) (Signed)
Spoke with patient she is aware her call with pharmacy call is canceled and that I will call her and reschedule once we have a new Pharmacist onboard.   [place] Management  Direct Dial: [phone]

## 2020-07-31 NOTE — Discharge Instructions (Addendum)
As we discussed, your work-up was generally reassuring tonight.  You have what is called a thrombus (a partial clot) in your left subclavian artery but that does not seem to be the cause of your symptoms.  Otherwise your scans were generally normal.  You most likely are experiencing musculoskeletal pain with your left shoulder and we encourage you to try the medication prescribed.  Continue to take your regular medications as well.  Return to the emergency department if you develop new or worsening symptoms that concern you.

## 2020-08-06 DIAGNOSIS — H02403 Unspecified ptosis of bilateral eyelids: Secondary | ICD-10-CM | POA: Diagnosis not present

## 2020-08-07 ENCOUNTER — Telehealth: Payer: Medicare Other

## 2020-08-26 NOTE — Chronic Care Management (AMB) (Signed)
  Care Management   Note  08/26/2020 Name: ZYNASIA BURKLOW MRN: [id number] DOB: 1941-07-03  [name] is a 79 y.o. year old female who is a primary care patient of [name] and is actively engaged with the care management team. I reached out to [name] by phone today to assist with re-scheduling an initial visit with the Pharmacist.  Follow up plan: Telephone appointment with care management team member scheduled for: 09/11/2020  [place] Management

## 2020-09-02 NOTE — Chronic Care Management (AMB) (Deleted)
Chronic Care Management Pharmacy  Name: Sherry Jones  MRN: [id number] DOB: Mar 24, 1941   Chief Complaint/ HPI  Sherry Jones,  79 y.o. , female presents for her Initial CCM visit with the clinical pharmacist via telephone.  PCP : Sherry Daring, PA-C Patient Care Team: Sherry Jones as PCP - General (Family Medicine) [facility], [facility] (Optometry) Sherry Jones, [facility] (Pharmacist)  Patient's chronic conditions include: Hyperlipidemia, Diabetes, GERD, Hypothyroidism, Osteoporosis, Allergic Rhinitis and History of DVT    Office Visits: 04/20/20: Patient presented to Fenton Malling, PA-C for follow-up.  A1c stable at 6.0%. eGFR, lipids, UACR stable  Consult Visit: 07/30/20: Patient presented to ED for acute pain of shoulder. Patient discharged with hydrocodone-APAP 5-325 mg   Allergies  Allergen Reactions  . Cefadroxil   . Cefuroxime   . Cefuroxime Axetil     Other reaction(s): Unknown Other reaction(s): NAUSEA  . Cephalosporins Other (See Comments)  . Ciprofloxacin Other (See Comments)  . Codeine Swelling  . Hydrocodone-Acetaminophen Other (See Comments)    Other reaction(s): Other (See Comments) Other reaction(s): Unknown Other reaction(s): Other (See Comments)  . Levofloxacin Other (See Comments)  . Meloxicam   . Oxycodone     Other reaction(s): Syncope  . Tramadol     Other reaction(s): Unknown  . Gabapentin Nausea And Vomiting  . Macrolides And Ketolides     Other reaction(s): Other (See Comments) Other Reaction: Intolerance Other reaction(s): Other (See Comments) Other Reaction: Intolerance Other reaction(s): Other (See Comments) Other Reaction: Intolerance Other reaction(s): Other (See Comments) Other Reaction: Intolerance  . Methocarbamol Rash  . Nitrofuran Derivatives Other (See Comments)    Other Reaction: Intolerance  . Nitrofurantoin     Other reaction(s): Other (See Comments) Other Reaction: Intolerance Other  reaction(s): Other (See Comments) Other Reaction: Intolerance    Medications: Outpatient Encounter Medications as of 09/11/2020  Medication Sig  . [facility] CALCIUM 600 MG tablet Take 1 tablet by mouth 2 (two) times daily with a meal.  . HYDROcodone-acetaminophen (NORCO/VICODIN) 5-325 MG tablet Take 1 tablet by mouth every 6 (six) hours as needed for moderate pain or severe pain.  Marland Kitchen levothyroxine (SYNTHROID) 75 MCG tablet TAKE 1 TABLET BY MOUTH EVERY DAY  . simvastatin (ZOCOR) 10 MG tablet TAKE 1 TABLET BY MOUTH EVERYDAY AT BEDTIME  . Vitamin D, Ergocalciferol, (DRISDOL) 1.25 MG (50000 UNIT) CAPS capsule TAKE 1 CAPSULE (50,000 UNITS TOTAL) BY MOUTH EVERY 30 (THIRTY) DAYS.  Marland Kitchen XARELTO 20 MG TABS tablet TAKE 1 TABLET (20 MG TOTAL) BY MOUTH DAILY WITH SUPPER.   No facility-administered encounter medications on file as of 09/11/2020.    Wt Readings from Last 3 Encounters:  07/30/20 116 lb (52.6 kg)  05/12/20 116 lb (52.6 kg)  04/20/20 117 lb 6.4 oz (53.3 kg)    Current Diagnosis/Assessment:    Goals Addressed   None    Hyperlipidemia   LDL goal < ***  Last lipids Lab Results  Component Value Date   CHOL 181 04/27/2020   HDL 70 04/27/2020   LDLCALC 93 04/27/2020   TRIG 99 04/27/2020   CHOLHDL 2.6 04/27/2020   Hepatic Function Latest Ref Rng & Units 07/31/2020 04/27/2020 04/18/2019  Total Protein 6.5 - 8.1 g/dL 7.4 7.1 7.0  Albumin 3.5 - 5.0 g/dL 4.2 4.3 4.5  AST 15 - 41 U/L 19 22 20   ALT 0 - 44 U/L 15 14 14   Alk Phosphatase 38 - 126 U/L 53 59 54  Total Bilirubin 0.3 -  1.2 mg/dL 0.7 0.6 0.5     The 10-year ASCVD risk score Sherry Jones DC Jr., et al., 2013) is: 42.7%   Values used to calculate the score:     Age: 38 years     Sex: Female     Is Non-Hispanic African American: No     Diabetic: Yes     Tobacco smoker: No     Systolic Blood Pressure: 448 mmHg     Is BP treated: No     HDL Cholesterol: 70 mg/dL     Total Cholesterol: 181 mg/dL   Patient has failed these meds in  past: *** Patient is currently {CHL Controlled/Uncontrolled:[phone]} on the following medications:  . Simvastatin 10 mg QHS   We discussed:  {CHL HP Upstream Pharmacy discussion:[phone]}  Plan  Continue {CHL HP Upstream Pharmacy Plans:[phone]}  Osteoporosis   Last DEXA Scan: 05/12/20   T-Score femoral neck: n/a  T-Score total hip: n/a  T-Score lumbar spine: n/a  T-Score forearm radius: -2.6  10-year probability of major osteoporotic fracture: n/a  10-year probability of hip fracture: n/a  Vit D, 25-Hydroxy  Date Value Ref Range Status  04/27/2020 38.1 30.0 - 100.0 ng/mL Final    Comment:    Vitamin D deficiency has been defined by the Institute of Medicine and an Endocrine Society practice guideline as a level of serum 25-OH vitamin D less than 20 ng/mL (1,2). The Endocrine Society went on to further define vitamin D insufficiency as a level between 21 and 29 ng/mL (2). 1. IOM (Institute of Medicine). 2010. Dietary reference    intakes for calcium and D. Solomon: The    [employer]. 2. Holick MF, Binkley Edgar, Bischoff-Ferrari HA, et al.    Evaluation, treatment, and prevention of vitamin D    deficiency: an Endocrine Society clinical practice    guideline. JCEM. 2011 Jul; 96(7):1911-30.      Patient {is;is not an osteoporosis candidate:23886}  Patient has failed these meds in past: *** Patient is currently {CHL Controlled/Uncontrolled:[phone]} on the following medications:  . Calcium 600 mg twice daily  . Ergocalciferol 50,000 units every 30 days   We discussed:  {Osteoporosis Counseling:23892}  Plan  Continue {CHL HP Upstream Pharmacy Plans:[phone]}  Hypothyroidism   Lab Results  Component Value Date/Time   TSH 1.370 04/27/2020 10:26 AM   TSH 2.000 04/18/2019 09:11 AM    Patient has failed these meds in past: *** Patient is currently {CHL Controlled/Uncontrolled:[phone]} on the following medications:  . ***  We  discussed:  {CHL HP Upstream Pharmacy discussion:[phone]}  Plan  Continue {CHL HP Upstream Pharmacy Plans:[phone]}  Chronic Pain   Patient has failed these meds in past: *** Patient is currently {CHL Controlled/Uncontrolled:[phone]} on the following medications:  . Hydrocodone-APAP 5-325 q6hr PRN   We discussed:  ***  Plan  Continue {CHL HP Upstream Pharmacy Plans:[phone]}  History of DVT    Patient has failed these meds in past: *** Patient is currently {CHL Controlled/Uncontrolled:[phone]} on the following medications:  . Xarelto 20 mg daily   We discussed:  ***  Plan  Continue {CHL HP Upstream Pharmacy JEHUD:[id number]}   Medication Management   [facility]'s [facility]  [id number] [place] [place]Phone: [phone]: [phone] Uses pill box? {Yes or If no, why not?:20788} Pt endorses ***% compliance  We discussed: {Pharmacy options:24294}  Plan  {US Pharmacy PMOQH:47654}   Follow up: *** month phone  visit  ***

## 2020-09-11 ENCOUNTER — Telehealth: Payer: Medicare Other

## 2020-09-15 ENCOUNTER — Telehealth: Payer: Self-pay | Admitting: *Deleted

## 2020-09-15 NOTE — Chronic Care Management (AMB) (Signed)
  Care Management   Note  09/15/2020 Name: [name] MRN: [id number] DOB: 26-Oct-1940  Sherry Jones is a 79 y.o. year old female who is a primary care patient of [name] and is actively engaged with the care management team. I reached out to Sherry Jones by phone today to assist with re-scheduling an initial visit with the Pharmacist  Follow up plan: Unsuccessful telephone outreach attempt made. A HIPAA compliant phone message was left for the patient providing contact information and requesting a return call. The care management team will reach out to the patient again over the next 7 days. If patient returns call to provider office, please advise to call [place] at [place] Management

## 2020-09-16 NOTE — Chronic Care Management (AMB) (Signed)
  Care Management   Note  09/15/2020 Name: [name] MRN: [id number] DOB: [birth year]  Sherry Jones is a 79 y.o. year old female who is a primary care patient of [name] and is actively engaged with the care management team. I reached out to Sherry Jones by phone today to assist with re-scheduling a follow up visit with the Pharmacist  Follow up plan: Telephone appointment with care management team member scheduled for: 10/27/2020  [place] Management

## 2020-10-09 ENCOUNTER — Ambulatory Visit (INDEPENDENT_AMBULATORY_CARE_PROVIDER_SITE_OTHER): Payer: Medicare Other | Admitting: Physician Assistant

## 2020-10-09 DIAGNOSIS — Z91199 Patient's noncompliance with other medical treatment and regimen due to unspecified reason: Secondary | ICD-10-CM

## 2020-10-09 DIAGNOSIS — Z5329 Procedure and treatment not carried out because of patient's decision for other reasons: Secondary | ICD-10-CM

## 2020-10-09 NOTE — Progress Notes (Signed)
Patient was contacted on multiple attempts. Her phone would go straight to VM. 3 different VM were left (2 by provider and 1 by CMA)

## 2020-10-12 ENCOUNTER — Other Ambulatory Visit: Payer: Self-pay

## 2020-10-12 ENCOUNTER — Emergency Department
Admission: EM | Admit: 2020-10-12 | Discharge: 2020-10-12 | Disposition: A | Discharge status: Home / Self Care | Payer: Medicare Other | Attending: Emergency Medicine | Admitting: Emergency Medicine

## 2020-10-12 ENCOUNTER — Telehealth: Payer: Self-pay | Admitting: Physician Assistant

## 2020-10-12 ENCOUNTER — Ambulatory Visit: Payer: Self-pay | Admitting: *Deleted

## 2020-10-12 ENCOUNTER — Encounter: Payer: Self-pay | Admitting: Emergency Medicine

## 2020-10-12 ENCOUNTER — Emergency Department: Payer: Medicare Other

## 2020-10-12 DIAGNOSIS — R0602 Shortness of breath: Secondary | ICD-10-CM | POA: Diagnosis not present

## 2020-10-12 DIAGNOSIS — Z5321 Procedure and treatment not carried out due to patient leaving prior to being seen by health care provider: Secondary | ICD-10-CM | POA: Insufficient documentation

## 2020-10-12 DIAGNOSIS — E119 Type 2 diabetes mellitus without complications: Secondary | ICD-10-CM

## 2020-10-12 DIAGNOSIS — I959 Hypotension, unspecified: Secondary | ICD-10-CM | POA: Diagnosis not present

## 2020-10-12 DIAGNOSIS — E7800 Pure hypercholesterolemia, unspecified: Secondary | ICD-10-CM

## 2020-10-12 DIAGNOSIS — R531 Weakness: Secondary | ICD-10-CM | POA: Insufficient documentation

## 2020-10-12 DIAGNOSIS — I82411 Acute embolism and thrombosis of right femoral vein: Secondary | ICD-10-CM

## 2020-10-12 DIAGNOSIS — R001 Bradycardia, unspecified: Secondary | ICD-10-CM | POA: Diagnosis not present

## 2020-10-12 LAB — BASIC METABOLIC PANEL
Anion gap: 22 — ABNORMAL HIGH (ref 5–15)
BUN: 12 mg/dL (ref 8–23)
CO2: 15 mmol/L — ABNORMAL LOW (ref 22–32)
Calcium: 8 mg/dL — ABNORMAL LOW (ref 8.9–10.3)
Chloride: 97 mmol/L — ABNORMAL LOW (ref 98–111)
Creatinine, Ser: 0.76 mg/dL (ref 0.44–1.00)
GFR, Estimated: >60 mL/min (ref >60)
Glucose, Bld: 72 mg/dL (ref 70–99)
Potassium: 3 mmol/L — ABNORMAL LOW (ref 3.5–5.1)
Sodium: 134 mmol/L — ABNORMAL LOW (ref 135–145)

## 2020-10-12 LAB — CBC
HCT: 39.3 % (ref 36.0–46.0)
Hemoglobin: 13.7 g/dL (ref 12.0–15.0)
MCH: 29.6 pg (ref 26.0–34.0)
MCHC: 34.9 g/dL (ref 30.0–36.0)
MCV: 84.9 fL (ref 80.0–100.0)
Platelets: 146 10*3/uL — ABNORMAL LOW (ref 150–400)
RBC: 4.63 MIL/uL (ref 3.87–5.11)
RDW: 15.5 % (ref 11.5–15.5)
WBC: 5.9 10*3/uL (ref 4.0–10.5)
nRBC: 0 % (ref 0.0–0.2)

## 2020-10-12 NOTE — Telephone Encounter (Signed)
Patient having complaints of dizziness, weakness and nausea since Saturday. Patient states that the dizziness becomes worse with standing and she feels like she is going to pass out at times with standing. Patient states that she called EMS to her home on Saturday and was told that her VS were "good" and she did not go to the ED. Patient sounds weak during triage call. Patient advised that she would need to be seen in the ED or UC for current symptoms but patient wanted to know if she could possibly be seen in the office. Contacted FC and Josie,RN to discuss patient's symptoms and request. Per Josie,RN patient needs to go to ED for current symptoms. Patient advised to seek treatment in the ED. Understanding verbalized. Patient states that she is at home with her daughter currently who can take her to the ED.

## 2020-10-12 NOTE — Telephone Encounter (Signed)
Caller (daughter) states patient has not taken levothyroxine (SYNTHROID) 75 MCG tablet , simvastatin (ZOCOR) 10 MG tablet and XARELTO 20 MG TABS tablet in the last 3 months due to cost. Patient just started taking medication 3 days ago. Caller wanted to make PCP aware.

## 2020-10-12 NOTE — Telephone Encounter (Signed)
  Reason for Disposition . SEVERE dizziness (e.g., unable to stand, requires support to walk, feels like passing out now)  Answer Assessment - Initial Assessment Questions 1. DESCRIPTION: "Describe your dizziness."     Dizziness that gets worse with standing 2. LIGHTHEADED: "Do you feel lightheaded?" (e.g., somewhat faint, woozy, weak upon standing)     yes 3. VERTIGO: "Do you feel like either you or the room is spinning or tilting?" (i.e. vertigo)     n/a 4. SEVERITY: "How bad is it?"  "Do you feel like you are going to faint?" "Can you stand and walk?"   - MILD: Feels slightly dizzy, but walking normally.   - MODERATE: Feels very unsteady when walking, but not falling; interferes with normal activities (e.g., school, work) .   - SEVERE: Unable to walk without falling, or requires assistance to walk without falling; feels like passing out now.      severe 5. ONSET:  "When did the dizziness begin?"     On Saturday 6. AGGRAVATING FACTORS: "Does anything make it worse?" (e.g., standing, change in head position)     Standing and changing head positions 7. HEART RATE: "Can you tell me your heart rate?" "How many beats in 15 seconds?"  (Note: not all patients can do this)       Unable to  8. CAUSE: "What do you think is causing the dizziness?"     unknown 9. RECURRENT SYMPTOM: "Have you had dizziness before?" If Yes, ask: "When was the last time?" "What happened that time?"     No  10. OTHER SYMPTOMS: "Do you have any other symptoms?" (e.g., fever, chest pain, vomiting, diarrhea, bleeding)       nausea 11. PREGNANCY: "Is there any chance you are pregnant?" "When was your last menstrual period?"       n/a  Protocols used: DIZZINESS [facility]

## 2020-10-12 NOTE — Telephone Encounter (Signed)
Attempted to return call to patient after call dropped during transfer to nurse triage. No answer. Left message on voicemail to return call to discuss symptoms.

## 2020-10-12 NOTE — ED Triage Notes (Signed)
Pt BIB [facility] with c/o weakness and SOB x 1 week. Pt A&O x4 in triage.

## 2020-10-12 NOTE — ED Triage Notes (Signed)
First Nurse Note:  C/O weakness for one week.  VS wnl.

## 2020-10-13 ENCOUNTER — Telehealth: Payer: Medicare Other | Admitting: Physician Assistant

## 2020-10-13 ENCOUNTER — Telehealth: Payer: Self-pay | Admitting: Physician Assistant

## 2020-10-13 ENCOUNTER — Other Ambulatory Visit: Payer: Self-pay

## 2020-10-13 ENCOUNTER — Ambulatory Visit: Payer: Self-pay

## 2020-10-13 ENCOUNTER — Emergency Department
Admission: EM | Admit: 2020-10-13 | Discharge: 2020-10-13 | Disposition: A | Discharge status: Home / Self Care | Payer: Medicare Other | Attending: Emergency Medicine | Admitting: Emergency Medicine

## 2020-10-13 DIAGNOSIS — Z743 Need for continuous supervision: Secondary | ICD-10-CM | POA: Diagnosis not present

## 2020-10-13 DIAGNOSIS — Z96652 Presence of left artificial knee joint: Secondary | ICD-10-CM | POA: Diagnosis not present

## 2020-10-13 DIAGNOSIS — Z79899 Other long term (current) drug therapy: Secondary | ICD-10-CM | POA: Insufficient documentation

## 2020-10-13 DIAGNOSIS — Z8542 Personal history of malignant neoplasm of other parts of uterus: Secondary | ICD-10-CM | POA: Insufficient documentation

## 2020-10-13 DIAGNOSIS — E119 Type 2 diabetes mellitus without complications: Secondary | ICD-10-CM | POA: Insufficient documentation

## 2020-10-13 DIAGNOSIS — E039 Hypothyroidism, unspecified: Secondary | ICD-10-CM | POA: Diagnosis not present

## 2020-10-13 DIAGNOSIS — R0602 Shortness of breath: Secondary | ICD-10-CM | POA: Diagnosis not present

## 2020-10-13 DIAGNOSIS — R059 Cough, unspecified: Secondary | ICD-10-CM | POA: Diagnosis not present

## 2020-10-13 DIAGNOSIS — Z87891 Personal history of nicotine dependence: Secondary | ICD-10-CM | POA: Diagnosis not present

## 2020-10-13 DIAGNOSIS — U071 COVID-19: Secondary | ICD-10-CM | POA: Insufficient documentation

## 2020-10-13 DIAGNOSIS — R197 Diarrhea, unspecified: Secondary | ICD-10-CM | POA: Diagnosis not present

## 2020-10-13 LAB — BASIC METABOLIC PANEL WITH GFR
Anion gap: 16 — ABNORMAL HIGH (ref 5–15)
BUN: 11 mg/dL (ref 8–23)
CO2: 21 mmol/L — ABNORMAL LOW (ref 22–32)
Calcium: 8.1 mg/dL — ABNORMAL LOW (ref 8.9–10.3)
Chloride: 95 mmol/L — ABNORMAL LOW (ref 98–111)
Creatinine, Ser: 0.71 mg/dL (ref 0.44–1.00)
GFR, Estimated: >60 mL/min (ref >60)
Glucose, Bld: 90 mg/dL (ref 70–99)
Potassium: 3.3 mmol/L — ABNORMAL LOW (ref 3.5–5.1)
Sodium: 132 mmol/L — ABNORMAL LOW (ref 135–145)

## 2020-10-13 LAB — CBC
HCT: 35.1 % — ABNORMAL LOW (ref 36.0–46.0)
Hemoglobin: 12.4 g/dL (ref 12.0–15.0)
MCH: 29.3 pg (ref 26.0–34.0)
MCHC: 35.3 g/dL (ref 30.0–36.0)
MCV: 83 fL (ref 80.0–100.0)
Platelets: 159 K/uL (ref 150–400)
RBC: 4.23 MIL/uL (ref 3.87–5.11)
RDW: 15.6 % — ABNORMAL HIGH (ref 11.5–15.5)
WBC: 5.5 K/uL (ref 4.0–10.5)
nRBC: 0 % (ref 0.0–0.2)

## 2020-10-13 LAB — RESP PANEL BY RT-PCR (FLU A&B, COVID) ARPGX2
Influenza A by PCR: NEGATIVE
Influenza B by PCR: NEGATIVE
SARS Coronavirus 2 by RT PCR: POSITIVE — AB

## 2020-10-13 LAB — POC SARS CORONAVIRUS 2 AG -  ED: SARS Coronavirus 2 Ag: NEGATIVE

## 2020-10-13 MED ORDER — ONDANSETRON HCL 4 MG/2ML IJ SOLN
4.0000 mg | Freq: Once | INTRAMUSCULAR | Status: AC
  Administered 2020-10-13: 4 mg via INTRAVENOUS
  Filled 2020-10-13: qty 2

## 2020-10-13 MED ORDER — ONDANSETRON HCL 4 MG PO TABS: 4.0000 mg | ORAL_TABLET | Freq: Every day | ORAL | 0 refills | Status: DC | PRN

## 2020-10-13 MED ORDER — BENZONATATE 100 MG PO CAPS: 100.0000 mg | ORAL_CAPSULE | Freq: Four times a day (QID) | ORAL | 0 refills | Status: DC | PRN

## 2020-10-13 MED ORDER — SODIUM CHLORIDE 0.9 % IV BOLUS
1000.0000 mL | Freq: Once | INTRAVENOUS | Status: AC
  Administered 2020-10-13: 1000 mL via INTRAVENOUS

## 2020-10-13 MED ORDER — ACETAMINOPHEN 500 MG PO TABS
1000.0000 mg | ORAL_TABLET | Freq: Once | ORAL | Status: AC
  Administered 2020-10-13: 1000 mg via ORAL
  Filled 2020-10-13: qty 2

## 2020-10-13 NOTE — ED Provider Notes (Signed)
[facility] Emergency Department Provider Note  Time seen: 2:00 PM  I have reviewed the triage vital signs and the nursing notes.   HISTORY  Chief Complaint Shortness of Breath   HPI Sherry Jones is a 80 y.o. female with a past medical history of diabetes, hyperlipidemia, presents to the emergency department for cough and generalized fatigue.  According to the patient for the past 4 days she has been experiencing cough and generalized fatigue.  Her doctor ordered a chest x-ray which showed multifocal pneumonia and the patient was told to come to the emergency department.  Patient is not vaccinated against COVID.  Patient states she has been coughing but denies any significant shortness of breath.  Denies any nausea vomiting or diarrhea but does state generalized fatigue and weakness.  Does not believe she has had a fever.   Past Medical History:  Diagnosis Date  . Cancer ([place])    uterine  . Diabetes mellitus without complication ([place])   . Hypercholesterolemia   . Thyroid disease     Patient Active Problem List   Diagnosis Date Noted  . Asymptomatic varicose veins of left lower extremity 09/09/2019  . Acute deep vein thrombosis (DVT) of femoral vein of right lower extremity ([place]) 07/30/2019  . Age-related osteoporosis without current pathological fracture 04/19/2018  . H/O non-Hodgkin's lymphoma 01/23/2017  . History of deep vein thrombosis (DVT) of lower extremity 01/23/2017  . Back pain 08/04/2016  . Diabetes ([place]) 08/04/2016  . Gonalgia 08/25/2015  . Contact dermatitis 08/11/2015  . Atypical nevus 05/29/2015  . Shoulder pain, left 05/29/2015  . Hypercholesteremia 05/26/2015  . 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 H/O ear disorder 06/04/2014  . History of uterine cancer 06/04/2014  . Avitaminosis D 11/20/2009  . Acquired hypothyroidism 11/26/2007  . Barrett esophagus 11/23/2007  .  Narrowing of intervertebral disc space 11/23/2007  . Allergic rhinitis 11/22/2007  . Arthritis, degenerative 11/22/2007  . Disease of airway 11/22/2007    Past Surgical History:  Procedure Laterality Date  . ABDOMINAL HYSTERECTOMY    . CATARACT EXTRACTION    . REPLACEMENT TOTAL KNEE     left knee  . ROTATOR CUFF REPAIR Left     Prior to Admission medications   Medication Sig Start Date End Date Taking? Authorizing Provider  [facility] CALCIUM 600 MG tablet Take 1 tablet by mouth 2 (two) times daily with a meal. 01/15/18   [provider]  HYDROcodone-acetaminophen (NORCO/VICODIN) 5-325 MG tablet Take 1 tablet by mouth every 6 (six) hours as needed for moderate pain or severe pain. 07/31/20   Hinda Kehr, MD  levothyroxine (SYNTHROID) 75 MCG tablet TAKE 1 TABLET BY MOUTH EVERY DAY 05/22/20   Mar Daring, PA-C  simvastatin (ZOCOR) 10 MG tablet TAKE 1 TABLET BY MOUTH EVERYDAY AT BEDTIME 05/15/20   Mar Daring, PA-C  Vitamin D, Ergocalciferol, (DRISDOL) 1.25 MG (50000 UNIT) CAPS capsule TAKE 1 CAPSULE (50,000 UNITS TOTAL) BY MOUTH EVERY 30 (THIRTY) DAYS. 03/05/20   Mar Daring, PA-C  XARELTO 20 MG TABS tablet TAKE 1 TABLET (20 MG TOTAL) BY MOUTH DAILY WITH SUPPER. 06/25/20   Mar Daring, PA-C    Allergies  Allergen Reactions  . Cefadroxil   . Cefuroxime   . Cefuroxime Axetil     Other reaction(s): Unknown Other reaction(s): NAUSEA  . Cephalosporins Other (See Comments)  . Ciprofloxacin Other (See Comments)  . Codeine Swelling  .  Hydrocodone-Acetaminophen Other (See Comments)    Other reaction(s): Other (See Comments) Other reaction(s): Unknown Other reaction(s): Other (See Comments)  . Levofloxacin Other (See Comments)  . Meloxicam   . Oxycodone     Other reaction(s): Syncope  . Tramadol     Other reaction(s): Unknown  . Gabapentin Nausea And Vomiting  . Macrolides And Ketolides     Other reaction(s): Other (See Comments) Other Reaction:  Intolerance Other reaction(s): Other (See Comments) Other Reaction: Intolerance Other reaction(s): Other (See Comments) Other Reaction: Intolerance Other reaction(s): Other (See Comments) Other Reaction: Intolerance  . Methocarbamol Rash  . Nitrofuran Derivatives Other (See Comments)    Other Reaction: Intolerance  . Nitrofurantoin     Other reaction(s): Other (See Comments) Other Reaction: Intolerance Other reaction(s): Other (See Comments) Other Reaction: Intolerance    Family History  Problem Relation Age of Onset  . Alzheimer's disease Mother   . Diabetes Brother   . Osteoarthritis Brother   . Hypertension Son   . Heart attack Son        stents placed  . Breast cancer Neg Hx     Social History Social History   Tobacco Use  . Smoking status: Former Smoker    Years: 36.00    Quit date: 01/02/1996    Years since quitting: 24.7  . Smokeless tobacco: Never Used  Vaping Use  . Vaping Use: Never used  Substance Use Topics  . Alcohol use: No  . Drug use: No    Review of Systems Constitutional: Negative for fever.  Positive for generalized weakness. Cardiovascular: Negative for chest pain. Respiratory: Negative for shortness of breath.  Positive for cough x4 days. Gastrointestinal: Negative for abdominal pain, vomiting and diarrhea.  Genitourinary: Negative for urinary compaints Musculoskeletal: Negative for musculoskeletal complaints Neurological: Negative for headache All other ROS negative  ____________________________________________   PHYSICAL EXAM:  VITAL SIGNS: ED Triage Vitals  Enc Vitals Group     BP 10/13/20 1305 108/74     Pulse Rate 10/13/20 1305 78     Resp 10/13/20 1305 16     Temp 10/13/20 1305 98.2 F (36.8 C)     Temp Source 10/13/20 1305 Oral     SpO2 10/13/20 1305 94 %     Weight --      Height --      Head Circumference --      Peak Flow --      Pain Score 10/13/20 1228 0     Pain Loc --      Pain Edu? --      Excl. in ? --     Constitutional: Alert and oriented. Well appearing and in no distress. Eyes: Normal exam ENT      Head: Normocephalic and atraumatic.      Mouth/Throat: Mucous membranes are moist. Cardiovascular: Normal rate, regular rhythm Respiratory: Normal respiratory effort without tachypnea nor retractions. Breath sounds are clear Gastrointestinal: Soft and nontender. No distention.  Musculoskeletal: Nontender with normal range of motion in all extremities.  Neurologic:  Normal speech and language. No gross focal neurologic deficits  Skin:  Skin is warm, dry and intact.  Psychiatric: Mood and affect are normal. Speech and behavior are normal.   ____________________________________________   INITIAL IMPRESSION / ASSESSMENT AND PLAN / ED COURSE  Pertinent labs & imaging results that were available during my care of the patient were reviewed by me and considered in my medical decision making (see chart for details).   Patient is x-ray  from 2 days ago shows multifocal pneumonia.  Reassuringly patient's vitals are normal with a pulse ox remaining in the mid 90s at this time.  Lab work largely [place] besides mild dehydration.  We will IV hydrate, obtain a rapid COVID swab treat with Tylenol and continue to closely monitor.  Patient agreeable to plan of care.  Highly suspect coronavirus.  Patient's PCR test is positive.  Overall patient continues to appear well with reassuring vitals.  We will discharge with supportive care.  Given the patient's age and comorbidities I did refer her to the monoclonal antibody infusion clinic.  I discussed return precautions as well as supportive care at home.  Patient agreeable to plan of care.  URSULA DERMODY was evaluated in Emergency Department on 10/13/2020 for the symptoms described in the history of present illness. She was evaluated in the context of the global COVID-19 pandemic, which necessitated consideration that the patient might be at risk for infection  with the SARS-CoV-2 virus that causes COVID-19. Institutional protocols and algorithms that pertain to the evaluation of patients at risk for COVID-19 are in a state of rapid change based on information released by regulatory bodies including the CDC and federal and state organizations. These policies and algorithms were followed during the patient's care in the ED.  ____________________________________________   FINAL CLINICAL IMPRESSION(S) / ED DIAGNOSES  COVID-19   Harvest Dark, MD 10/13/20 [phone]

## 2020-10-13 NOTE — Telephone Encounter (Signed)
Patient schedule today for virtual office visit. Her CXR from ED yesterday shows multilobar pneumonia. Her blood work shows significant abnormalities due to the pneumonia. She needs to be treated in the hospital, needs blood gases, in depth evaluations and probably admitted for antibiotics as well. My ultimate recommendation would be to return to the ED. If she will not go, she can keep the appointment but there is likely little I can do from an outpatient perspective.

## 2020-10-13 NOTE — Telephone Encounter (Addendum)
Pt was seen in ED today and dx with covid pneumonia fatigue and diarrhea. Pt is drinking fluids without difficulty and having no SOB.  Pt was discharged with cough suppressant and Zofran. Triage call was with pt's daughter Sherry Jones. Daughter asked what to do for supportive care.   . If cough remains the same or better: continue to treat with over the counter medications.  Hard candy or cough drops and drinking warm fluids. Adults can also use honey 2 tsp (10 ML) at bedtime.  . If cough is becoming worse even with the use of over the counter medications and patient is not able to sleep at night, cough becomes productive with sputum that maybe yellow or green in color, contact PCP. Marland Kitchen develops severe vomiting (more than 6 times a day and or >8 hours) and/or severe abdominal pain advise patient If appetite becomes worse:   encourage patient to drink fluids as tolerated, work their way up to bland solid food such as crackers, pretzels, soup, bread or applesauce and boiled starches.  . If patient is unable to tolerate any foods or liquids, notify PCP. Marland Kitchen  If patient to call 911 and seek treatment in ED . If diarrhea remains the same:  encourage patient to drink oral fluids and bland foods.  . Avoid alcohol, spicy foods, caffeine or fatty foods that could make diarrhea worse.  . Continue to monitor for signs of dehydration (increased thirst decreased urine output, yellow urine, dry skin, headache or dizziness).  Advise patient to try OTC medication (Imodium, kaopectate, Pepto-Bismol) as per manufacturer's instructions . If worsening diarrhea occurs and becomes severe (6-7 bowel movements a day): notify PCP  . If diarrhea last greater than 7 days: notify PCP . If signs of dehydration occur (increased thirst, decreased urine output, yellow urine, dry skin, headache or dizziness) advise patient to call 911 and seek treatment in the ED  If weakness occurs:If patient has worsening weakness with inability to  stand or if patient has to hold on to something to get balance, advise patient to call 911 and seek treatment in ED . Shortness of breath is the same:   continue to monitor at home  . If symptoms become severe, i.e. shortness of breath at rest, gasping for air, wheezing, call 911 and seek treatment in the ED MyChart sign up link sent to daughters e mail. Daughter verbalized understanding.

## 2020-10-13 NOTE — Telephone Encounter (Signed)
Called patients daughter Dory Larsen) and she was advised. Lorie states that she will call the patient and see if patient would like to be send out to hospital via ambulance instead of going via car. She states that patient didn't want to wait yesterday,10/12/2020 in the ER any longer that's the reason they left. Lorie states she will call the office back and let us know if patient does go to the ER. Just a [place]

## 2020-10-13 NOTE — ED Triage Notes (Addendum)
Pt comes via EMS from home dx with double pneumonia.  Pt seen here in ED and left because of wait. Pt states cough. Pt denies any CP or SOB.   Pt's MD called and told her to come back to ED.

## 2020-10-14 ENCOUNTER — Telehealth: Payer: Self-pay | Admitting: Family

## 2020-10-14 ENCOUNTER — Telehealth: Payer: Self-pay | Admitting: Physician Assistant

## 2020-10-14 DIAGNOSIS — J189 Pneumonia, unspecified organism: Secondary | ICD-10-CM

## 2020-10-14 MED ORDER — AMOXICILLIN-POT CLAVULANATE 875-125 MG PO TABS: 1.0000 | ORAL_TABLET | Freq: Two times a day (BID) | ORAL | 0 refills | Status: DC

## 2020-10-14 NOTE — Telephone Encounter (Signed)
I will place a referral to the [facility] pharmacist for medication assistance with cost.  Also how is she feeling today? I know she got fluids at the hospital.

## 2020-10-14 NOTE — Telephone Encounter (Signed)
Per daughter patient not feeling well but went to pick up Augmentin.

## 2020-10-14 NOTE — Telephone Encounter (Signed)
Pt's daughter called asking for an antibiotic for the patient, states that insurance is active. Also reports that she is getting her breakfast right now. She spoke with a "special care nurse after hours yesterday" about home care solutions. Diuretics, how to get her energy/fluids back up, breathing treatments etc.  [id number]  [address] [place] [address]  Phone: [phone] Fax: [phone]    Requesting a call back once Rx is submitted please advise   Best contact: [phone] Cecille Rubin [place])

## 2020-10-14 NOTE — Telephone Encounter (Signed)
Lori advised.

## 2020-10-14 NOTE — Telephone Encounter (Signed)
Copied from [place] [phone]. Topic: General - Other >> Oct 14, 2020  2:02 PM Alanda Slim E wrote: Reason for CRM: Pt was discharged from the hospital and has covid and double nuemonia /pt was suppose to get an infusion but she was advised it was too late and she doesn't qualify / daughter wants to know if an antibiotic can be called in to help and stated pt is not feeling well at all /they tried reaching out to a pharmacist to assist/ please advise Cecille Rubin  asap

## 2020-10-14 NOTE — Telephone Encounter (Signed)
Augmentin sent in for them

## 2020-10-14 NOTE — Telephone Encounter (Signed)
Called to discuss with patient about COVID-19 symptoms and the use of one of the available treatments for those with mild to moderate Covid symptoms and at a high risk of hospitalization.  Pt appears to qualify for outpatient treatment due to co-morbid conditions and/or a member of an at-risk group in accordance with the FDA Emergency Use Authorization.    Symptom onset: late December Positive test: 10/13/20 Vaccinated: No Booster? No Immunocompromised? No Qualifiers: Age, DM2 (diet controlled)  Spoke with daughter, Romie Minus, per DPR. Due to symptom onset >7 days she does not qualify for Monoclonal Antibody infusion at this time.   Per daughter Miss Kettering's symptoms date back to late December with loss of appetite. Early January (approx 10/05/20) she started having increased fatigue. She was seen by EMS 10/08/2020 but was not advised for ED evaluation. Per daughter's report her vital signs were "good". Losing appetite dating back in December. She was seen in ED 10/12/2020 due to weakness, fatigue but left without seeing provider. Preliminary workup with CXR showing multifocal pneumonia. Left without seeing provider. Advised to return to ED by her PCP 10/13/20. COVID PCR was positive. Vitals were reassuring and supportive care recommended. She was referred for MAB, but as above does not qualify. She was discharged with Rx for Tessalon.   Lengthy conversation with daughter. Tells me her mother's symptoms are overall improving though still with diminished appetite. Notes mild cough over last 1-2 days. Encouraged her to call patient's primary care and I will forward this note to PCP as well. Also provided phone number for post-COVID care clinic.    Loel Dubonnet

## 2020-10-15 ENCOUNTER — Ambulatory Visit: Payer: Self-pay | Admitting: Physician Assistant

## 2020-10-15 NOTE — Telephone Encounter (Signed)
Summary: please confirm covid results   Pt daughter Sherry Jones is calling and her mother is quaranting and per daughter she saw on mychart that her mother was negative and not positive. Pt daughter would like confimation       Reason for Disposition . Health Information question, no triage required and triager able to answer question  Answer Assessment - Initial Assessment Questions 1. REASON FOR CALL or QUESTION: "What is your reason for calling today?" or "How can I best help you?" or "What question do you have that I can help answer?"     Pt's daughter needed clarification of test results reported in MyChart. My Chart had both positive and negative COVID test results reported.  Protocols used: INFORMATION ONLY CALL - NO TRIAGE-A-AH   My chart was showing a negative "rapid" COVID test and a positive PRC  COVID test. Daughter needed clarification as to how and why 2 tests were preformed and provided different results.  Also discussed quarantining and safe practice guidelines.

## 2020-10-16 ENCOUNTER — Ambulatory Visit: Payer: Medicare Other

## 2020-10-16 DIAGNOSIS — M810 Age-related osteoporosis without current pathological fracture: Secondary | ICD-10-CM

## 2020-10-16 DIAGNOSIS — E7800 Pure hypercholesterolemia, unspecified: Secondary | ICD-10-CM

## 2020-10-16 NOTE — Chronic Care Management (AMB) (Signed)
Chronic Care Management Pharmacy  Name: Sherry Jones  MRN: [id number] DOB: 1941-09-23   Chief Complaint/ HPI  Sherry Jones,  80 y.o. , female presents for her Initial CCM visit with the clinical pharmacist via telephone.  PCP : Mar Daring, PA-C Patient Care Team: [name] as PCP - General (Family Medicine) [place], [facility] (Optometry) Germaine Pomfret, [facility] (Pharmacist)  Patient's chronic conditions include: Hyperlipidemia, Diabetes, Hypothyroidism, Osteoporosis, Osteoarthritis, Allergic Rhinitis and Barrett esophagus, and history of DVT    Office Visits: 04/20/20: Patient presented to Fenton Malling, PA-C for follow-up. A1c stable at 6.0%. No medication changes made.  04/20/20: Patient presented to [facility], LPN for AWV.   Consult Visit: 10/13/20: Patient presented to ED for Covid-19 infection.  07/30/20: Patient presented to ED for shoulder pain. Patient with thrombus.   Objective: Allergies  Allergen Reactions  . Cefadroxil   . Cefuroxime   . Cefuroxime Axetil     Other reaction(s): Unknown Other reaction(s): NAUSEA  . Cephalosporins Other (See Comments)  . Ciprofloxacin Other (See Comments)  . Codeine Swelling  . Hydrocodone-Acetaminophen Other (See Comments)    Other reaction(s): Other (See Comments) Other reaction(s): Unknown Other reaction(s): Other (See Comments)  . Levofloxacin Other (See Comments)  . Meloxicam   . Oxycodone     Other reaction(s): Syncope  . Tramadol     Other reaction(s): Unknown  . Gabapentin Nausea And Vomiting  . Macrolides And Ketolides     Other reaction(s): Other (See Comments) Other Reaction: Intolerance Other reaction(s): Other (See Comments) Other Reaction: Intolerance Other reaction(s): Other (See Comments) Other Reaction: Intolerance Other reaction(s): Other (See Comments) Other Reaction: Intolerance  . Methocarbamol Rash  . Nitrofuran Derivatives Other (See Comments)     Other Reaction: Intolerance  . Nitrofurantoin     Other reaction(s): Other (See Comments) Other Reaction: Intolerance Other reaction(s): Other (See Comments) Other Reaction: Intolerance    Medications: Outpatient Encounter Medications as of 10/16/2020  Medication Sig  . acetaminophen (TYLENOL) 500 MG tablet Take 1,000 mg by mouth every 6 (six) hours as needed.  Marland Kitchen amoxicillin-clavulanate (AUGMENTIN) 875-125 MG tablet Take 1 tablet by mouth 2 (two) times daily.  . benzonatate (TESSALON PERLES) 100 MG capsule Take 1 capsule (100 mg total) by mouth every 6 (six) hours as needed for cough.  . [facility] CALCIUM 600 MG tablet Take 1 tablet by mouth 2 (two) times daily with a meal.  . levothyroxine (SYNTHROID) 75 MCG tablet TAKE 1 TABLET BY MOUTH EVERY DAY  . simvastatin (ZOCOR) 10 MG tablet TAKE 1 TABLET BY MOUTH EVERYDAY AT BEDTIME  . Vitamin D, Ergocalciferol, (DRISDOL) 1.25 MG (50000 UNIT) CAPS capsule TAKE 1 CAPSULE (50,000 UNITS TOTAL) BY MOUTH EVERY 30 (THIRTY) DAYS.  Marland Kitchen XARELTO 20 MG TABS tablet TAKE 1 TABLET (20 MG TOTAL) BY MOUTH DAILY WITH SUPPER.  Marland Kitchen ondansetron (ZOFRAN) 4 MG tablet Take 1 tablet (4 mg total) by mouth daily as needed for nausea or vomiting. (Patient not taking: Reported on 10/16/2020)  . [DISCONTINUED] HYDROcodone-acetaminophen (NORCO/VICODIN) 5-325 MG tablet Take 1 tablet by mouth every 6 (six) hours as needed for moderate pain or severe pain.   No facility-administered encounter medications on file as of 10/16/2020.    Wt Readings from Last 3 Encounters:  10/12/20 115 lb (52.2 kg)  07/30/20 116 lb (52.6 kg)  05/12/20 116 lb (52.6 kg)    Lab Results  Component Value Date   CREATININE 0.71 10/13/2020   BUN  11 10/13/2020   GFRNONAA >60 10/13/2020   GFRAA 88 04/27/2020   NA 132 (L) 10/13/2020   K 3.3 (L) 10/13/2020   CALCIUM 8.1 (L) 10/13/2020   CO2 21 (L) 10/13/2020     Current Diagnosis/Assessment:  SDOH Interventions   Flowsheet Row Most Recent Value  SDOH  Interventions   Financial Strain Interventions Intervention Not Indicated  Transportation Interventions Intervention Not Indicated      Goals Addressed            This Visit's Progress   . Chronic Care Management       CARE PLAN ENTRY (see longitudinal plan of care for additional care plan information)  Current Barriers:  . Chronic Disease Management support, education, and care coordination needs related to Hyperlipidemia, Diabetes, Hypothyroidism, Osteoporosis, Osteoarthritis, Allergic Rhinitis and Barrett esophagus, and history of DVT    Hyperlipidemia Lab Results  Component Value Date/Time   LDLCALC 93 04/27/2020 10:26 AM   . Pharmacist Clinical Goal(s): o Over the next 90 days, patient will work with PharmD and providers to maintain LDL goal < 100 . Current regimen:  o Simvastatin 10 mg daily  . Interventions: o Discussed low cholesterol diet and exercising as tolerated extensively o Will initiate cholesterol monitoring plan   Diabetes Lab Results  Component Value Date/Time   HGBA1C 6.0 (H) 04/27/2020 10:26 AM   HGBA1C 5.8 (H) 04/18/2019 09:11 AM   . Pharmacist Clinical Goal(s): o Over the next 90 days, patient will work with PharmD and providers to maintain A1c goal <7% . Current regimen:  o None . Interventions: o Discussed carbohydrate counting and exercising as tolerated extensively o Will initiate blood sugar monitoring plan  . Patient self care activities - Over the next 90 days, patient will: o Contact provider with any episodes of hypoglycemia  Medication management . Pharmacist Clinical Goal(s): o Over the next 90 days, patient will work with PharmD and providers to maintain optimal medication adherence . [facility] . Interventions o Comprehensive medication review performed. o Continue current medication management strategy . Patient self care activities - Over the next 90 days, patient will: o Take medications as prescribed o Report  any questions or concerns to PharmD and/or provider(s)      Hyperlipidemia   LDL goal < 100  Last lipids Lab Results  Component Value Date   CHOL 181 04/27/2020   HDL 70 04/27/2020   LDLCALC 93 04/27/2020   TRIG 99 04/27/2020   CHOLHDL 2.6 04/27/2020   Hepatic Function Latest Ref Rng & Units 07/31/2020 04/27/2020 04/18/2019  Total Protein 6.5 - 8.1 g/dL 7.4 7.1 7.0  Albumin 3.5 - 5.0 g/dL 4.2 4.3 4.5  AST 15 - 41 U/L _0 ALT 0 - 44 U/L _1 Alk Phosphatase 38 - 126 U/L 53 59 54  Total Bilirubin 0.3 - 1.2 mg/dL 0.7 0.6 0.5     The 10-year ASCVD risk score Mikey Bussing DC Jr., et al., 2013) is: 39%   Values used to calculate the score:     Age: 45 years     Sex: Female     Is Non-Hispanic African American: No     Diabetic: Yes     Tobacco smoker: No     Systolic Blood Pressure: 962 mmHg     Is BP treated: No     HDL Cholesterol: 70 mg/dL     Total Cholesterol: 181 mg/dL   Patient has failed these meds in past: NA  Patient is currently controlled on the following medications:  . Simvastatin 10 mg QHS   We discussed:  diet and exercise extensively. Denies myalgias.  Plan  Continue current medications  Diabetes   A1c goal <7%  Recent Relevant Labs: Lab Results  Component Value Date/Time   HGBA1C 6.0 (H) 04/27/2020 10:26 AM   HGBA1C 5.8 (H) 04/18/2019 09:11 AM    Last diabetic Eye exam: No results found for: HMDIABEYEEXA  Last diabetic Foot exam: No results found for: HMDIABFOOTEX   Checking BG: Never  Recent FBG Readings: NA Recent pre-meal BG readings: NA Recent 2hr PP BG readings:  NA Recent HS BG readings: NA  Patient has failed these meds in past: NA Patient is currently controlled on the following medications: . None  We discussed: diet and exercise extensively  Plan  Continue control with diet and exercise  Hypothyroidism   Lab Results  Component Value Date/Time   TSH 1.370 04/27/2020 10:26 AM   TSH 2.000 04/18/2019 09:11 AM     Patient has failed these meds in past: NA Patient is currently controlled on the following medications:  . Levothyroxine 75 mcg daily   We discussed:  Separates from food/medication appropriately   Plan  Continue current medications  Osteopenia / Osteoporosis   Last DEXA Scan: 05/12/20   T-Score femoral neck: NA  T-Score total hip: NA  T-Score lumbar spine: NA  T-Score forearm radius: -2.8  10-year probability of major osteoporotic fracture: NA  10-year probability of hip fracture: NA  Vit D, 25-Hydroxy  Date Value Ref Range Status  04/27/2020 38.1 30.0 - 100.0 ng/mL Final    Comment:    Vitamin D deficiency has been defined by the Institute of Medicine and an Endocrine Society practice guideline as a level of serum 25-OH vitamin D less than 20 ng/mL (1,2). The Endocrine Society went on to further define vitamin D insufficiency as a level between 21 and 29 ng/mL (2). 1. IOM (Institute of Medicine). 2010. Dietary reference    intakes for calcium and D. [place]: The    [employer]. 2. Holick MF, Binkley [place], Bischoff-Ferrari HA, et al.    Evaluation, treatment, and prevention of vitamin D    deficiency: an Endocrine Society clinical practice    guideline. JCEM. 2011 Jul; 96(7):1911-30.     Patient has failed these meds in past: NA Patient is currently controlled on the following medications:   . Calcium 600 mg twice daily  . Ergocalciferol 50,000 units every 30 days   We discussed:  Recommend weight-bearing and muscle strengthening exercises for building and maintaining bone density.  Plan  Continue current medications  History of DVT    Patient has failed these meds in past: NA Patient is currently controlled on the following medications:  . Xarelto 20 mg daily   We discussed:  Went into the Donut hole in October. Denies unusual bruising/bleeding.  Plan  Continue current medications  Will plan for PAP closer to when patient will meet  4% OOP cost spending (will check in with patient in September)   Misc / OTC   . Augmentin twice daily . Benzonatate 100 mg q6hr PRN  . Hydrocodone-APAP 5-325  . Ondansetron 4 mg   Plan  Continue current medications  Vaccines   Reviewed and discussed patient's vaccination history.    Immunization History  Administered Date(s) Administered  . Influenza, High Dose Seasonal PF 09/03/2014, 08/09/2016  . Influenza,inj,Quad PF,6+ Mos 06/24/2013  . Pneumococcal Conjugate-13 05/14/2014  .  Pneumococcal Polysaccharide-23 05/27/2011  . Td 10/03/1997  . Tdap 05/27/2011, 11/21/2015, 12/06/2019  . Zoster 02/24/2012   Medication Management   [facility]  [id number] [place] [place]Phone: [phone]: [phone] Uses pill box? Yes  We discussed: Current pharmacy is preferred with insurance plan and patient is satisfied with pharmacy services  Plan  Continue current medication management strategy  Follow up: 12 month phone visit  [phone]

## 2020-10-22 NOTE — Patient Instructions (Signed)
Visit Information It was great speaking with you today!  Please let me know if you have any questions about our visit. Goals Addressed            This Visit's Progress   . Chronic Care Management       CARE PLAN ENTRY (see longitudinal plan of care for additional care plan information)  Current Barriers:  . Chronic Disease Management support, education, and care coordination needs related to Hyperlipidemia, Diabetes, Hypothyroidism, Osteoporosis, Osteoarthritis, Allergic Rhinitis and Barrett esophagus, and history of DVT    Hyperlipidemia Lab Results  Component Value Date/Time   LDLCALC 93 04/27/2020 10:26 AM   . Pharmacist Clinical Goal(s): o Over the next 90 days, patient will work with PharmD and providers to maintain LDL goal < 100 . Current regimen:  o Simvastatin 10 mg daily  . Interventions: o Discussed low cholesterol diet and exercising as tolerated extensively o Will initiate cholesterol monitoring plan   Diabetes Lab Results  Component Value Date/Time   HGBA1C 6.0 (H) 04/27/2020 10:26 AM   HGBA1C 5.8 (H) 04/18/2019 09:11 AM   . Pharmacist Clinical Goal(s): o Over the next 90 days, patient will work with PharmD and providers to maintain A1c goal <7% . Current regimen:  o None . Interventions: o Discussed carbohydrate counting and exercising as tolerated extensively o Will initiate blood sugar monitoring plan  . Patient self care activities - Over the next 90 days, patient will: o Contact provider with any episodes of hypoglycemia  Medication management . Pharmacist Clinical Goal(s): o Over the next 90 days, patient will work with PharmD and providers to maintain optimal medication adherence . [facility] . Interventions o Comprehensive medication review performed. o Continue current medication management strategy . Patient self care activities - Over the next 90 days, patient will: o Take medications as prescribed o Report any questions or  concerns to PharmD and/or provider(s)       The patient verbalized understanding of instructions, educational materials, and care plan provided today and declined offer to receive copy of patient instructions, educational materials, and care plan.   Telephone follow up appointment with pharmacy team member scheduled for: 10/11/21 at 11:00 AM  [place] [phone]

## 2020-10-27 ENCOUNTER — Telehealth: Payer: Medicare Other

## 2020-11-16 DIAGNOSIS — M7612 Psoas tendinitis, left hip: Secondary | ICD-10-CM | POA: Diagnosis not present

## 2020-11-16 DIAGNOSIS — Q6589 Other specified congenital deformities of hip: Secondary | ICD-10-CM | POA: Diagnosis not present

## 2020-11-16 DIAGNOSIS — M25552 Pain in left hip: Secondary | ICD-10-CM | POA: Diagnosis not present

## 2020-12-01 ENCOUNTER — Ambulatory Visit
Admission: RE | Admit: 2020-12-01 | Discharge: 2020-12-01 | Disposition: A | Discharge status: Home / Self Care | Payer: Medicare Other | Source: Ambulatory Visit | Attending: Physician Assistant | Admitting: Physician Assistant

## 2020-12-01 ENCOUNTER — Ambulatory Visit (INDEPENDENT_AMBULATORY_CARE_PROVIDER_SITE_OTHER): Payer: Medicare Other | Admitting: Physician Assistant

## 2020-12-01 ENCOUNTER — Encounter: Payer: Self-pay | Admitting: Physician Assistant

## 2020-12-01 ENCOUNTER — Other Ambulatory Visit: Payer: Self-pay

## 2020-12-01 VITALS — BP 126/70 | HR 106 | Temp 97.9°F | Wt 103.0 lb

## 2020-12-01 DIAGNOSIS — J189 Pneumonia, unspecified organism: Secondary | ICD-10-CM | POA: Diagnosis not present

## 2020-12-01 DIAGNOSIS — R918 Other nonspecific abnormal finding of lung field: Secondary | ICD-10-CM | POA: Diagnosis not present

## 2020-12-01 DIAGNOSIS — I700 Atherosclerosis of aorta: Secondary | ICD-10-CM | POA: Diagnosis not present

## 2020-12-01 NOTE — Patient Instructions (Signed)
How to Use an Incentive Spirometer An incentive spirometer is a tool that measures how well you are filling your lungs with each breath. Learning to take long, deep breaths using this tool can help you keep your lungs clear and active. This may help to reverse or lessen your chance of developing breathing (pulmonary) problems, especially infection. You may be asked to use a spirometer:  After a surgery.  If you have a lung problem or a history of smoking.  After a long period of time when you have been unable to move or be active. If the spirometer includes an indicator to show the highest number that you have reached, your health care provider or respiratory therapist will help you set a goal. Keep a log of your progress as told by your health care provider. What are the risks?  Breathing too quickly may cause dizziness or cause you to pass out. Take your time so you do not get dizzy or light-headed.  If you are in pain, you may need to take pain medicine before doing incentive spirometry. It is harder to take a deep breath if you are having pain. How to use your incentive spirometer 1. Sit up on the edge of your bed or on a chair. 2. Hold the incentive spirometer so that it is in an upright position. 3. Before you use the spirometer, breathe out normally. 4. Place the mouthpiece in your mouth. Make sure your lips are closed tightly around it. 5. Breathe in slowly and as deeply as you can through your mouth, causing the piston or the ball to rise toward the top of the chamber. 6. Hold your breath for 3-5 seconds, or for as long as possible. ? If the spirometer includes a coach indicator, use this to guide you in breathing. Slow down your breathing if the indicator goes above the marked areas. 7. Remove the mouthpiece from your mouth and breathe out normally. The piston or ball will return to the bottom of the chamber. 8. Rest for a few seconds, then repeat the steps 10 or more times. ? Take  your time and take a few normal breaths between deep breaths so that you do not get dizzy or light-headed. ? Do this every 1-2 hours when you are awake. 9. If the spirometer includes a goal marker to show the highest number you have reached (best effort), use this as a goal to work toward during each repetition. 10. After each set of 10 deep breaths, cough a few times. This will help to make sure that your lungs are clear. ? If you have an incision on your chest or abdomen from surgery, place a pillow or a rolled-up towel firmly against the incision when you cough. This can help to reduce pain while taking deep breaths and coughing.   General tips  When you are able to get out of bed: ? Walk around often. ? Continue to take deep breaths and cough in order to clear your lungs.  Keep using the incentive spirometer until your health care provider says it is okay to stop using it. If you have been in the hospital, you may be told to keep using the spirometer at home. Contact a health care provider if:  You are having difficulty using the spirometer.  You have trouble using the spirometer as often as instructed.  Your pain medicine is not giving enough relief for you to use the spirometer as told.  You have a fever. Get  help right away if:  You develop shortness of breath.  You develop a cough with bloody mucus from the lungs.  You have fluid or blood coming from an incision site after you cough. Summary  An incentive spirometer is a tool that can help you learn to take long, deep breaths to keep your lungs clear and active.  You may be asked to use a spirometer after a surgery, if you have a lung problem or a history of smoking, or if you have been inactive for a long period of time.  Use your incentive spirometer as instructed every 1-2 hours while you are awake.  If you have an incision on your chest or abdomen, place a pillow or a rolled-up towel firmly against your incision when  you cough. This will help to reduce pain.  Get help right away if you have shortness of breath, you cough up bloody mucus, or blood comes from your incision when you cough. This information is not intended to replace advice given to you by your health care provider. Make sure you discuss any questions you have with your health care provider. Document Revised: 12/09/2019 Document Reviewed: 12/09/2019 Elsevier Patient Education  [place].

## 2020-12-01 NOTE — Progress Notes (Signed)
Established patient visit   Patient: Sherry Jones   DOB: [birth year]   80 y.o. Female  MRN: [id number] Visit Date: 12/01/2020  Today's healthcare provider: Mar Daring, PA-C   No chief complaint on file.  Subjective    HPI  Follow up for pneumonia  The patient was last seen for this 6 weeks ago. Changes made at last visit include CXR and labs. Advised to continue supportive care.  She reports excellent compliance with treatment. She feels that condition is Improved but not back to baseline.  Pt reports she is still coughing an feels short of breath.  She is not having side effects.   -----------------------------------------------------------------------------------------    Patient Active Problem List   Diagnosis Date Noted  . Asymptomatic varicose veins of left lower extremity 09/09/2019  . Acute deep vein thrombosis (DVT) of femoral vein of right lower extremity ([place]) 07/30/2019  . Age-related osteoporosis without current pathological fracture 04/19/2018  . H/O non-Hodgkin's lymphoma 01/23/2017  . History of deep vein thrombosis (DVT) of lower extremity 01/23/2017  . Back pain 08/04/2016  . Diabetes ([place]) 08/04/2016  . Gonalgia 08/25/2015  . Contact dermatitis 08/11/2015  . Atypical nevus 05/29/2015  . Shoulder pain, left 05/29/2015  . Hypercholesteremia 05/26/2015  . 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 H/O ear disorder 06/04/2014  . History of uterine cancer 06/04/2014  . Avitaminosis D 11/20/2009  . Acquired hypothyroidism 11/26/2007  . Barrett esophagus 11/23/2007  . Narrowing of intervertebral disc space 11/23/2007  . Allergic rhinitis 11/22/2007  . Arthritis, degenerative 11/22/2007  . Disease of airway 11/22/2007   Past Medical History:  Diagnosis Date  . Cancer ([place])    uterine  . Diabetes mellitus without complication ([place])   . Hypercholesterolemia   . Thyroid disease     Social History   Tobacco Use  . Smoking status: Former Smoker    Years: 36.00    Quit date: 01/02/1996    Years since quitting: 24.9  . Smokeless tobacco: Never Used  Vaping Use  . Vaping Use: Never used  Substance Use Topics  . Alcohol use: No  . Drug use: No   Allergies  Allergen Reactions  . Cefadroxil   . Cefuroxime   . Cefuroxime Axetil     Other reaction(s): Unknown Other reaction(s): NAUSEA  . Cephalosporins Other (See Comments)  . Ciprofloxacin Other (See Comments)  . Codeine Swelling  . Hydrocodone-Acetaminophen Other (See Comments)    Other reaction(s): Other (See Comments) Other reaction(s): Unknown Other reaction(s): Other (See Comments)  . Levofloxacin Other (See Comments)  . Meloxicam   . Oxycodone     Other reaction(s): Syncope  . Tramadol     Other reaction(s): Unknown  . Gabapentin Nausea And Vomiting  . Macrolides And Ketolides     Other reaction(s): Other (See Comments) Other Reaction: Intolerance Other reaction(s): Other (See Comments) Other Reaction: Intolerance Other reaction(s): Other (See Comments) Other Reaction: Intolerance Other reaction(s): Other (See Comments) Other Reaction: Intolerance  . Methocarbamol Rash  . Nitrofuran Derivatives Other (See Comments)    Other Reaction: Intolerance  . Nitrofurantoin     Other reaction(s): Other (See Comments) Other Reaction: Intolerance Other reaction(s): Other (See Comments) Other Reaction: Intolerance     Medications: Outpatient Medications Prior to Visit  Medication Sig  . acetaminophen (TYLENOL) 500 MG tablet Take 1,000 mg by mouth every 6 (six) hours as needed.  Marland Kitchen amoxicillin-clavulanate (AUGMENTIN) 875-125 MG  tablet Take 1 tablet by mouth 2 (two) times daily.  . benzonatate (TESSALON PERLES) 100 MG capsule Take 1 capsule (100 mg total) by mouth every 6 (six) hours as needed for cough.  . [facility] CALCIUM 600 MG tablet Take 1 tablet by mouth 2 (two) times daily with a meal.  .  levothyroxine (SYNTHROID) 75 MCG tablet TAKE 1 TABLET BY MOUTH EVERY DAY  . ondansetron (ZOFRAN) 4 MG tablet Take 1 tablet (4 mg total) by mouth daily as needed for nausea or vomiting. (Patient not taking: Reported on 10/16/2020)  . simvastatin (ZOCOR) 10 MG tablet TAKE 1 TABLET BY MOUTH EVERYDAY AT BEDTIME  . Vitamin D, Ergocalciferol, (DRISDOL) 1.25 MG (50000 UNIT) CAPS capsule TAKE 1 CAPSULE (50,000 UNITS TOTAL) BY MOUTH EVERY 30 (THIRTY) DAYS.  Marland Kitchen XARELTO 20 MG TABS tablet TAKE 1 TABLET (20 MG TOTAL) BY MOUTH DAILY WITH SUPPER.   No facility-administered medications prior to visit.    Review of Systems  Constitutional: Negative.   Respiratory: Positive for cough, chest tightness, shortness of breath and wheezing. Negative for apnea, choking and stridor.   Cardiovascular: Negative.   Gastrointestinal: Negative.   Musculoskeletal: Positive for arthralgias (Left hip pain.  Followed by Orthopedics). Negative for back pain, gait problem, joint swelling, myalgias, neck pain and neck stiffness.  Neurological: Positive for headaches. Negative for dizziness and light-headedness.        Objective    BP 126/70 (BP Location: Left Arm, Patient Position: Sitting, Cuff Size: Large)   Pulse (!) 106   Temp 97.9 F (36.6 C) (Oral)   Wt 103 lb (46.7 kg)   BMI 26.78 kg/m    Physical Exam Vitals reviewed.  Constitutional:      General: She is not in acute distress.    Appearance: Normal appearance. She is well-developed and well-nourished. She is not ill-appearing or diaphoretic.  Neck:     Thyroid: No thyromegaly.     Vascular: No JVD.     Trachea: No tracheal deviation.  Cardiovascular:     Rate and Rhythm: Normal rate and regular rhythm.     Pulses: Normal pulses.     Heart sounds: Normal heart sounds. No murmur heard. No friction rub. No gallop.   Pulmonary:     Effort: Pulmonary effort is normal. No respiratory distress.     Breath sounds: Wheezing (some inspiratory wheezing noted at  the very end of inspiration) present. No rales.  Musculoskeletal:     Cervical back: Normal range of motion and neck supple.  Lymphadenopathy:     Cervical: No cervical adenopathy.  Neurological:     General: No focal deficit present.     Mental Status: She is alert.      No results found for any visits on 12/01/20.  Assessment & Plan     1. Multifocal pneumonia Suspected patient had covid pneumonia in January 2022. Still having some SOB, cough and fatigue. Will check labs as below and f/u pending results. Will check CXR as below to see if clearing. Rx given for incentive spirometer with instructions on how to use. These breathing exercises may help with SOB. I will f/u pending results. May require f/u in 3-4 weeks pending results.  - CBC w/Diff/Platelet - Basic Metabolic Panel (BMET) - DG Chest 2 View; Future - SARS-CoV-2 Semi-Quantitative Total Antibody, Spike   No follow-ups on file.      Reynolds Bowl, PA-C, have reviewed all documentation for this visit. The documentation on 12/01/20 for  the exam, diagnosis, procedures, and orders are all accurate and complete.   [name]  [facility] [phone] (phone) [phone] (fax)  [place]

## 2020-12-02 LAB — BASIC METABOLIC PANEL
BUN/Creatinine Ratio: 16 (ref 12–28)
BUN: 12 mg/dL (ref 8–27)
CO2: 23 mmol/L (ref 20–29)
Calcium: 8.5 mg/dL — ABNORMAL LOW (ref 8.7–10.3)
Chloride: 94 mmol/L — ABNORMAL LOW (ref 96–106)
Creatinine, Ser: 0.73 mg/dL (ref 0.57–1.00)
Glucose: 102 mg/dL — ABNORMAL HIGH (ref 65–99)
Potassium: 4.3 mmol/L (ref 3.5–5.2)
Sodium: 135 mmol/L (ref 134–144)
eGFR: 84 mL/min/{1.73_m2} (ref >59)

## 2020-12-02 LAB — CBC WITH DIFFERENTIAL/PLATELET
Basophils Absolute: 0.1 10*3/uL (ref 0.0–0.2)
Basos: 1 %
EOS (ABSOLUTE): 0.1 10*3/uL (ref 0.0–0.4)
Eos: 2 %
Hematocrit: 31.2 % — ABNORMAL LOW (ref 34.0–46.6)
Hemoglobin: 10.6 g/dL — ABNORMAL LOW (ref 11.1–15.9)
Immature Grans (Abs): 0 10*3/uL (ref 0.0–0.1)
Immature Granulocytes: 1 %
Lymphocytes Absolute: 0.9 10*3/uL (ref 0.7–3.1)
Lymphs: 12 %
MCH: 29.9 pg (ref 26.6–33.0)
MCHC: 34 g/dL (ref 31.5–35.7)
MCV: 88 fL (ref 79–97)
Monocytes Absolute: 0.5 10*3/uL (ref 0.1–0.9)
Monocytes: 7 %
Neutrophils Absolute: 5.7 10*3/uL (ref 1.4–7.0)
Neutrophils: 77 %
Platelets: 327 10*3/uL (ref 150–450)
RBC: 3.54 x10E6/uL — ABNORMAL LOW (ref 3.77–5.28)
RDW: 16.8 % — ABNORMAL HIGH (ref 11.7–15.4)
WBC: 7.3 10*3/uL (ref 3.4–10.8)

## 2020-12-02 LAB — SARS-COV-2 SEMI-QUANTITATIVE TOTAL ANTIBODY, SPIKE
SARS-CoV-2 Semi-Quant Total Ab: >2500 U/mL (ref <0.8)
SARS-CoV-2 Spike Ab Interp: POSITIVE

## 2020-12-03 ENCOUNTER — Telehealth: Payer: Self-pay

## 2020-12-03 NOTE — Telephone Encounter (Signed)
-----   Message from Mar Daring, [place] sent at 12/02/2020  3:28 PM EST ----- Hemoglobin has dropped some compared to last check. Mild anemia noted. May benefit from an iron supplement (ferrous sulfate or ferrous gluconate) 325mg  once daily. Recheck labs in 6 weeks. Other labs have been improving. Kidney function is nromal. Potassium and sodium have improved. Calcium is back to normal. You do have covid antibodies so I do believe you had covid pneumonia in January.

## 2020-12-03 NOTE — Telephone Encounter (Signed)
-----   Message from Mar Daring, [place] sent at 12/02/2020  3:25 PM EST ----- CXR is improving and states near complete resolution of previous areas of infection.

## 2020-12-03 NOTE — Telephone Encounter (Signed)
Pt advised.   Thanks,   -Azyria Osmon  

## 2020-12-03 NOTE — Telephone Encounter (Signed)
Copied from [place] [id number]. Topic: General - Other >> Dec 02, 2020  4:56 PM Mcneil, Ja-Kwan wrote: Reason for CRM: Pt called for x-ray results. Pt requests call back

## 2020-12-09 ENCOUNTER — Telehealth: Payer: Self-pay

## 2020-12-09 NOTE — Progress Notes (Signed)
Chronic Care Management Pharmacy Assistant   Name: Sherry Jones  MRN: [id number] DOB: [birth year]  Reason for Encounter:Diabetes and Hyperlipidemia  Disease State Call   Conditions to be addressed/monitored: HLD and Diabetes  Primary concerns for visit include: Hyperlipidemia and Diabetes   Recent office visits:  12/01/2020 PCP Sherry Jones started incentive spirometer 12/03/2020 PCP Sherry Jones started ferrous sulfate 325 mg once daily  Recent consult visits:  11/16/2020 Orthopedic Surgery [facility] visits:  None in previous 6 months  Medications: Outpatient Encounter Medications as of 12/09/2020  Medication Sig   acetaminophen (TYLENOL) 500 MG tablet Take 1,000 mg by mouth every 6 (six) hours as needed.   amoxicillin-clavulanate (AUGMENTIN) 875-125 MG tablet Take 1 tablet by mouth 2 (two) times daily.   benzonatate (TESSALON PERLES) 100 MG capsule Take 1 capsule (100 mg total) by mouth every 6 (six) hours as needed for cough.   [facility] CALCIUM 600 MG tablet Take 1 tablet by mouth 2 (two) times daily with a meal.   levothyroxine (SYNTHROID) 75 MCG tablet TAKE 1 TABLET BY MOUTH EVERY DAY   ondansetron (ZOFRAN) 4 MG tablet Take 1 tablet (4 mg total) by mouth daily as needed for nausea or vomiting.   simvastatin (ZOCOR) 10 MG tablet TAKE 1 TABLET BY MOUTH EVERYDAY AT BEDTIME   Vitamin D, Ergocalciferol, (DRISDOL) 1.25 MG (50000 UNIT) CAPS capsule TAKE 1 CAPSULE (50,000 UNITS TOTAL) BY MOUTH EVERY 30 (THIRTY) DAYS.   XARELTO 20 MG TABS tablet TAKE 1 TABLET (20 MG TOTAL) BY MOUTH DAILY WITH SUPPER.   No facility-administered encounter medications on file as of 12/09/2020.    Star Rating Drugs:Simvastatin 10 mg   Recent Relevant Labs: Lab Results  Component Value Date/Time   HGBA1C 6.0 (H) 04/27/2020 10:26 AM   HGBA1C 5.8 (H) 04/18/2019 09:11 AM    Kidney Function Lab Results  Component Value Date/Time   CREATININE 0.73 12/01/2020 03:51 PM    CREATININE 0.71 10/13/2020 12:37 PM   CREATININE 0.83 10/20/2014 07:51 PM   CREATININE 0.80 08/26/2013 02:36 PM   GFRNONAA >60 10/13/2020 12:37 PM   GFRNONAA >60 10/20/2014 07:51 PM   GFRNONAA >60 08/26/2013 02:36 PM   GFRAA 88 04/27/2020 10:26 AM   GFRAA >60 10/20/2014 07:51 PM   GFRAA >60 08/26/2013 02:36 PM     Current antihyperglycemic regimen:  o None ID   What recent interventions/DTPs have been made to improve glycemic control:  ? Discussed carbohydrate counting and exercising as tolerated extensively ? Will initiate blood sugar monitoring plan   Have there been any recent hospitalizations or ED visits since last visit with CPP? No  Patient denies hypoglycemic symptoms, including Pale, Sweaty, Shaky, Hungry, Nervous/irritable and Vision changes  Patient denies hyperglycemic symptoms, including blurry vision, excessive thirst, fatigue, polyuria and weakness  How often are you checking your blood sugar? once daily  What are your blood sugars ranging? Patient states her blood sugar has been ranging around 100's and never goes over 200's. o Fasting: N/A o Before meals: N/A o After meals: N/A o Bedtime: N/A  During the week, how often does your blood glucose drop below 70? Never  Are you checking your feet daily/regularly?   Patient states she has neuropathy.Patient reports she has woken up with numbness in her feet from time to time.  Adherence Review: Is the patient currently on a STATIN medication? Yes Is the patient currently on ACE/ARB medication? No Does the patient have >5 day gap between  last estimated fill dates? No   12/09/2020 Name: Sherry Jones MRN: [id number] DOB: 07/12/1941 Sherry Jones is a 80 y.o. year old female who is a primary care patient of [name].  Comprehensive medication review performed; Spoke to patient regarding cholesterol  Lipid Panel    Component Value Date/Time   CHOL 181 04/27/2020 1026   TRIG 99 04/27/2020 1026    HDL 70 04/27/2020 1026   LDLCALC 93 04/27/2020 1026    10-year ASCVD risk score: The 10-year ASCVD risk score Sherry Bussing DC Brooke Bonito., et al., 2013) is: 42%   Values used to calculate the score:     Age: 80 years     Sex: Female     Is Non-Hispanic African American: No     Diabetic: Yes     Tobacco smoker: No     Systolic Blood Pressure: 208 mmHg     Is BP treated: No     HDL Cholesterol: 70 mg/dL     Total Cholesterol: 181 mg/dL   Current antihyperlipidemic regimen:  ? Simvastatin 10 mg daily   Previous antihyperlipidemic medications tried: None ID   ASCVD risk enhancing conditions: age >80 and DM  What recent interventions/DTPs have been made by any provider to improve Cholesterol control since last CPP Visit: None ID  Any recent hospitalizations or ED visits since last visit with CPP? No  What diet changes have been made to improve Cholesterol?  o Patient states she watches her salt intake.  What exercise is being done to improve Cholesterol?  o Patient reports she tries to exercise but she is limited to it  because of her hip pain.Patient reports she has a appointment this month in [place] to discuss her hip pain.   Adherence Review: Does the patient have >5 day gap between last estimated fill dates? Yes Notified Clinical Pharmacist. Will Check estimated fill dates in April.  Reviewed chart and adherence measures. Per insurance data patient is 90-99 % adherent to Simvastatin .     [place] Pharmacist Assistant [phone]

## 2020-12-16 ENCOUNTER — Ambulatory Visit (INDEPENDENT_AMBULATORY_CARE_PROVIDER_SITE_OTHER): Payer: Medicare Other | Admitting: Physician Assistant

## 2020-12-16 DIAGNOSIS — J1282 Pneumonia due to coronavirus disease 2019: Secondary | ICD-10-CM | POA: Diagnosis not present

## 2020-12-16 DIAGNOSIS — U071 COVID-19: Secondary | ICD-10-CM

## 2020-12-16 DIAGNOSIS — R059 Cough, unspecified: Secondary | ICD-10-CM

## 2020-12-16 MED ORDER — BENZONATATE 100 MG PO CAPS
100.0000 mg | ORAL_CAPSULE | Freq: Three times a day (TID) | ORAL | 0 refills | Status: DC | PRN
Start: 2020-12-16 — End: 2021-10-12

## 2020-12-16 NOTE — Progress Notes (Signed)
MyChart Video Visit    Virtual Visit via Video Note   This visit type was conducted due to national recommendations for restrictions regarding the COVID-19 Pandemic (e.g. social distancing) in an effort to limit this patient's exposure and mitigate transmission in our community. This patient is at least at moderate risk for complications without adequate follow up. This format is felt to be most appropriate for this patient at this time. Physical exam was limited by quality of the video and audio technology used for the visit.   Patient location: Home Provider location: Office   I discussed the limitations of evaluation and management by telemedicine and the availability of in person appointments. The patient expressed understanding and agreed to proceed.  Patient: Sherry Jones   DOB: June 05, 1941   80 y.o. Female  MRN: [id number] Visit Date: 12/16/2020  Today's healthcare provider: Trinna Post, PA-C   Chief Complaint  Patient presents with  . URI   Subjective    URI  This is a new problem. The current episode started in the past 7 days. The problem has been unchanged. There has been no fever. Associated symptoms include congestion, coughing and rhinorrhea. Pertinent negatives include no wheezing. She has tried acetaminophen for the symptoms. The treatment provided no relief.    Patient has history of COVID 19 multifocal pneumonia 10/13/2020. She recently had a follow up xray on 12/02/2020 which shows resolving infiltrates. She is now having a dry cough that tends to worsen when she lies down. She does endorse PND.     Medications: Outpatient Medications Prior to Visit  Medication Sig  . acetaminophen (TYLENOL) 500 MG tablet Take 1,000 mg by mouth every 6 (six) hours as needed.  . CVS CALCIUM 600 MG tablet Take 1 tablet by mouth 2 (two) times daily with a meal.  . levothyroxine (SYNTHROID) 75 MCG tablet TAKE 1 TABLET BY MOUTH EVERY DAY  . simvastatin (ZOCOR) 10 MG tablet  TAKE 1 TABLET BY MOUTH EVERYDAY AT BEDTIME  . Vitamin D, Ergocalciferol, (DRISDOL) 1.25 MG (50000 UNIT) CAPS capsule TAKE 1 CAPSULE (50,000 UNITS TOTAL) BY MOUTH EVERY 30 (THIRTY) DAYS.  Marland Kitchen XARELTO 20 MG TABS tablet TAKE 1 TABLET (20 MG TOTAL) BY MOUTH DAILY WITH SUPPER.  . [DISCONTINUED] amoxicillin-clavulanate (AUGMENTIN) 875-125 MG tablet Take 1 tablet by mouth 2 (two) times daily. (Patient not taking: Reported on 12/16/2020)  . [DISCONTINUED] benzonatate (TESSALON PERLES) 100 MG capsule Take 1 capsule (100 mg total) by mouth every 6 (six) hours as needed for cough. (Patient not taking: Reported on 12/16/2020)  . [DISCONTINUED] ondansetron (ZOFRAN) 4 MG tablet Take 1 tablet (4 mg total) by mouth daily as needed for nausea or vomiting. (Patient not taking: Reported on 12/16/2020)   No facility-administered medications prior to visit.    Review of Systems  Constitutional: Negative for fever.  HENT: Positive for congestion, postnasal drip and rhinorrhea.   Respiratory: Positive for cough. Negative for apnea, chest tightness, shortness of breath, wheezing and stridor.        Productive cough - whitish yellow phlegm       Objective    There were no vitals taken for this visit.   Physical Exam Constitutional:      Appearance: Normal appearance.  Pulmonary:     Effort: Pulmonary effort is normal. No respiratory distress.  Neurological:     Mental Status: She is alert.  Psychiatric:        Mood and Affect: Mood normal.  Behavior: Behavior normal.        Assessment & Plan    1. Pneumonia due to COVID-19 virus  Suspect postinfectious cough and allergies. Suggest daily 2nd generation antihistamine to help with allergic rhinitis.   2. Cough  - benzonatate (TESSALON PERLES) 100 MG capsule; Take 1 capsule (100 mg total) by mouth 3 (three) times daily as needed for cough.  Dispense: 30 capsule; Refill: 0   Return if symptoms worsen or fail to improve.     I discussed the  assessment and treatment plan with the patient. The patient was provided an opportunity to ask questions and all were answered. The patient agreed with the plan and demonstrated an understanding of the instructions.   The patient was advised to call back or seek an in-person evaluation if the symptoms worsen or if the condition fails to improve as anticipated.   ITrinna Post, PA-C, have reviewed all documentation for this visit. The documentation on 12/17/20 for the exam, diagnosis, procedures, and orders are all accurate and complete.  The entirety of the information documented in the History of Present Illness, Review of Systems and Physical Exam were personally obtained by me. Portions of this information were initially documented by [facility] and reviewed by me for thoroughness and accuracy.    Paulene Floor [facility] [phone] (phone) [phone] (fax)  [place]

## 2021-01-07 ENCOUNTER — Other Ambulatory Visit: Payer: Self-pay | Admitting: Physician Assistant

## 2021-01-07 DIAGNOSIS — E7800 Pure hypercholesterolemia, unspecified: Secondary | ICD-10-CM

## 2021-01-13 ENCOUNTER — Telehealth: Payer: Self-pay

## 2021-01-13 NOTE — Progress Notes (Signed)
    Chronic Care Management Pharmacy Assistant   Name: Sherry Jones  MRN: [id number] DOB: Feb 16, 1941  Reason for Encounter:Hyperlipideima Disease State Call.   Recent office visits:  12/16/2020 PCP Office [facility]let Started benzonatate 100 mg capsule; Take 1 capsule (100 mg total) by mouth 3 (three) times daily as needed for cough  Recent consult visits:  No recent consult visit  Hospital visits:  None in previous 6 months  Medications: Outpatient Encounter Medications as of 01/13/2021  Medication Sig  . acetaminophen (TYLENOL) 500 MG tablet Take 1,000 mg by mouth every 6 (six) hours as needed.  . benzonatate (TESSALON PERLES) 100 MG capsule Take 1 capsule (100 mg total) by mouth 3 (three) times daily as needed for cough.  . CVS CALCIUM 600 MG tablet Take 1 tablet by mouth 2 (two) times daily with a meal.  . levothyroxine (SYNTHROID) 75 MCG tablet TAKE 1 TABLET BY MOUTH EVERY DAY  . simvastatin (ZOCOR) 10 MG tablet TAKE 1 TABLET BY MOUTH EVERYDAY AT BEDTIME  . Vitamin D, Ergocalciferol, (DRISDOL) 1.25 MG (50000 UNIT) CAPS capsule TAKE 1 CAPSULE (50,000 UNITS TOTAL) BY MOUTH EVERY 30 (THIRTY) DAYS.  Marland Kitchen XARELTO 20 MG TABS tablet TAKE 1 TABLET (20 MG TOTAL) BY MOUTH DAILY WITH SUPPER.   No facility-administered encounter medications on file as of 01/13/2021.   Star Rating Drugs: Simvastatin 10 mg last filled on 12/31/2020 [facility]  01/13/2021 Name: Sherry Jones MRN: [id number] DOB: 08/16/1941 [name] is a 80 y.o. year old female who is a primary care patient of [name].  Comprehensive medication review performed; Spoke to patient regarding cholesterol  Lipid Panel    Component Value Date/Time   CHOL 181 04/27/2020 1026   TRIG 99 04/27/2020 1026   HDL 70 04/27/2020 1026   LDLCALC 93 04/27/2020 1026    10-year ASCVD risk score: The 10-year ASCVD risk score [name], et al., 2013) is: 42%   Values used to calculate the  score:     Age: 25 years     Sex: Female     Is Non-Hispanic African American: No     Diabetic: Yes     Tobacco smoker: No     Systolic Blood Pressure: 817 mmHg     Is BP treated: No     HDL Cholesterol: 70 mg/dL     Total Cholesterol: 181 mg/dL  . Current antihyperlipidemic regimen:  ? Simvastatin 10 mg daily  . Previous antihyperlipidemic medications tried: None ID . ASCVD risk enhancing conditions: age >68 and DM . What recent interventions/DTPs have been made by any provider to improve Cholesterol control since last CPP Visit: None ID . Any recent hospitalizations or ED visits since last visit with CPP? No . What diet changes have been made to improve Cholesterol?  o Patient reports she has cut down on her sweets and trying to eat healthier.Patient states she was 120 lbs and now she weighs around 107.Patient states she feels great! . What exercise is being done to improve Cholesterol?  o Patient reports she exercise at home.  Adherence Review: Does the patient have >5 day gap between last estimated fill dates? No  [place] Clinical Production designer, theatre/television/film [phone]

## 2021-01-26 ENCOUNTER — Encounter: Payer: Self-pay | Admitting: Family Medicine

## 2021-01-26 ENCOUNTER — Other Ambulatory Visit: Payer: Self-pay

## 2021-01-26 ENCOUNTER — Ambulatory Visit (INDEPENDENT_AMBULATORY_CARE_PROVIDER_SITE_OTHER): Payer: Medicare Other | Admitting: Family Medicine

## 2021-01-26 VITALS — BP 132/80 | HR 94 | Temp 98.5°F | Wt 106.0 lb

## 2021-01-26 DIAGNOSIS — J301 Allergic rhinitis due to pollen: Secondary | ICD-10-CM | POA: Diagnosis not present

## 2021-01-26 MED ORDER — OXYMETAZOLINE HCL 0.05 % NA SOLN: 1.0000 | Freq: Two times a day (BID) | NASAL | 0 refills | Status: DC

## 2021-01-26 MED ORDER — LORATADINE 10 MG PO TABS: 10.0000 mg | ORAL_TABLET | Freq: Every evening | ORAL | 11 refills | Status: DC | PRN

## 2021-01-26 MED ORDER — FLUTICASONE PROPIONATE 50 MCG/ACT NA SUSP: 2.0000 | Freq: Every day | NASAL | 6 refills | Status: DC

## 2021-01-26 NOTE — Progress Notes (Signed)
Established patient visit   Patient: Sherry Jones   DOB: [birth year]   80 y.o. Female  MRN: [id number] Visit Date: 01/26/2021  Today's healthcare provider: Laurita Quint Krissi Willaims, FNP   Chief Complaint  Patient presents with  . Sinusitis   Subjective    Sinusitis This is a new problem. Episode onset: Started about a month ago. The problem has been gradually worsening since onset. There has been no fever. Associated symptoms include congestion. Pertinent negatives include no coughing, ear pain, headaches, shortness of breath, sinus pressure or sore throat. Past treatments include oral decongestants.    Has been going on for 2-3 weeks Has a history of allergies Takes sine-aid over the counter Does have moderate relief with this   Patient Active Problem List   Diagnosis Date Noted  . Asymptomatic varicose veins of left lower extremity 09/09/2019  . Acute deep vein thrombosis (DVT) of femoral vein of right lower extremity ([place]) 07/30/2019  . Age-related osteoporosis without current pathological fracture 04/19/2018  . H/O non-Hodgkin's lymphoma 01/23/2017  . History of deep vein thrombosis (DVT) of lower extremity 01/23/2017  . Back pain 08/04/2016  . Diabetes ([place]) 08/04/2016  . Gonalgia 08/25/2015  . Contact dermatitis 08/11/2015  . Atypical nevus 05/29/2015  . Shoulder pain, left 05/29/2015  . Hypercholesteremia 05/26/2015  . 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 H/O ear disorder 06/04/2014  . History of uterine cancer 06/04/2014  . Avitaminosis D 11/20/2009  . Acquired hypothyroidism 11/26/2007  . Barrett esophagus 11/23/2007  . Narrowing of intervertebral disc space 11/23/2007  . Allergic rhinitis 11/22/2007  . Arthritis, degenerative 11/22/2007  . Disease of airway 11/22/2007   Past Medical History:  Diagnosis Date  . Cancer ([place])    uterine  . Diabetes mellitus without complication ([place])   .  Hypercholesterolemia   . Thyroid disease    Social History   Tobacco Use  . Smoking status: Former Smoker    Years: 36.00    Quit date: 01/02/1996    Years since quitting: 25.0  . Smokeless tobacco: Never Used  Vaping Use  . Vaping Use: Never used  Substance Use Topics  . Alcohol use: No  . Drug use: No   Allergies  Allergen Reactions  . Cefadroxil   . Cefuroxime   . Cefuroxime Axetil     Other reaction(s): Unknown Other reaction(s): NAUSEA  . Cephalosporins Other (See Comments)  . Ciprofloxacin Other (See Comments)  . Codeine Swelling  . Hydrocodone-Acetaminophen Other (See Comments)    Other reaction(s): Other (See Comments) Other reaction(s): Unknown Other reaction(s): Other (See Comments)  . Levofloxacin Other (See Comments)  . Meloxicam   . Oxycodone     Other reaction(s): Syncope  . Tramadol     Other reaction(s): Unknown  . Gabapentin Nausea And Vomiting  . Macrolides And Ketolides     Other reaction(s): Other (See Comments) Other Reaction: Intolerance Other reaction(s): Other (See Comments) Other Reaction: Intolerance Other reaction(s): Other (See Comments) Other Reaction: Intolerance Other reaction(s): Other (See Comments) Other Reaction: Intolerance  . Methocarbamol Rash  . Nitrofuran Derivatives Other (See Comments)    Other Reaction: Intolerance  . Nitrofurantoin     Other reaction(s): Other (See Comments) Other Reaction: Intolerance Other reaction(s): Other (See Comments) Other Reaction: Intolerance     Medications: Outpatient Medications Prior to Visit  Medication Sig  . acetaminophen (TYLENOL) 500 MG tablet Take 1,000 mg by mouth every 6 (  six) hours as needed.  . benzonatate (TESSALON PERLES) 100 MG capsule Take 1 capsule (100 mg total) by mouth 3 (three) times daily as needed for cough.  . [employer] CALCIUM 600 MG tablet Take 1 tablet by mouth 2 (two) times daily with a meal.  . levothyroxine (SYNTHROID) 75 MCG tablet TAKE 1 TABLET BY MOUTH EVERY  DAY  . simvastatin (ZOCOR) 10 MG tablet TAKE 1 TABLET BY MOUTH EVERYDAY AT BEDTIME  . Vitamin D, Ergocalciferol, (DRISDOL) 1.25 MG (50000 UNIT) CAPS capsule TAKE 1 CAPSULE (50,000 UNITS TOTAL) BY MOUTH EVERY 30 (THIRTY) DAYS.  Marland Kitchen XARELTO 20 MG TABS tablet TAKE 1 TABLET (20 MG TOTAL) BY MOUTH DAILY WITH SUPPER.   No facility-administered medications prior to visit.    Review of Systems  Constitutional: Negative.   HENT: Positive for congestion, postnasal drip and rhinorrhea. Negative for drooling, ear discharge, ear pain, hearing loss, nosebleeds, sinus pressure, sinus pain, sore throat, trouble swallowing and voice change.   Eyes: Negative.   Respiratory: Negative.  Negative for cough and shortness of breath.   Neurological: Negative for dizziness, light-headedness and headaches.        Objective    BP 132/80 (BP Location: Left Arm, Patient Position: Sitting, Cuff Size: Normal)   Pulse 94   Temp 98.5 F (36.9 C) (Oral)   Wt 106 lb (48.1 kg)   BMI 27.56 kg/m    Physical Exam Constitutional:      General: She is not in acute distress.    Appearance: Normal appearance. She is not ill-appearing.  HENT:     Head: Normocephalic.  Cardiovascular:     Rate and Rhythm: Normal rate and regular rhythm.     Pulses: Normal pulses.     Heart sounds: Normal heart sounds. No murmur heard. No friction rub. No gallop.   Pulmonary:     Effort: Pulmonary effort is normal. No respiratory distress.     Breath sounds: Normal breath sounds. No stridor. No wheezing, rhonchi or rales.  Abdominal:     General: Bowel sounds are normal.     Palpations: Abdomen is soft.     Tenderness: There is no abdominal tenderness.  Musculoskeletal:     Right lower leg: No edema.     Left lower leg: No edema.  Skin:    General: Skin is warm and dry.  Neurological:     Mental Status: She is alert and oriented to person, place, and time.  Psychiatric:        Mood and Affect: Mood normal.        Behavior:  Behavior normal.       No results found for any visits on 01/26/21.  Assessment & Plan     Problem List Items Addressed This Visit      Respiratory   Allergic rhinitis - Primary   Relevant Medications   loratadine (CLARITIN) 10 MG tablet   fluticasone (FLONASE) 50 MCG/ACT nasal spray   oxymetazoline (AFRIN) 0.05 % nasal spray     Discussed importance of daily allergy regimen Discussed avoiding triggers   Return in about 6 months (around 07/28/2021).       [place], [place] [phone] (phone) [phone] (fax)  [place]

## 2021-01-26 NOTE — Patient Instructions (Signed)

## 2021-02-17 DIAGNOSIS — I82409 Acute embolism and thrombosis of unspecified deep veins of unspecified lower extremity: Secondary | ICD-10-CM

## 2021-02-17 HISTORY — DX: Acute embolism and thrombosis of unspecified deep veins of unspecified lower extremity: I82.409

## 2021-03-19 ENCOUNTER — Ambulatory Visit: Admit: 2021-03-19 | Payer: Medicare Other | Admitting: Ophthalmology

## 2021-03-19 SURGERY — BLEPHAROPLASTY
Anesthesia: Monitor Anesthesia Care | Laterality: Bilateral

## 2021-04-05 ENCOUNTER — Other Ambulatory Visit: Payer: Self-pay | Admitting: Physician Assistant

## 2021-04-05 DIAGNOSIS — E559 Vitamin D deficiency, unspecified: Secondary | ICD-10-CM

## 2021-04-10 ENCOUNTER — Other Ambulatory Visit: Payer: Self-pay

## 2021-04-10 ENCOUNTER — Emergency Department
Admission: EM | Admit: 2021-04-10 | Discharge: 2021-04-10 | Disposition: A | Discharge status: Home / Self Care | Payer: Medicare Other | Attending: Emergency Medicine | Admitting: Emergency Medicine

## 2021-04-10 ENCOUNTER — Encounter: Payer: Self-pay | Admitting: Emergency Medicine

## 2021-04-10 ENCOUNTER — Emergency Department: Payer: Medicare Other

## 2021-04-10 DIAGNOSIS — R911 Solitary pulmonary nodule: Secondary | ICD-10-CM | POA: Diagnosis not present

## 2021-04-10 DIAGNOSIS — E119 Type 2 diabetes mellitus without complications: Secondary | ICD-10-CM | POA: Diagnosis not present

## 2021-04-10 DIAGNOSIS — Y92410 Unspecified street and highway as the place of occurrence of the external cause: Secondary | ICD-10-CM | POA: Insufficient documentation

## 2021-04-10 DIAGNOSIS — S5012XA Contusion of left forearm, initial encounter: Secondary | ICD-10-CM | POA: Diagnosis not present

## 2021-04-10 DIAGNOSIS — Z79899 Other long term (current) drug therapy: Secondary | ICD-10-CM | POA: Diagnosis not present

## 2021-04-10 DIAGNOSIS — Z96652 Presence of left artificial knee joint: Secondary | ICD-10-CM | POA: Insufficient documentation

## 2021-04-10 DIAGNOSIS — S60921A Unspecified superficial injury of right hand, initial encounter: Secondary | ICD-10-CM | POA: Diagnosis present

## 2021-04-10 DIAGNOSIS — I700 Atherosclerosis of aorta: Secondary | ICD-10-CM | POA: Diagnosis not present

## 2021-04-10 DIAGNOSIS — S5011XA Contusion of right forearm, initial encounter: Secondary | ICD-10-CM | POA: Diagnosis not present

## 2021-04-10 DIAGNOSIS — R0789 Other chest pain: Secondary | ICD-10-CM | POA: Diagnosis not present

## 2021-04-10 DIAGNOSIS — Z87891 Personal history of nicotine dependence: Secondary | ICD-10-CM | POA: Diagnosis not present

## 2021-04-10 DIAGNOSIS — Z20822 Contact with and (suspected) exposure to covid-19: Secondary | ICD-10-CM | POA: Insufficient documentation

## 2021-04-10 DIAGNOSIS — Z8542 Personal history of malignant neoplasm of other parts of uterus: Secondary | ICD-10-CM | POA: Insufficient documentation

## 2021-04-10 DIAGNOSIS — Z7901 Long term (current) use of anticoagulants: Secondary | ICD-10-CM | POA: Insufficient documentation

## 2021-04-10 DIAGNOSIS — E039 Hypothyroidism, unspecified: Secondary | ICD-10-CM | POA: Diagnosis not present

## 2021-04-10 LAB — CBC WITH DIFFERENTIAL/PLATELET
Abs Immature Granulocytes: 0.02 10*3/uL (ref 0.00–0.07)
Basophils Absolute: 0.1 10*3/uL (ref 0.0–0.1)
Basophils Relative: 1 %
Eosinophils Absolute: 0.2 10*3/uL (ref 0.0–0.5)
Eosinophils Relative: 3 %
HCT: 39.5 % (ref 36.0–46.0)
Hemoglobin: 13.2 g/dL (ref 12.0–15.0)
Immature Granulocytes: 0 %
Lymphocytes Relative: 24 %
Lymphs Abs: 1.6 10*3/uL (ref 0.7–4.0)
MCH: 29.5 pg (ref 26.0–34.0)
MCHC: 33.4 g/dL (ref 30.0–36.0)
MCV: 88.4 fL (ref 80.0–100.0)
Monocytes Absolute: 0.6 10*3/uL (ref 0.1–1.0)
Monocytes Relative: 9 %
Neutro Abs: 4.1 10*3/uL (ref 1.7–7.7)
Neutrophils Relative %: 63 %
Platelets: 207 10*3/uL (ref 150–400)
RBC: 4.47 MIL/uL (ref 3.87–5.11)
RDW: 14.9 % (ref 11.5–15.5)
WBC: 6.6 10*3/uL (ref 4.0–10.5)
nRBC: 0 % (ref 0.0–0.2)

## 2021-04-10 LAB — COMPREHENSIVE METABOLIC PANEL
ALT: 17 U/L (ref 0–44)
AST: 28 U/L (ref 15–41)
Albumin: 4.5 g/dL (ref 3.5–5.0)
Alkaline Phosphatase: 51 U/L (ref 38–126)
Anion gap: 6 (ref 5–15)
BUN: 15 mg/dL (ref 8–23)
CO2: 29 mmol/L (ref 22–32)
Calcium: 9.3 mg/dL (ref 8.9–10.3)
Chloride: 103 mmol/L (ref 98–111)
Creatinine, Ser: 0.64 mg/dL (ref 0.44–1.00)
GFR, Estimated: >60 mL/min (ref >60)
Glucose, Bld: 118 mg/dL — ABNORMAL HIGH (ref 70–99)
Potassium: 4.7 mmol/L (ref 3.5–5.1)
Sodium: 138 mmol/L (ref 135–145)
Total Bilirubin: 0.8 mg/dL (ref 0.3–1.2)
Total Protein: 8.5 g/dL — ABNORMAL HIGH (ref 6.5–8.1)

## 2021-04-10 LAB — TROPONIN I (HIGH SENSITIVITY)
Troponin I (High Sensitivity): 8 ng/L (ref <18)
Troponin I (High Sensitivity): 11 ng/L (ref <18)

## 2021-04-10 LAB — RESP PANEL BY RT-PCR (FLU A&B, COVID) ARPGX2
Influenza A by PCR: NEGATIVE
Influenza B by PCR: NEGATIVE
SARS Coronavirus 2 by RT PCR: NEGATIVE

## 2021-04-10 LAB — POC SARS CORONAVIRUS 2 AG -  ED: SARS Coronavirus 2 Ag: NEGATIVE

## 2021-04-10 MED ORDER — IOHEXOL 350 MG/ML SOLN
100.0000 mL | Freq: Once | INTRAVENOUS | Status: AC | PRN
  Administered 2021-04-10: 100 mL via INTRAVENOUS
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
## 2021-04-14 ENCOUNTER — Other Ambulatory Visit: Payer: Self-pay | Admitting: Physician Assistant

## 2021-04-14 DIAGNOSIS — E559 Vitamin D deficiency, unspecified: Secondary | ICD-10-CM

## 2021-04-22 ENCOUNTER — Encounter: Payer: Medicare Other | Admitting: Physician Assistant

## 2021-04-29 ENCOUNTER — Other Ambulatory Visit: Payer: Self-pay

## 2021-04-29 ENCOUNTER — Encounter: Payer: Self-pay | Admitting: Family Medicine

## 2021-04-29 ENCOUNTER — Ambulatory Visit (INDEPENDENT_AMBULATORY_CARE_PROVIDER_SITE_OTHER): Payer: Medicare Other | Admitting: Family Medicine

## 2021-04-29 VITALS — BP 141/52 | HR 76 | Temp 98.5°F | Ht <= 58 in | Wt 108.0 lb

## 2021-04-29 DIAGNOSIS — Z0000 Encounter for general adult medical examination without abnormal findings: Secondary | ICD-10-CM | POA: Diagnosis not present

## 2021-04-29 DIAGNOSIS — E039 Hypothyroidism, unspecified: Secondary | ICD-10-CM

## 2021-04-29 DIAGNOSIS — E7800 Pure hypercholesterolemia, unspecified: Secondary | ICD-10-CM | POA: Diagnosis not present

## 2021-04-29 DIAGNOSIS — H1131 Conjunctival hemorrhage, right eye: Secondary | ICD-10-CM | POA: Diagnosis not present

## 2021-04-29 DIAGNOSIS — E559 Vitamin D deficiency, unspecified: Secondary | ICD-10-CM

## 2021-04-29 DIAGNOSIS — J302 Other seasonal allergic rhinitis: Secondary | ICD-10-CM

## 2021-04-29 DIAGNOSIS — Z86718 Personal history of other venous thrombosis and embolism: Secondary | ICD-10-CM

## 2021-04-29 DIAGNOSIS — E119 Type 2 diabetes mellitus without complications: Secondary | ICD-10-CM

## 2021-04-29 NOTE — Progress Notes (Signed)
Complete physical exam   Patient: Sherry Jones   DOB: 05-13-1941   80 y.o. Female  MRN: [id number] Visit Date: 04/29/2021  Today's healthcare provider: Vernie Murders, PA-C   No chief complaint on file.  Subjective    Sherry Jones is a 80 y.o. female who presents today for a complete physical exam.  She reports consuming a general diet. Home exercise routine includes range of motion routine. Exercise is limited by previous knee surgery. She generally feels well. She reports sleeping well. She does have additional problems to discuss today.  HPI  Wants to know where she can be seen for her eye. Woke up yesterday and it was completely red. No irritations just red. Right eye. Last eye exam was last year.  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   Lifelong prophylaxis treatment   Thyroid disease    Past Surgical History:  Procedure Laterality Date   ABDOMINAL HYSTERECTOMY     CATARACT EXTRACTION     REPLACEMENT TOTAL KNEE     left knee   ROTATOR CUFF REPAIR Left    Social History   Socioeconomic History   Marital status: Widowed    Spouse name: Not on file   Number of children: 3   Years of education: Not on file   Highest education level: Some college, no degree  Occupational History   Occupation: retired  Tobacco Use   Smoking status: Former    Years: 36.00    Types: Cigarettes    Quit date: 01/02/1996    Years since quitting: 25.3   Smokeless tobacco: Never  Vaping Use   Vaping Use: Never used  Substance and Sexual Activity   Alcohol use: No   Drug use: No   Sexual activity: Never  Other Topics Concern   Not on file  Social History Narrative   Oldest son was killed in 2004 from getting hit by a train.    Social Determinants of Health   Financial Resource Strain: Low Risk    Difficulty of Paying  Living Expenses: Not hard at all  Food Insecurity: Not on file  Transportation Needs: No Transportation Needs   Lack of Transportation (Medical): No   Lack of Transportation (Non-Medical): No  Physical Activity: Not on file  Stress: Not on file  Social Connections: Not on file  Intimate Partner Violence: Not on file   Family Status  Relation Name Status   Mother  Deceased at age 73       Dementia   Father  Deceased at age 65       pneumonia   Sister  Deceased       trauma   Sister  Deceased at age 78 month old       pneumonia   Brother  (Not Specified)   Brother  (Not Specified)   Daughter  Alive   Son  Deceased       killed after being hit by a train   Son  Alive   Neg Hx  (Not Specified)   Family History  Problem Relation Age of Onset   Alzheimer's disease Mother    Diabetes Brother    Osteoarthritis Brother    Hypertension Son    Heart attack Son        stents placed  Breast cancer Neg Hx    Allergies  Allergen Reactions   Cefadroxil    Cefuroxime    Cefuroxime Axetil     Other reaction(s): Unknown Other reaction(s): NAUSEA   Cephalosporins Other (See Comments)   Ciprofloxacin Other (See Comments)   Codeine Swelling   Hydrocodone-Acetaminophen Other (See Comments)    Other reaction(s): Other (See Comments) Other reaction(s): Unknown Other reaction(s): Other (See Comments)   Levofloxacin Other (See Comments)   Meloxicam    Oxycodone     Other reaction(s): Syncope   Tramadol     Other reaction(s): Unknown   Gabapentin Nausea And Vomiting   Macrolides And Ketolides     Other reaction(s): Other (See Comments) Other Reaction: Intolerance Other reaction(s): Other (See Comments) Other Reaction: Intolerance Other reaction(s): Other (See Comments) Other Reaction: Intolerance Other reaction(s): Other (See Comments) Other Reaction: Intolerance   Methocarbamol Rash   Nitrofuran Derivatives Other (See Comments)    Other Reaction: Intolerance    Nitrofurantoin     Other reaction(s): Other (See Comments) Other Reaction: Intolerance Other reaction(s): Other (See Comments) Other Reaction: Intolerance    Patient Care Team: Mar Daring, PA-C as PCP - General (Family Medicine) Pa, McDonald (Optometry) Germaine Pomfret, [facility] (Pharmacist)   Medications: Outpatient Medications Prior to Visit  Medication Sig   acetaminophen (TYLENOL) 500 MG tablet Take 1,000 mg by mouth every 6 (six) hours as needed.   benzonatate (TESSALON PERLES) 100 MG capsule Take 1 capsule (100 mg total) by mouth 3 (three) times daily as needed for cough.   [facility] CALCIUM 600 MG tablet Take 1 tablet by mouth 2 (two) times daily with a meal.   fluticasone (FLONASE) 50 MCG/ACT nasal spray Place 2 sprays into both nostrils daily.   levothyroxine (SYNTHROID) 75 MCG tablet TAKE 1 TABLET BY MOUTH EVERY DAY   loratadine (CLARITIN) 10 MG tablet Take 1 tablet (10 mg total) by mouth at bedtime as needed for allergies.   oxymetazoline (AFRIN) 0.05 % nasal spray Place 1 spray into both nostrils 2 (two) times daily. Never take more than 4 days in a row   simvastatin (ZOCOR) 10 MG tablet TAKE 1 TABLET BY MOUTH EVERYDAY AT BEDTIME   Vitamin D, Ergocalciferol, (DRISDOL) 1.25 MG (50000 UNIT) CAPS capsule TAKE 1 CAPSULE (50,000 UNITS TOTAL) BY MOUTH EVERY 30 (THIRTY) DAYS.   XARELTO 20 MG TABS tablet TAKE 1 TABLET (20 MG TOTAL) BY MOUTH DAILY WITH SUPPER.   No facility-administered medications prior to visit.    Review of Systems  Eyes:  Positive for redness.  All other systems reviewed and are negative.  Last CBC Lab Results  Component Value Date   WBC 6.6 04/10/2021   HGB 13.2 04/10/2021   HCT 39.5 04/10/2021   MCV 88.4 04/10/2021   MCH 29.5 04/10/2021   RDW 14.9 04/10/2021   PLT 207 [id number]   Last metabolic panel Lab Results  Component Value Date   GLUCOSE 118 (H) 04/10/2021   NA 138 04/10/2021   K 4.7 04/10/2021   CL 103 04/10/2021   CO2 29  04/10/2021   BUN 15 04/10/2021   CREATININE 0.64 04/10/2021   GFRNONAA >60 04/10/2021   GFRAA 88 04/27/2020   CALCIUM 9.3 04/10/2021   PROT 8.5 (H) 04/10/2021   ALBUMIN 4.5 04/10/2021   LABGLOB 2.8 04/27/2020   AGRATIO 1.5 04/27/2020   BILITOT 0.8 04/10/2021   ALKPHOS 51 04/10/2021   AST 28 04/10/2021   ALT 17 04/10/2021   ANIONGAP  6 04/10/2021   Last lipids Lab Results  Component Value Date   CHOL 181 04/27/2020   HDL 70 04/27/2020   LDLCALC 93 04/27/2020   TRIG 99 04/27/2020   CHOLHDL 2.6 04/27/2020   Last hemoglobin A1c Lab Results  Component Value Date   HGBA1C 6.0 (H) 04/27/2020   Last thyroid functions Lab Results  Component Value Date   TSH 1.370 04/27/2020   Last vitamin D Lab Results  Component Value Date   VD25OH 38.1 04/27/2020    Objective    There were no vitals taken for this visit. BP Readings from Last 3 Encounters:  04/10/21 (!) 146/64  01/26/21 132/80  12/01/20 126/70   Wt Readings from Last 3 Encounters: 04/10/21 106 lb (48.1 kg) 01/26/21 106 lb (48.1 kg) 12/01/20 103 lb (46.7 kg)   Physical Exam Constitutional:      General: She is not in acute distress.    Appearance: She is well-developed.  HENT:     Head: Normocephalic and atraumatic.     Right Ear: Hearing and tympanic membrane normal.     Left Ear: Hearing and tympanic membrane normal.     Nose: Nose normal.     Mouth/Throat:     Pharynx: Oropharynx is clear.  Eyes:     General: Lids are normal. No scleral icterus.       Right eye: No discharge.        Left eye: No discharge.     Extraocular Movements: Extraocular movements intact.     Conjunctiva/sclera: Conjunctivae normal.     Comments: Subconjunctival hemorrhage nasally on the right eye. No pain or vision disturbance.  Pulmonary:     Effort: Pulmonary effort is normal. No respiratory distress.  Musculoskeletal:        General: Normal range of motion.  Skin:    Findings: No lesion or rash.  Neurological:      Mental Status: She is alert and oriented to person, place, and time.  Psychiatric:        Speech: Speech normal.        Behavior: Behavior normal.        Thought Content: Thought content normal.    Diabetic Foot Exam - Simple   Simple Foot Form  04/29/2021 11:00 AM  Visual Inspection No deformities, no ulcerations, no other skin breakdown bilaterally: Yes Sensation Testing Intact to touch and monofilament testing bilaterally: Yes Pulse Check Posterior Tibialis and Dorsalis pulse intact bilaterally: Yes Comments Normal sensation to test with nylon string. No open sores or calluses.      Last depression screening scores PHQ 2/9 Scores 01/26/2021 04/20/2020 01/28/2019  PHQ - 2 Score 0 0 0  PHQ- 9 Score 0 - 0   Last fall risk screening Fall Risk  01/26/2021  Falls in the past year? 0  Number falls in past yr: 0  Injury with Fall? 0  Comment -  Risk for fall due to : -  Risk for fall due to: Comment -  Follow up Falls evaluation completed   Last Audit-C alcohol use screening Alcohol Use Disorder Test (AUDIT) 01/26/2021  1. How often do you have a drink containing alcohol? 0  2. How many drinks containing alcohol do you have on a typical day when you are drinking? 0  3. How often do you have six or more drinks on one occasion? 0  AUDIT-C Score 0  Alcohol Brief Interventions/Follow-up -   A score of 3 or more in women, and  4 or more in men indicates increased risk for alcohol abuse, EXCEPT if all of the points are from question 1   No results found for any visits on 04/29/21.  Assessment & Plan    Routine Health Maintenance and Physical Exam  Exercise Activities and Dietary recommendations  Goals      Chronic Care Management     CARE PLAN ENTRY (see longitudinal plan of care for additional care plan information)  Current Barriers:  Chronic Disease Management support, education, and care coordination needs related to Hyperlipidemia, Diabetes, Hypothyroidism, Osteoporosis,  Osteoarthritis, Allergic Rhinitis and Barrett esophagus, and history of DVT    Hyperlipidemia Lab Results  Component Value Date/Time   LDLCALC 93 04/27/2020 10:26 AM  Pharmacist Clinical Goal(s): Over the next 90 days, patient will work with PharmD and providers to maintain LDL goal < 100 Current regimen:  Simvastatin 10 mg daily  Interventions: Discussed low cholesterol diet and exercising as tolerated extensively Will initiate cholesterol monitoring plan   Diabetes Lab Results  Component Value Date/Time   HGBA1C 6.0 (H) 04/27/2020 10:26 AM   HGBA1C 5.8 (H) 04/18/2019 09:11 AM  Pharmacist Clinical Goal(s): Over the next 90 days, patient will work with PharmD and providers to maintain A1c goal <7% Current regimen:  None Interventions: Discussed carbohydrate counting and exercising as tolerated extensively Will initiate blood sugar monitoring plan  Patient self care activities - Over the next 90 days, patient will: Contact provider with any episodes of hypoglycemia  Medication management Pharmacist Clinical Goal(s): Over the next 90 days, patient will work with PharmD and providers to maintain optimal medication adherence [facility] Interventions Comprehensive medication review performed. Continue current medication management strategy Patient self care activities - Over the next 90 days, patient will: Take medications as prescribed Report any questions or concerns to PharmD and/or provider(s)     Exercise 3x per week (30 min per time)     Recommend to exercise for 3 days a week for at least 30 minutes at a time.      Increase water intake     Continue drinking 6-8 glasses of water a day.         Immunization History  Administered Date(s) Administered   Influenza, High Dose Seasonal PF 09/03/2014, 08/09/2016   Influenza,inj,Quad PF,6+ Mos 06/24/2013   Pneumococcal Conjugate-13 05/14/2014   Pneumococcal Polysaccharide-23 05/27/2011   Td 10/03/1997    Tdap 05/27/2011, 11/21/2015, 12/06/2019   Zoster, Live 02/24/2012    Health Maintenance  Topic Date Due   COVID-19 Vaccine (1) Never done   FOOT EXAM  Never done   Zoster Vaccines- Shingrix (1 of 2) Never done   OPHTHALMOLOGY EXAM  06/03/2017   HEMOGLOBIN A1C  10/28/2020   URINE MICROALBUMIN  04/27/2021   INFLUENZA VACCINE  05/03/2021   DEXA SCAN  05/12/2022   TETANUS/TDAP  12/05/2029   PNA vac Low Risk Adult  Completed   HPV VACCINES  Aged Out    Discussed health benefits of physical activity, and encouraged her to engage in regular exercise appropriate for her age and condition.  1. Annual physical exam General health stable without significant concerns except subconjunctival hemorrhage when she woke up yesterday. Counseled regarding benign nature of this finding since she is not having any visual disturbance, pain or itching.  2. Avitaminosis D Vitamin D level up to 38 on 04-27-20. No longer taking any supplement. Recheck CBC for any signs of anemia. - CBC with Differential/Platelet  3. Acquired hypothyroidism Tolerating Levothyroxine  75 mcg qd without significant heat or cold intolerance, diarrhea, palpitations or tremor. Recheck TSH. - TSH  4. Seasonal allergic rhinitis, unspecified trigger Treated with Flonase and Claritin daily. Good control of symptoms. - CBC with Differential/Platelet  5. History of DVT (deep vein thrombosis) Presently on Xarelto 20 mg qd for clot prevention. No significant peripheral edema. Encouraged to use support hose.  6. Hypercholesteremia Tolerating Simvastatin 10 mg hs without side effects. Recheck labs. - Comprehensive metabolic panel - Lipid panel  7. Type 2 diabetes mellitus without complication, without long-term current use of insulin (HCC) Last Hgb A1C was 6.0 on 04-27-20. No significant polydipsia, polyphagia, polyuria or peripheral neuropathy. Continue diet and exercise control with increase in water intake. Recheck follow up labs.  Recommend annual ophthalmology exam. - CBC with Differential/Platelet - Comprehensive metabolic panel - Hemoglobin A1c  8. Subconjunctival hemorrhage of right eye No known injury. No visual disturbance, itching or pain. Suspect mild irritation during sleep with history of Eliquis use. Should see her ophthalmologist if vision changes or worsening.   No follow-ups on file.     I, Maye Parkinson, PA-C, have reviewed all documentation for this visit. The documentation on 04/29/21 for the exam, diagnosis, procedures, and orders are all accurate and complete.    Vernie Murders, PA-C  [employer] [phone] (phone) [phone] (fax)  [place]

## 2021-05-03 DIAGNOSIS — E119 Type 2 diabetes mellitus without complications: Secondary | ICD-10-CM | POA: Diagnosis not present

## 2021-05-03 DIAGNOSIS — E039 Hypothyroidism, unspecified: Secondary | ICD-10-CM | POA: Diagnosis not present

## 2021-05-03 DIAGNOSIS — E7800 Pure hypercholesterolemia, unspecified: Secondary | ICD-10-CM | POA: Diagnosis not present

## 2021-05-03 DIAGNOSIS — J302 Other seasonal allergic rhinitis: Secondary | ICD-10-CM | POA: Diagnosis not present

## 2021-05-03 DIAGNOSIS — E559 Vitamin D deficiency, unspecified: Secondary | ICD-10-CM | POA: Diagnosis not present

## 2021-05-04 LAB — COMPREHENSIVE METABOLIC PANEL
ALT: 14 IU/L (ref 0–32)
AST: 24 IU/L (ref 0–40)
Albumin/Globulin Ratio: 1.4 (ref 1.2–2.2)
Albumin: 4.2 g/dL (ref 3.7–4.7)
Alkaline Phosphatase: 54 IU/L (ref 44–121)
BUN/Creatinine Ratio: 17 (ref 12–28)
BUN: 11 mg/dL (ref 8–27)
Bilirubin Total: 0.5 mg/dL (ref 0.0–1.2)
CO2: 24 mmol/L (ref 20–29)
Calcium: 9.3 mg/dL (ref 8.7–10.3)
Chloride: 100 mmol/L (ref 96–106)
Creatinine, Ser: 0.66 mg/dL (ref 0.57–1.00)
Globulin, Total: 3 g/dL (ref 1.5–4.5)
Glucose: 104 mg/dL — ABNORMAL HIGH (ref 65–99)
Potassium: 4.3 mmol/L (ref 3.5–5.2)
Sodium: 140 mmol/L (ref 134–144)
Total Protein: 7.2 g/dL (ref 6.0–8.5)
eGFR: 89 mL/min/{1.73_m2} (ref >59)

## 2021-05-04 LAB — CBC WITH DIFFERENTIAL/PLATELET
Basophils Absolute: 0.1 10*3/uL (ref 0.0–0.2)
Basos: 1 %
EOS (ABSOLUTE): 0.3 10*3/uL (ref 0.0–0.4)
Eos: 5 %
Hematocrit: 37.8 % (ref 34.0–46.6)
Hemoglobin: 12.8 g/dL (ref 11.1–15.9)
Immature Grans (Abs): 0 10*3/uL (ref 0.0–0.1)
Immature Granulocytes: 0 %
Lymphocytes Absolute: 1.4 10*3/uL (ref 0.7–3.1)
Lymphs: 29 %
MCH: 29.3 pg (ref 26.6–33.0)
MCHC: 33.9 g/dL (ref 31.5–35.7)
MCV: 87 fL (ref 79–97)
Monocytes Absolute: 0.4 10*3/uL (ref 0.1–0.9)
Monocytes: 8 %
Neutrophils Absolute: 2.7 10*3/uL (ref 1.4–7.0)
Neutrophils: 57 %
Platelets: 224 10*3/uL (ref 150–450)
RBC: 4.37 x10E6/uL (ref 3.77–5.28)
RDW: 15.4 % (ref 11.7–15.4)
WBC: 4.8 10*3/uL (ref 3.4–10.8)

## 2021-05-04 LAB — LIPID PANEL
Chol/HDL Ratio: 2.8 ratio (ref 0.0–4.4)
Cholesterol, Total: 201 mg/dL — ABNORMAL HIGH (ref 100–199)
HDL: 72 mg/dL (ref >39)
LDL Chol Calc (NIH): 105 mg/dL — ABNORMAL HIGH (ref 0–99)
Triglycerides: 136 mg/dL (ref 0–149)
VLDL Cholesterol Cal: 24 mg/dL (ref 5–40)

## 2021-05-04 LAB — TSH: TSH: 2.74 u[IU]/mL (ref 0.450–4.500)

## 2021-05-04 LAB — HEMOGLOBIN A1C
Est. average glucose Bld gHb Est-mCnc: 117 mg/dL
Hgb A1c MFr Bld: 5.7 % — ABNORMAL HIGH (ref 4.8–5.6)

## 2021-06-14 ENCOUNTER — Telehealth: Payer: Self-pay

## 2021-06-14 DIAGNOSIS — I82462 Acute embolism and thrombosis of left calf muscular vein: Secondary | ICD-10-CM
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
## 2021-06-21 MED ORDER — RIVAROXABAN 20 MG PO TABS: 20.0000 mg | ORAL_TABLET | Freq: Every day | ORAL | 3 refills | Status: DC

## 2021-06-21 NOTE — Addendum Note (Signed)
Addended by: Daron Offer A on: 06/21/2021 01:29 PM   Modules accepted: Orders

## 2021-06-30 ENCOUNTER — Other Ambulatory Visit: Payer: Self-pay | Admitting: Physician Assistant

## 2021-06-30 DIAGNOSIS — E7800 Pure hypercholesterolemia, unspecified: Secondary | ICD-10-CM

## 2021-06-30 DIAGNOSIS — E039 Hypothyroidism, unspecified: Secondary | ICD-10-CM

## 2021-07-15 ENCOUNTER — Other Ambulatory Visit: Payer: Self-pay | Admitting: Physician Assistant

## 2021-07-15 DIAGNOSIS — E559 Vitamin D deficiency, unspecified: Secondary | ICD-10-CM

## 2021-09-29 ENCOUNTER — Other Ambulatory Visit: Payer: Self-pay | Admitting: Family Medicine

## 2021-09-29 DIAGNOSIS — E7800 Pure hypercholesterolemia, unspecified: Secondary | ICD-10-CM

## 2021-09-29 NOTE — Telephone Encounter (Signed)
Requested medications are due for refill today.  yes  Requested medications are on the active medications list.  yes  Last refill. 06/30/2021  Future visit scheduled.   no  Notes to clinic.  Pharmacy needs Dx code.    Requested Prescriptions  Pending Prescriptions Disp Refills   simvastatin (ZOCOR) 10 MG tablet [Pharmacy Med Name: SIMVASTATIN 10 MG TABLET] 90 tablet 0    Sig: TAKE 1 TABLET BY MOUTH EVERYDAY AT BEDTIME     Cardiovascular:  Antilipid - Statins Failed - 09/29/2021  5:16 PM      Failed - Total Cholesterol in normal range and within 360 days    Cholesterol, Total  Date Value Ref Range Status  05/03/2021 201 (H) 100 - 199 mg/dL Final          Failed - LDL in normal range and within 360 days    LDL Chol Calc (NIH)  Date Value Ref Range Status  05/03/2021 105 (H) 0 - 99 mg/dL Final          Passed - HDL in normal range and within 360 days    HDL  Date Value Ref Range Status  05/03/2021 72 >39 mg/dL Final          Passed - Triglycerides in normal range and within 360 days    Triglycerides  Date Value Ref Range Status  05/03/2021 136 0 - 149 mg/dL Final          Passed - Patient is not pregnant      Passed - Valid encounter within last 12 months    Recent Outpatient Visits           5 months ago Annual physical exam   [place], PA-C   8 months ago Seasonal allergic rhinitis due to pollen   [employer], Laurita Quint, FNP   9 months ago Pneumonia due to COVID-19 virus   [employer], [place], PA-C   10 months ago Multifocal pneumonia   [facility] [place], Clearnce Sorrel, [place]   11 months ago No-show for appointment   [facility], [place], [place]

## 2021-10-11 ENCOUNTER — Telehealth: Payer: Self-pay

## 2021-10-11 NOTE — Progress Notes (Signed)
° ° °  Chronic Care Management Pharmacy Assistant   Name: Sherry Jones  MRN: [id number] DOB: Feb 27, 1941  Patient called to be reminded of her telephone appointment with Junius Argyle, CPP on 10/12/2021 @ 1100  No answer, left message of appointment date, time and type of appointment (either telephone or in person). Left message to have all medications, supplements, blood pressure and/or blood sugar logs available during appointment and to return call if need to reschedule.  Star Rating Drug: Simvastatin 10 mg last filled on 06/30/2021 [facility]  Any gaps in medications fill history? Yes  Care Gaps: COVID-19 Diabetic Foot Exam Zoster Vaccines Diabetic Eye Exam Urine Microalbumin Influenza Vaccine BP > 140/90 A1C last completed 5 months ago  Lynann Bologna, CPA/CMA Clinical Pharmacist Assistant Phone: [phone]

## 2021-10-12 ENCOUNTER — Ambulatory Visit (INDEPENDENT_AMBULATORY_CARE_PROVIDER_SITE_OTHER): Payer: Medicare Other

## 2021-10-12 DIAGNOSIS — E7800 Pure hypercholesterolemia, unspecified: Secondary | ICD-10-CM

## 2021-10-12 DIAGNOSIS — E039 Hypothyroidism, unspecified: Secondary | ICD-10-CM

## 2021-10-12 DIAGNOSIS — E119 Type 2 diabetes mellitus without complications: Secondary | ICD-10-CM

## 2021-10-12 DIAGNOSIS — M810 Age-related osteoporosis without current pathological fracture: Secondary | ICD-10-CM

## 2021-10-12 DIAGNOSIS — I82409 Acute embolism and thrombosis of unspecified deep veins of unspecified lower extremity: Secondary | ICD-10-CM

## 2021-10-12 MED ORDER — RIVAROXABAN 20 MG PO TABS: 20.0000 mg | ORAL_TABLET | Freq: Every day | ORAL | 3 refills | Status: DC
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
## 2021-10-12 NOTE — Patient Instructions (Signed)
Visit Information It was great speaking with you today!  Please let me know if you have any questions about our visit.   Goals Addressed             This Visit's Progress    Manage My Medicine       Timeframe:  Long-Range Goal Priority:  High Start Date: 10/12/2021                            Expected End Date: 10/12/2022                      Follow Up within 90 days   - call for medicine refill 2 or 3 days before it runs out - keep a list of all the medicines I take; vitamins and herbals too - use a pillbox to sort medicine    Why is this important?   These steps will help you keep on track with your medicines.   Notes:         Patient Care Plan: General Pharmacy (Adult)     Problem Identified: Hyperlipidemia, Diabetes, Hypothyroidism, Osteoporosis, Allergic Rhinitis, and History of DVT   Priority: High     Long-Range Goal: Patient-Specific Goal   Start Date: 10/12/2021  Expected End Date: 10/12/2022  This Visit's Progress: On track  Priority: High  Note:   Current Barriers:  No barriers noted  Pharmacist Clinical Goal(s):  Patient will verbalize ability to afford treatment regimen through collaboration with PharmD and provider.   Interventions: 1:1 collaboration with Lavon Paganini, MD regarding development and update of comprehensive plan of care as evidenced by provider attestation and co-signature Inter-disciplinary care team collaboration (see longitudinal plan of care) Comprehensive medication review performed; medication list updated in electronic medical record  Hyperlipidemia: (LDL goal < 100) -Controlled, given age greater than 66 -Current treatment: Simvastatin 10 mg daily: Appropriate, Effective, Safe, Accessible -Medications previously tried: NA  -Recommended to continue current medication  Diabetes (A1c goal <6.5%) -Controlled -Current medications: None -Medications previously tried: NA  -Denies hypoglycemic/hyperglycemic  -Recommended  to continue current medication  Osteoporosis  (Goal Prevent Fractures) -Controlled -Patient is not a candidate for pharmacologic treatment -Current treatment  Calcium 600 mg twice daily: Appropriate, Effective, Safe, Accessible Vitamin D 2000 units daily: Appropriate, Effective, Safe, Accessible -Medications previously tried: Ergocalciferol -Recommended to continue current medication  Hypothyroidism (Goal: Maintain thyroid function) -Controlled -Current treatment  Levothyroxine 75 mcg daily: Appropriate, Effective, Safe, Accessible  -Medications previously tried: NA  -Recommended to continue current medication  History of Recurrent DVTs (Goal: Prevent clots) -Not ideally controlled -Current treatment  Xarelto 20 mg daily: Appropriate, Effective, Safe, Query Accessible  -Medications previously tried: NA -Patient has not been taking Xarelto, was told by PCP she no longer needed. Given history of recurrent DVT, recommend restarting Xarelto. -Patient reports swelling in legs, concerned whether it could be varicose veins. -Denies previous affordability concerns with Xarelto -[facility].  Allergic Rhinitis (Goal: Minimize allergy symptoms) -Controlled -Current treatment  Flonase 50 mcg/act daily as needed: Appropriate, Effective, Safe, Accessible Loratadine 10 mg daily as needed: Appropriate, Effective, Safe, Accessible -Medications previously tried: NA -Patient has not required treatments for allergy symptoms, typically worsen in spring.  -Recommended to continue current medication   Patient Goals/Self-Care Activities Patient will:  - target a minimum of 150 minutes of moderate intensity exercise weekly  Follow Up Plan: Telephone follow  up appointment with care management team member scheduled for:  10/10/2022 at 11:00 AM     Patient agreed to services and verbal consent obtained.   Patient verbalizes understanding of instructions  provided today and agrees to view in [place].   Junius Argyle, PharmD, Para [place], CPP  Clinical Pharmacist Practitioner  [facility] [phone]

## 2021-11-02 DIAGNOSIS — E119 Type 2 diabetes mellitus without complications: Secondary | ICD-10-CM

## 2021-11-02 DIAGNOSIS — E039 Hypothyroidism, unspecified: Secondary | ICD-10-CM

## 2021-11-02 DIAGNOSIS — M810 Age-related osteoporosis without current pathological fracture: Secondary | ICD-10-CM

## 2021-11-02 DIAGNOSIS — E7800 Pure hypercholesterolemia, unspecified: Secondary | ICD-10-CM

## 2021-12-08 DIAGNOSIS — M67814 Other specified disorders of tendon, left shoulder: Secondary | ICD-10-CM | POA: Diagnosis not present

## 2022-01-20 ENCOUNTER — Encounter: Payer: Self-pay | Admitting: Family Medicine

## 2022-01-20 ENCOUNTER — Ambulatory Visit (INDEPENDENT_AMBULATORY_CARE_PROVIDER_SITE_OTHER): Payer: Medicare Other | Admitting: Family Medicine

## 2022-01-20 VITALS — BP 146/86 | HR 91 | Temp 98.3°F | Resp 20 | Wt 108.0 lb

## 2022-01-20 DIAGNOSIS — J0140 Acute pansinusitis, unspecified: Secondary | ICD-10-CM | POA: Diagnosis not present

## 2022-01-20 DIAGNOSIS — J00 Acute nasopharyngitis [common cold]: Secondary | ICD-10-CM | POA: Insufficient documentation

## 2022-01-20 MED ORDER — IPRATROPIUM BROMIDE 0.06 % NA SOLN: 2.0000 | Freq: Four times a day (QID) | NASAL | 12 refills | Status: DC

## 2022-01-20 MED ORDER — PREDNISONE 5 MG PO TABS: 5.0000 mg | ORAL_TABLET | Freq: Every day | ORAL | 0 refills | Status: DC

## 2022-01-20 MED ORDER — AZITHROMYCIN 250 MG PO TABS: ORAL_TABLET | ORAL | 0 refills | Status: AC

## 2022-01-20 NOTE — Assessment & Plan Note (Signed)
3 day course; however, pt has been febrile based on report ?Will treat today ?RTC in 7-10 days if does not improve ?Low dose prednisone in addition to abx ?Continue allergy medications  ?

## 2022-01-20 NOTE — Progress Notes (Signed)
?  ? ?I,Roshena L Chambers,acting as a scribe for Gwyneth Sprout, FNP.,have documented all relevant documentation on the behalf of Gwyneth Sprout, FNP,as directed by  Gwyneth Sprout, FNP while in the presence of Gwyneth Sprout, FNP.  ? ? ?Established patient visit ? ? ?Patient: Sherry Jones   DOB: [birth year]   81 y.o. Female  MRN: [id number] ?Visit Date: 01/20/2022 ? ?Today's healthcare provider: Gwyneth Sprout, FNP  ?Introduced to Designer, jewellery role and practice setting.  All questions answered.  Discussed provider/patient relationship and expectations. ? ? ?Chief Complaint  ?Patient presents with  ? Cough  ? ?Subjective  ?  ?Cough ?This is a new problem. Episode onset: 3 days ago. The problem has been unchanged. The cough is Productive of sputum (yellow white sputum). Associated symptoms include nasal congestion, postnasal drip, rhinorrhea, shortness of breath (with exertion) and sweats. Pertinent negatives include no chest pain, chills, fever, hemoptysis or sore throat. Treatments tried: OTC antihistamine and flonase nasal spray. The treatment provided no relief.   ? ? ?Medications: ?Outpatient Medications Prior to Visit  ?Medication Sig  ? Cholecalciferol (VITAMIN D) 50 MCG (2000 UT) tablet Take 2,000 Units by mouth daily.  ? [facility] CALCIUM 600 MG tablet Take 1 tablet by mouth 2 (two) times daily with a meal.  ? fluticasone (FLONASE) 50 MCG/ACT nasal spray Place 2 sprays into both nostrils daily.  ? levothyroxine (SYNTHROID) 75 MCG tablet TAKE 1 TABLET BY MOUTH EVERY DAY  ? loratadine (CLARITIN) 10 MG tablet Take 1 tablet (10 mg total) by mouth at bedtime as needed for allergies.  ? simvastatin (ZOCOR) 10 MG tablet TAKE 1 TABLET BY MOUTH EVERYDAY AT BEDTIME  ? rivaroxaban (XARELTO) 20 MG TABS tablet Take 1 tablet (20 mg total) by mouth daily with supper. (Patient not taking: Reported on 01/20/2022)  ? ?No facility-administered medications prior to visit.  ? ? ?Review of Systems  ?Constitutional:  Positive for  diaphoresis. Negative for appetite change, chills, fatigue and fever.  ?HENT:  Positive for congestion, postnasal drip, rhinorrhea and sinus pressure. Negative for sore throat.   ?Respiratory:  Positive for cough and shortness of breath (with exertion). Negative for hemoptysis and chest tightness.   ?Cardiovascular:  Negative for chest pain and palpitations.  ?Gastrointestinal:  Negative for abdominal pain, nausea and vomiting.  ?Neurological:  Negative for dizziness and weakness.  ? ? ?  Objective  ?  ?BP (!) 146/86 (BP Location: Left Arm, Patient Position: Sitting, Cuff Size: Normal)   Pulse 91   Temp 98.3 ?F (36.8 ?C) (Oral)   Resp 20   Wt 108 lb (49 kg)   SpO2 97% Comment: room air  BMI 23.37 kg/m?  ? ? ?Physical Exam ?Constitutional:   ?   General: She is not in acute distress. ?   Appearance: She is normal weight. She is ill-appearing. She is not toxic-appearing or diaphoretic.  ?HENT:  ?   Right Ear: Tympanic membrane, ear canal and external ear normal. There is no impacted cerumen.  ?   Left Ear: Tympanic membrane, ear canal and external ear normal. There is no impacted cerumen.  ?   Nose: Congestion and rhinorrhea present.  ?   Mouth/Throat:  ?   Mouth: Mucous membranes are moist.  ?   Pharynx: No oropharyngeal exudate or posterior oropharyngeal erythema.  ?Eyes:  ?   Pupils: Pupils are equal, round, and reactive to light.  ?Cardiovascular:  ?   Rate and Rhythm: Normal rate  and regular rhythm.  ?   Pulses: Normal pulses.  ?   Heart sounds: Murmur heard.  ?Musculoskeletal:     ?   General: Normal range of motion.  ?Skin: ?   General: Skin is warm and dry.  ?   Capillary Refill: Capillary refill takes less than 2 seconds.  ?Neurological:  ?   General: No focal deficit present.  ?   Mental Status: She is alert. Mental status is at baseline.  ?Psychiatric:     ?   Mood and Affect: Mood normal.     ?   Behavior: Behavior normal.     ?   Thought Content: Thought content normal.     ?   Judgment: Judgment  normal.  ?  ? ?No results found for any visits on 01/20/22. ? Assessment & Plan  ?  ? ?Problem List Items Addressed This Visit   ? ?  ? Respiratory  ? Acute non-recurrent pansinusitis - Primary  ?  3 day course; however, pt has been febrile based on report ?Will treat today ?RTC in 7-10 days if does not improve ?Low dose prednisone in addition to abx ?Continue allergy medications  ? ?  ?  ? Relevant Medications  ? azithromycin (ZITHROMAX) 250 MG tablet  ? predniSONE (DELTASONE) 5 MG tablet  ? ipratropium (ATROVENT) 0.06 % nasal spray  ? Acute rhinitis  ?  Non stop nasal drainage; clear discharge ?Presume allergic d/t clover picking ?Continue home claritin and flonase ?Add nasal spray to assist QID tx ? ?  ?  ? Relevant Medications  ? ipratropium (ATROVENT) 0.06 % nasal spray  ? ? ? ?Return in about 10 days (around 01/30/2022), or if symptoms worsen or fail to improve.  ?   ? ?I, Gwyneth Sprout, FNP, have reviewed all documentation for this visit. The documentation on 01/20/22 for the exam, diagnosis, procedures, and orders are all accurate and complete. ? ? ? ?Gwyneth Sprout, FNP  ?[place] ?[phone] (phone) ?[phone] (fax) ? ?[facility]  ?

## 2022-01-20 NOTE — Assessment & Plan Note (Signed)
Non stop nasal drainage; clear discharge ?Presume allergic d/t clover picking ?Continue home claritin and flonase ?Add nasal spray to assist QID tx ?

## 2022-02-18 DIAGNOSIS — M75102 Unspecified rotator cuff tear or rupture of left shoulder, not specified as traumatic: Secondary | ICD-10-CM | POA: Diagnosis not present

## 2022-02-18 DIAGNOSIS — Z9889 Other specified postprocedural states: Secondary | ICD-10-CM | POA: Diagnosis not present

## 2022-02-18 DIAGNOSIS — M25512 Pain in left shoulder: Secondary | ICD-10-CM | POA: Diagnosis not present

## 2022-02-18 DIAGNOSIS — M19012 Primary osteoarthritis, left shoulder: Secondary | ICD-10-CM | POA: Diagnosis not present

## 2022-03-21 ENCOUNTER — Other Ambulatory Visit: Payer: Self-pay

## 2022-03-21 ENCOUNTER — Emergency Department: Payer: Medicare Other

## 2022-03-21 ENCOUNTER — Emergency Department
Admission: EM | Admit: 2022-03-21 | Discharge: 2022-03-21 | Disposition: A | Discharge status: Home / Self Care | Payer: Medicare Other | Attending: Emergency Medicine | Admitting: Emergency Medicine

## 2022-03-21 DIAGNOSIS — Z9889 Other specified postprocedural states: Secondary | ICD-10-CM | POA: Diagnosis not present

## 2022-03-21 DIAGNOSIS — M25761 Osteophyte, right knee: Secondary | ICD-10-CM | POA: Diagnosis not present

## 2022-03-21 DIAGNOSIS — R519 Headache, unspecified: Secondary | ICD-10-CM | POA: Insufficient documentation

## 2022-03-21 DIAGNOSIS — M25512 Pain in left shoulder: Secondary | ICD-10-CM | POA: Diagnosis not present

## 2022-03-21 DIAGNOSIS — S199XXA Unspecified injury of neck, initial encounter: Secondary | ICD-10-CM | POA: Diagnosis not present

## 2022-03-21 DIAGNOSIS — M1711 Unilateral primary osteoarthritis, right knee: Secondary | ICD-10-CM | POA: Diagnosis not present

## 2022-03-21 DIAGNOSIS — Z743 Need for continuous supervision: Secondary | ICD-10-CM | POA: Diagnosis not present

## 2022-03-21 DIAGNOSIS — S8991XA Unspecified injury of right lower leg, initial encounter: Secondary | ICD-10-CM | POA: Diagnosis not present

## 2022-03-21 DIAGNOSIS — Y9301 Activity, walking, marching and hiking: Secondary | ICD-10-CM | POA: Diagnosis not present

## 2022-03-21 DIAGNOSIS — S59902A Unspecified injury of left elbow, initial encounter: Secondary | ICD-10-CM | POA: Diagnosis not present

## 2022-03-21 DIAGNOSIS — W01190A Fall on same level from slipping, tripping and stumbling with subsequent striking against furniture, initial encounter: Secondary | ICD-10-CM | POA: Diagnosis not present

## 2022-03-21 DIAGNOSIS — W19XXXA Unspecified fall, initial encounter: Secondary | ICD-10-CM

## 2022-03-21 DIAGNOSIS — E119 Type 2 diabetes mellitus without complications: Secondary | ICD-10-CM | POA: Insufficient documentation

## 2022-03-21 DIAGNOSIS — S4992XA Unspecified injury of left shoulder and upper arm, initial encounter: Secondary | ICD-10-CM | POA: Diagnosis not present

## 2022-03-21 DIAGNOSIS — Z8541 Personal history of malignant neoplasm of cervix uteri: Secondary | ICD-10-CM | POA: Diagnosis not present

## 2022-03-21 DIAGNOSIS — M25562 Pain in left knee: Secondary | ICD-10-CM | POA: Diagnosis not present

## 2022-03-21 DIAGNOSIS — S0990XA Unspecified injury of head, initial encounter: Secondary | ICD-10-CM | POA: Diagnosis not present

## 2022-03-21 DIAGNOSIS — M19012 Primary osteoarthritis, left shoulder: Secondary | ICD-10-CM | POA: Diagnosis not present

## 2022-03-21 DIAGNOSIS — M47812 Spondylosis without myelopathy or radiculopathy, cervical region: Secondary | ICD-10-CM | POA: Diagnosis not present

## 2022-03-21 DIAGNOSIS — Z86718 Personal history of other venous thrombosis and embolism: Secondary | ICD-10-CM | POA: Diagnosis not present

## 2022-03-21 DIAGNOSIS — Z96652 Presence of left artificial knee joint: Secondary | ICD-10-CM | POA: Diagnosis not present

## 2022-03-21 MED ORDER — ACETAMINOPHEN 500 MG PO TABS
1000.0000 mg | ORAL_TABLET | Freq: Once | ORAL | Status: AC
  Administered 2022-03-21: 1000 mg via ORAL
  Filled 2022-03-21: qty 2

## 2022-03-21 NOTE — ED Notes (Signed)
Pt DC to home. DC instructions reviewed with pt with all questions answered. Pt verbalizes understanding. Pt left dept via wheelchair.

## 2022-03-21 NOTE — ED Triage Notes (Signed)
Pt states she fell while ambulating over a cat. Pt denies loc. Pt is wearing a rigid collar. Pt states she fell forward first landing on bilateral knees, then fell backwards striking posterior skull, left shoulder and elbow. Pt complains of neck pain as well.

## 2022-03-21 NOTE — ED Provider Notes (Signed)
[facility] Provider Note    Event Date/Time   First MD Initiated Contact with Patient 03/21/22 [phone]     (approximate)   History   Fall   HPI  Sherry Jones is a 81 y.o. female with history of hypercholesterolemia, diabetes, and hyperlipidemia who presents from home for evaluation after mechanical fall.  Patient reports that she was walking when her cat crossed in front of her so she lost her balance and fell onto her knees.  She then lost balance and fell backwards and hit her head on the floor.  No LOC.  She does not take any blood thinners.  She is complaining mostly of pain in the left shoulder but also has had a mild headache.  She denies neck pain or back pain, hip pain, chest pain or abdominal pain.     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   Lifelong prophylaxis treatment   Thyroid disease     Past Surgical History:  Procedure Laterality Date   ABDOMINAL HYSTERECTOMY     CATARACT EXTRACTION     REPLACEMENT TOTAL KNEE     left knee   ROTATOR CUFF REPAIR Left      Physical Exam   Triage Vital Signs: ED Triage Vitals  Enc Vitals Group     BP 03/21/22 0031 (!) 142/107     Pulse Rate 03/21/22 0031 91     Resp 03/21/22 0031 16     Temp 03/21/22 0031 98.2 F (36.8 C)     Temp Source 03/21/22 0031 Oral     SpO2 03/21/22 0031 96 %     Weight 03/21/22 0032 110 lb 3.7 oz (50 kg)     Height 03/21/22 0032 '4\' 9"'$  (1.448 m)     Head Circumference --      Peak Flow --      Pain Score 03/21/22 0031 10     Pain Loc --      Pain Edu? --      Excl. in [place]? --     Most recent vital signs: Vitals:   03/21/22 0031 03/21/22 0337  BP: (!) 142/107 (!) 178/74  Pulse: 91 78  Resp: 16 18  Temp: 98.2 F (36.8 C) 97.9 F (36.6 C)  SpO2: 96% 98%    Full spinal precautions maintained throughout  the trauma exam. Constitutional: Alert and oriented. No acute distress. Does not appear intoxicated. HEENT Head: Normocephalic and atraumatic. Face: No facial bony tenderness. Stable midface Ears: No hemotympanum bilaterally. No Battle sign Eyes: No eye injury. PERRL. No raccoon eyes Nose: Nontender. No epistaxis. No rhinorrhea Mouth/Throat: Mucous membranes are moist. No oropharyngeal blood. No dental injury. Airway patent without stridor. Normal voice. Neck: C-collar. No midline c-spine tenderness.  Cardiovascular: Normal rate, regular rhythm. Normal and symmetric distal pulses are present in all extremities. Pulmonary/Chest: Chest wall is stable and nontender to palpation/compression. Normal respiratory effort. Breath sounds are normal. No crepitus.  Abdominal: Soft, nontender, non distended. Musculoskeletal: Nontender with normal full range of motion in all extremities. No deformities. No thoracic or lumbar midline spinal tenderness. Pelvis is stable. Skin: Skin is warm, dry and intact. No abrasions or contutions. Psychiatric: Speech and behavior are appropriate. Neurological: Normal speech and language. Moves all extremities to command. No gross focal neurologic deficits are appreciated.  Glascow Coma Score: 4 - Opens eyes on own 6 - Follows simple motor commands 5 - Alert and oriented GCS: 15   ED Results / Procedures / Treatments   Labs (all labs ordered are listed, but only abnormal results are displayed) Labs Reviewed - No data to display   EKG  none   RADIOLOGY I, Rudene Re, attending MD, have personally viewed and interpreted the images obtained during this visit as below:  All CTs and x-rays were reviewed by me with no signs of acute injury   ___________________________________________________ Interpretation by Radiologist:  DG Elbow 2 Views Left  Result Date: 03/21/2022 CLINICAL DATA:  Injury from a fall. EXAM: LEFT ELBOW - 2 VIEW COMPARISON:  None  Available. FINDINGS: There is no evidence of fracture, dislocation, or joint effusion. There is no evidence of arthropathy or other focal bone abnormality. Soft tissues are unremarkable. IMPRESSION: Negative. Electronically Signed   By: Lucienne Capers M.D.   On: 03/21/2022 01:48   DG Shoulder Left  Result Date: 03/21/2022 CLINICAL DATA:  Injury from a fall. EXAM: LEFT SHOULDER - 2+ VIEW COMPARISON:  07/30/2020 FINDINGS: Postoperative changes with multiple anchors in the left humeral head. Degenerative changes in the glenohumeral and acromioclavicular joints. Decreased subacromial space likely indicates chronic rotator cuff arthropathy. No evidence of acute fracture or dislocation. Soft tissues are unremarkable. IMPRESSION: Postoperative and degenerative changes in the left shoulder. No acute bony abnormalities. Electronically Signed   By: Lucienne Capers M.D.   On: 03/21/2022 01:47   DG Knee Complete 4 Views Right  Result Date: 03/21/2022 CLINICAL DATA:  Injury after a fall. EXAM: RIGHT KNEE - COMPLETE 4+ VIEW COMPARISON:  None Available. FINDINGS: Degenerative changes in the right knee with lateral greater than medial compartment narrowing and lateral compartment osteophyte formation. No evidence of acute fracture or dislocation. No focal bone lesion or bone destruction. No significant effusions. Vascular calcifications in the soft tissues. IMPRESSION: Mild lateral compartment degenerative changes in the right knee. No acute bony abnormalities. Electronically Signed   By: Lucienne Capers M.D.   On: 03/21/2022 01:46   DG Knee Complete 4 Views Left  Result Date: 03/21/2022 CLINICAL DATA:  Injury after a fall. EXAM: LEFT KNEE - COMPLETE 4+ VIEW COMPARISON:  06/17/2015 FINDINGS: Postoperative changes with left total knee arthroplasty including patellar femoral component. Components appear well seated. No evidence of acute fracture or dislocation. No focal bone lesions. No significant effusions. Vascular  calcifications. IMPRESSION: Postoperative left knee arthroplasty.  No acute bony abnormalities. Electronically Signed   By: Lucienne Capers M.D.   On: 03/21/2022 01:45   CT HEAD WO CONTRAST (5MM)  Result Date: 03/21/2022 CLINICAL DATA:  Trauma. EXAM: CT HEAD WITHOUT CONTRAST CT CERVICAL SPINE WITHOUT CONTRAST TECHNIQUE: Multidetector CT imaging of the head and cervical spine was performed following the standard protocol without intravenous contrast. Multiplanar CT image reconstructions of the cervical spine were also generated. RADIATION DOSE REDUCTION: This exam was performed according to the departmental dose-optimization program which includes automated exposure control, adjustment of the mA and/or kV according to patient size and/or use of iterative reconstruction technique. COMPARISON:  Head CT dated 12/06/2019. FINDINGS: CT HEAD FINDINGS Brain: The ventricles and sulci are appropriate size for the patient's age. The gray-white matter discrimination is preserved. There is no acute intracranial hemorrhage. No mass effect or midline shift. No extra-axial fluid collection. Vascular: No hyperdense vessel or unexpected calcification. Skull: Normal. Negative for fracture or focal lesion. Sinuses/Orbits: No acute finding. Other: None CT  CERVICAL SPINE FINDINGS Alignment: No acute subluxation. Skull base and vertebrae: No acute fracture.  Osteopenia. Soft tissues and spinal canal: No prevertebral fluid or swelling. No visible canal hematoma. Disc levels:  Degenerative changes with C4-C5 ankylosis. Upper chest: Negative. Other: Bilateral carotid bulb calcified plaques. IMPRESSION: 1. No acute intracranial abnormality. 2. No acute/traumatic cervical spine pathology. Electronically Signed   By: Anner Crete M.D.   On: 03/21/2022 01:12   CT Cervical Spine Wo Contrast  Result Date: 03/21/2022 CLINICAL DATA:  Trauma. EXAM: CT HEAD WITHOUT CONTRAST CT CERVICAL SPINE WITHOUT CONTRAST TECHNIQUE: Multidetector CT  imaging of the head and cervical spine was performed following the standard protocol without intravenous contrast. Multiplanar CT image reconstructions of the cervical spine were also generated. RADIATION DOSE REDUCTION: This exam was performed according to the departmental dose-optimization program which includes automated exposure control, adjustment of the mA and/or kV according to patient size and/or use of iterative reconstruction technique. COMPARISON:  Head CT dated 12/06/2019. FINDINGS: CT HEAD FINDINGS Brain: The ventricles and sulci are appropriate size for the patient's age. The gray-white matter discrimination is preserved. There is no acute intracranial hemorrhage. No mass effect or midline shift. No extra-axial fluid collection. Vascular: No hyperdense vessel or unexpected calcification. Skull: Normal. Negative for fracture or focal lesion. Sinuses/Orbits: No acute finding. Other: None CT CERVICAL SPINE FINDINGS Alignment: No acute subluxation. Skull base and vertebrae: No acute fracture.  Osteopenia. Soft tissues and spinal canal: No prevertebral fluid or swelling. No visible canal hematoma. Disc levels:  Degenerative changes with C4-C5 ankylosis. Upper chest: Negative. Other: Bilateral carotid bulb calcified plaques. IMPRESSION: 1. No acute intracranial abnormality. 2. No acute/traumatic cervical spine pathology. Electronically Signed   By: Anner Crete M.D.   On: 03/21/2022 01:12       PROCEDURES:  Critical Care performed: No  Procedures    IMPRESSION / MDM / ASSESSMENT AND PLAN / ED COURSE  I reviewed the triage vital signs and the nursing notes.   81 y.o. female with history of hypercholesterolemia, diabetes, and hyperlipidemia who presents from home for evaluation after mechanical fall.  On exam is extremely well-appearing in no distress initially with a c-collar which was removed after imaging and C-spine was cleared.  Her only complaint at this time is left shoulder pain.   All imaging was reviewed by me with no signs of acute traumatic injury.  Pain is minimal.  Will give sling for comfort.  Patient given 2 extra strength Tylenol for pain.  We discussed supportive care follow-up with primary care doctor.  Patient to be discharged to the care of her daughter who lives with her.   MEDICATIONS GIVEN IN ED: Medications  acetaminophen (TYLENOL) tablet 1,000 mg (has no administration in time range)    EMR reviewed including records from her last visit with her primary care doctor from April 2023 for sinusitis    FINAL CLINICAL IMPRESSION(S) / ED DIAGNOSES   Final diagnoses:  Fall, initial encounter     Rx / DC Orders   ED Discharge Orders     None        Note:  This document was prepared using Dragon voice recognition software and may include unintentional dictation errors.   Please note:  Patient was evaluated in Emergency Department today for the symptoms described in the history of present illness. Patient was evaluated in the context of the global COVID-19 pandemic, which necessitated consideration that the patient might be at risk for infection with the SARS-CoV-2  virus that causes COVID-19. Institutional protocols and algorithms that pertain to the evaluation of patients at risk for COVID-19 are in a state of rapid change based on information released by regulatory bodies including the CDC and federal and state organizations. These policies and algorithms were followed during the patient's care in the ED.  Some ED evaluations and interventions may be delayed as a result of limited staffing during the pandemic.       Alfred Levins, [place], MD 03/21/22 [phone]

## 2022-03-21 NOTE — Discharge Instructions (Signed)

## 2022-03-30 ENCOUNTER — Other Ambulatory Visit: Payer: Self-pay | Admitting: Family Medicine

## 2022-03-30 DIAGNOSIS — E7800 Pure hypercholesterolemia, unspecified: Secondary | ICD-10-CM

## 2022-05-02 ENCOUNTER — Encounter: Payer: Medicare Other | Admitting: Physician Assistant

## 2022-05-02 NOTE — Progress Notes (Deleted)
I,[name],acting as a Education administrator for [employer], PA-C.,have documented all relevant documentation on the behalf of [name], PA-C,as directed by  [name], PA-C while in the presence of [name], PA-C.   Annual Wellness Visit     Patient: Sherry Jones, Female    DOB: 1941-03-08, 81 y.o.   MRN: [id number] Visit Date: 05/02/2022  Today's Provider: [name], PA-C   No chief complaint on file.  Subjective    Sherry Jones is a 81 y.o. female who presents today for her Annual Wellness Visit. She reports consuming a {diet types:17450} diet. {Exercise:19826} She generally feels {well/fairly well/poorly:18703}. She reports sleeping {well/fairly well/poorly:18703}. She {does/does not:200015} have additional problems to discuss today.   HPI    Medications: Outpatient Medications Prior to Visit  Medication Sig   Cholecalciferol (VITAMIN D) 50 MCG (2000 UT) tablet Take 2,000 Units by mouth daily.   [employer] CALCIUM 600 MG tablet Take 1 tablet by mouth 2 (two) times daily with a meal.   fluticasone (FLONASE) 50 MCG/ACT nasal spray Place 2 sprays into both nostrils daily.   ipratropium (ATROVENT) 0.06 % nasal spray Place 2 sprays into both nostrils 4 (four) times daily.   levothyroxine (SYNTHROID) 75 MCG tablet TAKE 1 TABLET BY MOUTH EVERY DAY   loratadine (CLARITIN) 10 MG tablet Take 1 tablet (10 mg total) by mouth at bedtime as needed for allergies.   predniSONE (DELTASONE) 5 MG tablet Take 1 tablet (5 mg total) by mouth daily with breakfast.   simvastatin (ZOCOR) 10 MG tablet TAKE 1 TABLET BY MOUTH EVERYDAY AT BEDTIME   No facility-administered medications prior to visit.    Allergies  Allergen Reactions   Cefadroxil    Cefuroxime    Cefuroxime Axetil     Other reaction(s): Unknown Other reaction(s): NAUSEA   Cephalosporins Other (See Comments)   Ciprofloxacin Other (See Comments)   Codeine Swelling   Hydrocodone-Acetaminophen Other (See Comments)     Other reaction(s): Other (See Comments) Other reaction(s): Unknown Other reaction(s): Other (See Comments)   Levofloxacin Other (See Comments)   Meloxicam    Oxycodone     Other reaction(s): Syncope   Tramadol     Other reaction(s): Unknown   Gabapentin Nausea And Vomiting   Macrolides And Ketolides     Other reaction(s): Other (See Comments) Other Reaction: Intolerance Other reaction(s): Other (See Comments) Other Reaction: Intolerance Other reaction(s): Other (See Comments) Other Reaction: Intolerance Other reaction(s): Other (See Comments) Other Reaction: Intolerance   Methocarbamol Rash   Nitrofuran Derivatives Other (See Comments)    Other Reaction: Intolerance   Nitrofurantoin     Other reaction(s): Other (See Comments) Other Reaction: Intolerance Other reaction(s): Other (See Comments) Other Reaction: Intolerance    Patient Care Team: Gwyneth Sprout, FNP as PCP - General (Family Medicine) [place], [facility] (Optometry) Michaelle Birks Glean Salvo, [facility] (Pharmacist)  Review of Systems  {Labs  Heme  Chem  Endocrine  Serology  Results Review (optional):23779}    Objective    Vitals: There were no vitals taken for this visit. {Show previous vital signs (optional):23777}   Physical Exam ***  Most recent functional status assessment:     No data to display         Most recent fall risk assessment:    04/29/2021   11:24 AM  [place] in the past year? 0  Number falls in past yr: 0  Injury with Fall? 0  Risk for  fall due to : No Fall Risks    Most recent depression screenings:    04/29/2021   11:25 AM 01/26/2021    4:24 PM  PHQ 2/9 Scores  PHQ - 2 Score 0 0  PHQ- 9 Score 0 0   Most recent cognitive screening:    01/28/2019    2:22 PM  6CIT Screen  What Year? 0 points  What month? 0 points  What time? 0 points  Count back from 20 0 points  Months in reverse 0 points  Repeat phrase 0 points  Total Score 0 points   Most recent  Audit-C alcohol use screening    04/29/2021   11:21 AM  Alcohol Use Disorder Test (AUDIT)  1. How often do you have a drink containing alcohol? 0  2. How many drinks containing alcohol do you have on a typical day when you are drinking? 0  3. How often do you have six or more drinks on one occasion? 0  AUDIT-C Score 0   A score of 3 or more in women, and 4 or more in men indicates increased risk for alcohol abuse, EXCEPT if all of the points are from question 1   No results found for any visits on 05/02/22.  Assessment & Plan     Annual wellness visit done today including the all of the following: Reviewed patient's Family Medical History Reviewed and updated list of patient's medical providers Assessment of cognitive impairment was done Assessed patient's functional ability Established a written schedule for health screening [place] Completed and Reviewed  Exercise Activities and Dietary recommendations  Goals      Exercise 3x per week (30 min per time)     Recommend to exercise for 3 days a week for at least 30 minutes at a time.      Increase water intake     Continue drinking 6-8 glasses of water a day.      Manage My Medicine     Timeframe:  Long-Range Goal Priority:  High Start Date: 10/12/2021                            Expected End Date: 10/12/2022                      Follow Up within 90 days   - call for medicine refill 2 or 3 days before it runs out - keep a list of all the medicines I take; vitamins and herbals too - use a pillbox to sort medicine    Why is this important?   These steps will help you keep on track with your medicines.   Notes:         Immunization History  Administered Date(s) Administered   Influenza, High Dose Seasonal PF 09/03/2014, 08/09/2016   Influenza,inj,Quad PF,6+ Mos 06/24/2013   Pneumococcal Conjugate-13 05/14/2014   Pneumococcal Polysaccharide-23 05/27/2011   Td 10/03/1997   Tdap 05/27/2011,  11/21/2015, 12/06/2019   Zoster, Live 02/24/2012    Health Maintenance  Topic Date Due   COVID-19 Vaccine (1) Never done   FOOT EXAM  Never done   Zoster Vaccines- Shingrix (1 of 2) Never done   OPHTHALMOLOGY EXAM  06/03/2017   URINE MICROALBUMIN  04/27/2021   HEMOGLOBIN A1C  11/03/2021   INFLUENZA VACCINE  05/03/2022   DEXA SCAN  05/12/2022   TETANUS/TDAP  12/05/2029   Pneumonia Vaccine 65+ Years  old  Completed   HPV VACCINES  Aged Out     Discussed health benefits of physical activity, and encouraged her to engage in regular exercise appropriate for her age and condition.    ***  No follow-ups on file.     {provider attestation***:1}   [name], PA-C  [facility] [phone] (phone) [phone] (fax)  [place]

## 2022-05-09 NOTE — Progress Notes (Unsigned)
I,[name],acting as a Education administrator for [employer], PA-C.,have documented all relevant documentation on the behalf of [name], PA-C,as directed by  [name], PA-C while in the presence of [name], PA-C.   Annual Wellness Visit     Patient: Sherry Jones, Female    DOB: November 03, 1940, 81 y.o.   MRN: [id number] Visit Date: 05/10/2022  Today's Provider: [name], PA-C   No chief complaint on file.  Subjective    Sherry Jones is a 81 y.o. female who presents today for her Annual Wellness Visit. She reports consuming a {diet types:17450} diet. {Exercise:19826} She generally feels {well/fairly well/poorly:18703}. She reports sleeping {well/fairly well/poorly:18703}. She {does/does not:200015} have additional problems to discuss today.   HPI    Medications: Outpatient Medications Prior to Visit  Medication Sig   Cholecalciferol (VITAMIN D) 50 MCG (2000 UT) tablet Take 2,000 Units by mouth daily.   [employer] CALCIUM 600 MG tablet Take 1 tablet by mouth 2 (two) times daily with a meal.   fluticasone (FLONASE) 50 MCG/ACT nasal spray Place 2 sprays into both nostrils daily.   ipratropium (ATROVENT) 0.06 % nasal spray Place 2 sprays into both nostrils 4 (four) times daily.   levothyroxine (SYNTHROID) 75 MCG tablet TAKE 1 TABLET BY MOUTH EVERY DAY   loratadine (CLARITIN) 10 MG tablet Take 1 tablet (10 mg total) by mouth at bedtime as needed for allergies.   predniSONE (DELTASONE) 5 MG tablet Take 1 tablet (5 mg total) by mouth daily with breakfast.   simvastatin (ZOCOR) 10 MG tablet TAKE 1 TABLET BY MOUTH EVERYDAY AT BEDTIME   No facility-administered medications prior to visit.    Allergies  Allergen Reactions   Cefadroxil    Cefuroxime    Cefuroxime Axetil     Other reaction(s): Unknown Other reaction(s): NAUSEA   Cephalosporins Other (See Comments)   Ciprofloxacin Other (See Comments)   Codeine Swelling   Hydrocodone-Acetaminophen Other (See Comments)     Other reaction(s): Other (See Comments) Other reaction(s): Unknown Other reaction(s): Other (See Comments)   Levofloxacin Other (See Comments)   Meloxicam    Oxycodone     Other reaction(s): Syncope   Tramadol     Other reaction(s): Unknown   Gabapentin Nausea And Vomiting   Macrolides And Ketolides     Other reaction(s): Other (See Comments) Other Reaction: Intolerance Other reaction(s): Other (See Comments) Other Reaction: Intolerance Other reaction(s): Other (See Comments) Other Reaction: Intolerance Other reaction(s): Other (See Comments) Other Reaction: Intolerance   Methocarbamol Rash   Nitrofuran Derivatives Other (See Comments)    Other Reaction: Intolerance   Nitrofurantoin     Other reaction(s): Other (See Comments) Other Reaction: Intolerance Other reaction(s): Other (See Comments) Other Reaction: Intolerance    Patient Care Team: Gwyneth Sprout, FNP as PCP - General (Family Medicine) Pa, [facility] (Optometry) Michaelle Birks Glean Salvo, [facility] (Pharmacist)  Review of Systems  {Labs  Heme  Chem  Endocrine  Serology  Results Review (optional):23779}    Objective    Vitals: There were no vitals taken for this visit. {Show previous vital signs (optional):23777}   Physical Exam ***  Most recent functional status assessment:     No data to display         Most recent fall risk assessment:    04/29/2021   11:24 AM  [place] in the past year? 0  Number falls in past yr: 0  Injury with Fall? 0  Risk for  fall due to : No Fall Risks    Most recent depression screenings:    04/29/2021   11:25 AM 01/26/2021    4:24 PM  PHQ 2/9 Scores  PHQ - 2 Score 0 0  PHQ- 9 Score 0 0   Most recent cognitive screening:    01/28/2019    2:22 PM  6CIT Screen  What Year? 0 points  What month? 0 points  What time? 0 points  Count back from 20 0 points  Months in reverse 0 points  Repeat phrase 0 points  Total Score 0 points   Most recent  Audit-C alcohol use screening    04/29/2021   11:21 AM  Alcohol Use Disorder Test (AUDIT)  1. How often do you have a drink containing alcohol? 0  2. How many drinks containing alcohol do you have on a typical day when you are drinking? 0  3. How often do you have six or more drinks on one occasion? 0  AUDIT-C Score 0   A score of 3 or more in women, and 4 or more in men indicates increased risk for alcohol abuse, EXCEPT if all of the points are from question 1   No results found for any visits on 05/10/22.  Assessment & Plan     Annual wellness visit done today including the all of the following: Reviewed patient's Family Medical History Reviewed and updated list of patient's medical providers Assessment of cognitive impairment was done Assessed patient's functional ability Established a written schedule for health screening [place] Completed and Reviewed  Exercise Activities and Dietary recommendations  Goals      Exercise 3x per week (30 min per time)     Recommend to exercise for 3 days a week for at least 30 minutes at a time.      Increase water intake     Continue drinking 6-8 glasses of water a day.      Manage My Medicine     Timeframe:  Long-Range Goal Priority:  High Start Date: 10/12/2021                            Expected End Date: 10/12/2022                      Follow Up within 90 days   - call for medicine refill 2 or 3 days before it runs out - keep a list of all the medicines I take; vitamins and herbals too - use a pillbox to sort medicine    Why is this important?   These steps will help you keep on track with your medicines.   Notes:         Immunization History  Administered Date(s) Administered   Influenza, High Dose Seasonal PF 09/03/2014, 08/09/2016   Influenza,inj,Quad PF,6+ Mos 06/24/2013   Pneumococcal Conjugate-13 05/14/2014   Pneumococcal Polysaccharide-23 05/27/2011   Td 10/03/1997   Tdap 05/27/2011,  11/21/2015, 12/06/2019   Zoster, Live 02/24/2012    Health Maintenance  Topic Date Due   COVID-19 Vaccine (1) Never done   FOOT EXAM  Never done   Zoster Vaccines- Shingrix (1 of 2) Never done   OPHTHALMOLOGY EXAM  06/03/2017   URINE MICROALBUMIN  04/27/2021   HEMOGLOBIN A1C  11/03/2021   INFLUENZA VACCINE  05/03/2022   DEXA SCAN  05/12/2022   TETANUS/TDAP  12/05/2029   Pneumonia Vaccine 65+ Years  old  Completed   HPV VACCINES  Aged Out     Discussed health benefits of physical activity, and encouraged her to engage in regular exercise appropriate for her age and condition.    ***  No follow-ups on file.     {provider attestation***:1}   [name], PA-C  [facility] [phone] (phone) [phone] (fax)  [place]

## 2022-05-10 ENCOUNTER — Encounter: Payer: Self-pay | Admitting: Physician Assistant

## 2022-05-10 ENCOUNTER — Ambulatory Visit (INDEPENDENT_AMBULATORY_CARE_PROVIDER_SITE_OTHER): Payer: Medicare Other | Admitting: Physician Assistant

## 2022-05-10 VITALS — BP 159/67 | HR 83 | Wt 111.0 lb

## 2022-05-10 DIAGNOSIS — I10 Essential (primary) hypertension: Secondary | ICD-10-CM | POA: Diagnosis not present

## 2022-05-10 DIAGNOSIS — E7800 Pure hypercholesterolemia, unspecified: Secondary | ICD-10-CM | POA: Diagnosis not present

## 2022-05-10 DIAGNOSIS — R7303 Prediabetes: Secondary | ICD-10-CM

## 2022-05-10 DIAGNOSIS — E039 Hypothyroidism, unspecified: Secondary | ICD-10-CM | POA: Diagnosis not present

## 2022-05-10 DIAGNOSIS — Z0000 Encounter for general adult medical examination without abnormal findings: Secondary | ICD-10-CM | POA: Diagnosis not present

## 2022-05-10 DIAGNOSIS — M25512 Pain in left shoulder: Secondary | ICD-10-CM

## 2022-05-10 DIAGNOSIS — J3489 Other specified disorders of nose and nasal sinuses: Secondary | ICD-10-CM | POA: Insufficient documentation

## 2022-05-10 NOTE — Assessment & Plan Note (Signed)
Concerning for a missed fracture vs dislocation vs rotator cuff tear Ref to ortho for further eval

## 2022-05-10 NOTE — Assessment & Plan Note (Signed)
Managed with simvastatin 10 mg Will get fasting lipids today

## 2022-05-10 NOTE — Assessment & Plan Note (Signed)
Pt has consistently had elevated bp on chart review.  Advised her to check at home and f/u in 4 weeks to review

## 2022-05-10 NOTE — Assessment & Plan Note (Signed)
Advised otc saline spray

## 2022-05-10 NOTE — Assessment & Plan Note (Signed)
Chronic stable, will recheck tsh/t4

## 2022-05-10 NOTE — Assessment & Plan Note (Addendum)
Dx on chart was diabetes but since 2015 has been in a pre-dm range with only diet and exercise. No dm meds.  will follow in future as pre-diabetes. Will check a1c today.

## 2022-05-11 LAB — LIPID PANEL
Chol/HDL Ratio: 2.9 ratio (ref 0.0–4.4)
Cholesterol, Total: 205 mg/dL — ABNORMAL HIGH (ref 100–199)
HDL: 71 mg/dL (ref >39)
LDL Chol Calc (NIH): 112 mg/dL — ABNORMAL HIGH (ref 0–99)
Triglycerides: 128 mg/dL (ref 0–149)
VLDL Cholesterol Cal: 22 mg/dL (ref 5–40)

## 2022-05-11 LAB — CBC WITH DIFFERENTIAL/PLATELET
Basophils Absolute: 0.1 10*3/uL (ref 0.0–0.2)
Basos: 1 %
EOS (ABSOLUTE): 0.2 10*3/uL (ref 0.0–0.4)
Eos: 3 %
Hematocrit: 38.6 % (ref 34.0–46.6)
Hemoglobin: 13 g/dL (ref 11.1–15.9)
Immature Grans (Abs): 0 10*3/uL (ref 0.0–0.1)
Immature Granulocytes: 0 %
Lymphocytes Absolute: 1.7 10*3/uL (ref 0.7–3.1)
Lymphs: 26 %
MCH: 30.1 pg (ref 26.6–33.0)
MCHC: 33.7 g/dL (ref 31.5–35.7)
MCV: 89 fL (ref 79–97)
Monocytes Absolute: 0.6 10*3/uL (ref 0.1–0.9)
Monocytes: 9 %
Neutrophils Absolute: 4 10*3/uL (ref 1.4–7.0)
Neutrophils: 61 %
Platelets: 241 10*3/uL (ref 150–450)
RBC: 4.32 x10E6/uL (ref 3.77–5.28)
RDW: 13.6 % (ref 11.7–15.4)
WBC: 6.5 10*3/uL (ref 3.4–10.8)

## 2022-05-11 LAB — HEMOGLOBIN A1C
Est. average glucose Bld gHb Est-mCnc: 120 mg/dL
Hgb A1c MFr Bld: 5.8 % — ABNORMAL HIGH (ref 4.8–5.6)

## 2022-05-11 LAB — COMPREHENSIVE METABOLIC PANEL
ALT: 13 IU/L (ref 0–32)
AST: 19 IU/L (ref 0–40)
Albumin/Globulin Ratio: 1.9 (ref 1.2–2.2)
Albumin: 4.8 g/dL (ref 3.8–4.8)
Alkaline Phosphatase: 56 IU/L (ref 44–121)
BUN/Creatinine Ratio: 14 (ref 12–28)
BUN: 10 mg/dL (ref 8–27)
Bilirubin Total: 0.5 mg/dL (ref 0.0–1.2)
CO2: 24 mmol/L (ref 20–29)
Calcium: 9.5 mg/dL (ref 8.7–10.3)
Chloride: 100 mmol/L (ref 96–106)
Creatinine, Ser: 0.72 mg/dL (ref 0.57–1.00)
Globulin, Total: 2.5 g/dL (ref 1.5–4.5)
Glucose: 107 mg/dL — ABNORMAL HIGH (ref 70–99)
Potassium: 4.3 mmol/L (ref 3.5–5.2)
Sodium: 139 mmol/L (ref 134–144)
Total Protein: 7.3 g/dL (ref 6.0–8.5)
eGFR: 84 mL/min/{1.73_m2} (ref >59)

## 2022-05-11 LAB — TSH+FREE T4
Free T4: 1.57 ng/dL (ref 0.82–1.77)
TSH: 0.554 u[IU]/mL (ref 0.450–4.500)

## 2022-05-13 ENCOUNTER — Telehealth: Payer: Self-pay

## 2022-05-13 NOTE — Telephone Encounter (Signed)
Pt returned our call. Share provider's note with pt.  Pt states that she does not have sugary foods. She will keep working on her diet.    A1c stable at 5.8% again only pre-diabetic.  Cholesterol is slightly higher, see diet recommendations below All other labs normal/stable   Substitutions to help cholesterol:   Substituting soy-based products (such as tofu or tempeh) for any meat in recipes    Substituting a lean meat such as (non-fried) poultry or fish (other than shrimp) for red meat in recipes   Replacing refined grain products with higher-fiber whole-grain products whenever possible   Drinking tea, carbonated water, or plain water instead of sugar-sweetened soft drinks and fruit juices   Using a nut butter spread instead of traditional dairy butter  Written by [name], PA-C on 05/11/2022 12:10 PM ED

## 2022-05-16 DIAGNOSIS — S93401A Sprain of unspecified ligament of right ankle, initial encounter: Secondary | ICD-10-CM | POA: Diagnosis not present

## 2022-05-16 DIAGNOSIS — M24812 Other specific joint derangements of left shoulder, not elsewhere classified: Secondary | ICD-10-CM | POA: Diagnosis not present

## 2022-05-19 ENCOUNTER — Other Ambulatory Visit: Payer: Self-pay | Admitting: Family Medicine

## 2022-06-07 ENCOUNTER — Ambulatory Visit: Payer: Medicare Other | Admitting: Family Medicine

## 2022-06-16 ENCOUNTER — Ambulatory Visit: Payer: Self-pay

## 2022-06-16 NOTE — Telephone Encounter (Signed)
Called patient to let her know we dont have any openings for today, and to advise her to keep her appt for tomorrow. Okay for PEC to advise.

## 2022-06-16 NOTE — Telephone Encounter (Signed)
     Chief Complaint: Rectal bleeding x 2 today. Dark red. Moderate amount.Has appointment tomorrow, asking to be worked in today. Symptoms: Above Frequency: Today Pertinent Negatives: Patient denies constipation, diarrhea. Disposition: '[]'$ ED /'[]'$ Urgent Care (no appt availability in office) / '[x]'$ Appointment(In office/virtual)/ '[]'$  [place] [facility]/ '[]'$ Home Care/ '[]'$ Refused Recommended Disposition /'[]'$ [place] [facility]/ '[]'$  Follow-up with PCP Additional Notes: Please advise pt. Will go to ED for worsening of symptoms.  Reason for Disposition  MILD rectal bleeding (more than just a few drops or streaks)  Answer Assessment - Initial Assessment Questions 1. APPEARANCE of BLOOD: "What color is it?" "Is it passed separately, on the surface of the stool, or mixed in with the stool?"      Dark red, wiped 2. AMOUNT: "How much blood was passed?"      Unsure 3. FREQUENCY: "How many times has blood been passed with the stools?"      X 2 4. ONSET: "When was the blood first seen in the stools?" (Days or weeks)      Today 5. DIARRHEA: "Is there also some diarrhea?" If Yes, ask: "How many diarrhea stools in the past 24 hours?"      No 6. CONSTIPATION: "Do you have constipation?" If Yes, ask: "How bad is it?"     No 7. RECURRENT SYMPTOMS: "Have you had blood in your stools before?" If Yes, ask: "When was the last time?" and "What happened that time?"      No 8. BLOOD THINNERS: "Do you take any blood thinners?" (e.g., Coumadin/warfarin, Pradaxa/dabigatran, aspirin)     No 9. OTHER SYMPTOMS: "Do you have any other symptoms?"  (e.g., abdomen pain, vomiting, dizziness, fever)     No 10. PREGNANCY: "Is there any chance you are pregnant?" "When was your last menstrual period?"       No  Protocols used: Rectal Bleeding-A-AH

## 2022-06-16 NOTE — Telephone Encounter (Signed)
Called, left VM about appointment and to call back for questions.

## 2022-06-17 ENCOUNTER — Ambulatory Visit (INDEPENDENT_AMBULATORY_CARE_PROVIDER_SITE_OTHER): Payer: Medicare Other | Admitting: Physician Assistant

## 2022-06-17 ENCOUNTER — Other Ambulatory Visit: Payer: Self-pay | Admitting: Physician Assistant

## 2022-06-17 ENCOUNTER — Encounter: Payer: Self-pay | Admitting: Physician Assistant

## 2022-06-17 VITALS — BP 122/53 | HR 84 | Ht <= 58 in | Wt 111.0 lb

## 2022-06-17 DIAGNOSIS — K625 Hemorrhage of anus and rectum: Secondary | ICD-10-CM

## 2022-06-17 DIAGNOSIS — K644 Residual hemorrhoidal skin tags: Secondary | ICD-10-CM | POA: Insufficient documentation

## 2022-06-17 MED ORDER — HYDROCORTISONE ACETATE 25 MG RE SUPP: 25.0000 mg | Freq: Two times a day (BID) | RECTAL | 0 refills | Status: DC

## 2022-06-17 MED ORDER — HYDROCORTISONE (PERIANAL) 2.5 % EX CREA: 1.0000 | TOPICAL_CREAM | Freq: Two times a day (BID) | CUTANEOUS | 0 refills | Status: AC

## 2022-06-17 NOTE — Progress Notes (Signed)
     I,[name],acting as a Education administrator for [employer], PA-C.,have documented all relevant documentation on the behalf of Sherry Kirschner, PA-C,as directed by  Sherry Kirschner, PA-C while in the presence of Sherry Kirschner, PA-C.   Established patient visit   Patient: Sherry Jones   DOB: 1941/05/14   81 y.o. Female  MRN: [id number] Visit Date: 06/17/2022  Today's healthcare provider: [name], PA-C   Cc. Rectal bleeding x 2 days  Subjective     Patient reports yesterday blood on toilet paper when wiping herself after bowel movements twice yesterday.  Denies seeing any blood today.  Denies any pain.  When she felt her rectum she felt something that felt like a ball.  Denies any dizziness light headedness syncope.  Denies constipation.    Medications: Outpatient Medications Prior to Visit  Medication Sig   Cholecalciferol (VITAMIN D) 50 MCG (2000 UT) tablet Take 2,000 Units by mouth daily.   CVS CALCIUM 600 MG tablet Take 1 tablet by mouth 2 (two) times daily with a meal.   fluticasone (FLONASE) 50 MCG/ACT nasal spray Place 2 sprays into both nostrils daily.   ipratropium (ATROVENT) 0.06 % nasal spray Place 2 sprays into both nostrils 4 (four) times daily.   levothyroxine (SYNTHROID) 75 MCG tablet TAKE 1 TABLET BY MOUTH EVERY DAY   loratadine (CLARITIN) 10 MG tablet Take 1 tablet (10 mg total) by mouth at bedtime as needed for allergies.   simvastatin (ZOCOR) 10 MG tablet TAKE 1 TABLET BY MOUTH EVERYDAY AT BEDTIME   No facility-administered medications prior to visit.    Review of Systems  Gastrointestinal:  Positive for hematochezia.     Objective    Blood pressure (!) 122/53, pulse 84, height '4\' 4"'$  (1.321 m), weight 111 lb (50.3 kg), SpO2 100 %.   Physical Exam Vitals reviewed.  Constitutional:      Appearance: She is not ill-appearing.  HENT:     Head: Normocephalic.  Eyes:     Conjunctiva/sclera: Conjunctivae normal.  Cardiovascular:     Rate and Rhythm:  Normal rate.  Pulmonary:     Effort: Pulmonary effort is normal. No respiratory distress.  Genitourinary:    Rectum: External hemorrhoid present.  Neurological:     General: No focal deficit present.     Mental Status: She is alert and oriented to person, place, and time.  Psychiatric:        Mood and Affect: Mood normal.        Behavior: Behavior normal.      No results found for any visits on 06/17/22.  Assessment & Plan     Problem List Items Addressed This Visit       Cardiovascular and Mediastinum   External hemorrhoid - Primary    nonthrombosed Pt states she is not constipated-- increased fluids rx hydrocortisone suppository Advised if significant bleeding, pain to call office/urgent care      Relevant Medications   hydrocortisone (ANUSOL-HC) 25 MG suppository     Return if symptoms worsen or fail to improve.      I, Sherry Kirschner, PA-C have reviewed all documentation for this visit. The documentation on  06/17/2022 for the exam, diagnosis, procedures, and orders are all accurate and complete.  Sherry Kirschner, PA-C [facility] [address] [place], [place], [address] Office: [phone] Fax: [place]

## 2022-06-17 NOTE — Assessment & Plan Note (Addendum)
nonthrombosed Pt states she is not constipated-- increased fluids rx hydrocortisone suppository Advised if significant bleeding, pain to call office/urgent care

## 2022-06-28 ENCOUNTER — Other Ambulatory Visit: Payer: Self-pay | Admitting: Family Medicine

## 2022-06-28 DIAGNOSIS — E039 Hypothyroidism, unspecified: Secondary | ICD-10-CM

## 2022-06-28 DIAGNOSIS — E7800 Pure hypercholesterolemia, unspecified: Secondary | ICD-10-CM

## 2022-07-26 ENCOUNTER — Encounter: Payer: Self-pay | Admitting: Physician Assistant

## 2022-07-26 ENCOUNTER — Ambulatory Visit (INDEPENDENT_AMBULATORY_CARE_PROVIDER_SITE_OTHER): Payer: Medicare Other | Admitting: Physician Assistant

## 2022-07-26 VITALS — BP 139/69 | HR 89 | Temp 98.5°F | Resp 16 | Ht <= 58 in | Wt 109.0 lb

## 2022-07-26 DIAGNOSIS — L84 Corns and callosities: Secondary | ICD-10-CM | POA: Diagnosis not present

## 2022-07-26 DIAGNOSIS — R21 Rash and other nonspecific skin eruption: Secondary | ICD-10-CM

## 2022-07-26 DIAGNOSIS — M722 Plantar fascial fibromatosis: Secondary | ICD-10-CM

## 2022-07-26 NOTE — Progress Notes (Unsigned)
     I,Rahm Minix J Dola Lunsford,acting as a Education administrator for [employer], PA-C.,have documented all relevant documentation on the behalf of Mardene Speak, PA-C,as directed by  [employer], PA-C while in the presence of [employer], PA-C.   Established patient visit   Patient: Sherry Jones   DOB: [birth year]   81 y.o. Female  MRN: [id number] Visit Date: 07/26/2022  Today's healthcare provider: Mardene Speak, PA-C   Chief Complaint  Patient presents with   Cough    Patient complains of productive cough, with sinus congestion starting yesterday. Declines covid and flu tests.    Subjective    HPI HPI     Cough    Additional comments: Patient complains of productive cough, with sinus congestion starting yesterday. Declines covid and flu tests.       Last edited by Smitty Knudsen, CMA on 07/26/2022  3:53 PM.      ***  Medications: Outpatient Medications Prior to Visit  Medication Sig   Cholecalciferol (VITAMIN D) 50 MCG (2000 UT) tablet Take 2,000 Units by mouth daily.   CVS CALCIUM 600 MG tablet Take 1 tablet by mouth 2 (two) times daily with a meal.   fluticasone (FLONASE) 50 MCG/ACT nasal spray Place 2 sprays into both nostrils daily.   ipratropium (ATROVENT) 0.06 % nasal spray Place 2 sprays into both nostrils 4 (four) times daily.   levothyroxine (SYNTHROID) 75 MCG tablet Take 1 tablet (75 mcg total) by mouth daily.   loratadine (CLARITIN) 10 MG tablet Take 1 tablet (10 mg total) by mouth at bedtime as needed for allergies.   simvastatin (ZOCOR) 10 MG tablet TAKE 1 TABLET BY MOUTH EVERYDAY AT BEDTIME   No facility-administered medications prior to visit.    Review of Systems  {Labs  Heme  Chem  Endocrine  Serology  Results Review (optional):23779}   Objective    BP 139/69 (BP Location: Right Arm, Patient Position: Sitting, Cuff Size: Normal)   Pulse 89   Temp 98.5 F (36.9 C) (Oral)   Resp 16   Ht '4\' 4"'$  (1.321 m)   Wt 109 lb (49.4 kg)   SpO2 100%   BMI 28.34  kg/m  {Show previous vital signs (optional):23777}  Physical Exam  ***  No results found for any visits on 07/26/22.  Assessment & Plan     ***  No follow-ups on file.      {provider attestation***:1}   Mardene Speak, Hershal Coria  [facility] [phone] (phone) [phone] (fax)  [place]

## 2022-09-06 ENCOUNTER — Ambulatory Visit: Payer: Medicare Other | Admitting: Dermatology

## 2022-09-06 ENCOUNTER — Encounter: Payer: Self-pay | Admitting: Dermatology

## 2022-09-06 DIAGNOSIS — L814 Other melanin hyperpigmentation: Secondary | ICD-10-CM

## 2022-09-06 DIAGNOSIS — L820 Inflamed seborrheic keratosis: Secondary | ICD-10-CM

## 2022-09-06 DIAGNOSIS — L821 Other seborrheic keratosis: Secondary | ICD-10-CM

## 2022-09-06 NOTE — Patient Instructions (Addendum)
Cryotherapy Aftercare  Wash gently with soap and water everyday.   Apply Vaseline daily until healed.     Seborrheic Keratosis  What causes seborrheic keratoses? Seborrheic keratoses are harmless, common skin growths that first appear during adult life.  As time goes by, more growths appear.  Some people may develop a large number of them.  Seborrheic keratoses appear on both covered and uncovered body parts.  They are not caused by sunlight.  The tendency to develop seborrheic keratoses can be inherited.  They vary in color from skin-colored to gray, brown, or even black.  They can be either smooth or have a rough, warty surface.   Seborrheic keratoses are superficial and look as if they were stuck on the skin.  Under the microscope this type of keratosis looks like layers upon layers of skin.  That is why at times the top layer may seem to fall off, but the rest of the growth remains and re-grows.    Treatment Seborrheic keratoses do not need to be treated, but can easily be removed in the office.  Seborrheic keratoses often cause symptoms when they rub on clothing or jewelry.  Lesions can be in the way of shaving.  If they become inflamed, they can cause itching, soreness, or burning.  Removal of a seborrheic keratosis can be accomplished by freezing, burning, or surgery. If any spot bleeds, scabs, or grows rapidly, please return to have it checked, as these can be an indication of a skin cancer.     Due to recent changes in healthcare laws, you may see results of your pathology and/or laboratory studies on MyChart before the doctors have had a chance to review them. We understand that in some cases there may be results that are confusing or concerning to you. Please understand that not all results are received at the same time and often the doctors may need to interpret multiple results in order to provide you with the best plan of care or course of treatment. Therefore, we ask that you  please give us 2 business days to thoroughly review all your results before contacting the office for clarification. Should we see a critical lab result, you will be contacted sooner.   If You Need Anything After Your Visit  If you have any questions or concerns for your doctor, please call our main line at [phone] and press option 4 to reach your doctor's medical assistant. If no one answers, please leave a voicemail as directed and we will return your call as soon as possible. Messages left after 4 pm will be answered the following business day.   You may also send us a message via MyChart. We typically respond to MyChart messages within 1-2 business days.  For prescription refills, please ask your pharmacy to contact our office. Our fax number is [phone].  If you have an urgent issue when the clinic is closed that cannot wait until the next business day, you can page your doctor at the number below.    Please note that while we do our best to be available for urgent issues outside of office hours, we are not available 24/7.   If you have an urgent issue and are unable to reach us, you may choose to seek medical care at your doctor's office, retail clinic, urgent care center, or emergency room.  If you have a medical emergency, please immediately call 911 or go to the emergency department.  Pager Numbers  - Dr. Kowalski:   [phone]  - Dr. Moye: [phone]  - Dr. Stewart: [phone]  In the event of inclement weather, please call our main line at [phone] for an update on the status of any delays or closures.  Dermatology Medication Tips: Please keep the boxes that topical medications come in in order to help keep track of the instructions about where and how to use these. Pharmacies typically print the medication instructions only on the boxes and not directly on the medication tubes.   If your medication is too expensive, please contact our office at  [phone] option 4 or send us a message through MyChart.   We are unable to tell what your co-pay for medications will be in advance as this is different depending on your insurance coverage. However, we may be able to find a substitute medication at lower cost or fill out paperwork to get insurance to cover a needed medication.   If a prior authorization is required to get your medication covered by your insurance company, please allow us 1-2 business days to complete this process.  Drug prices often vary depending on where the prescription is filled and some pharmacies may offer cheaper prices.  The website www.goodrx.com contains coupons for medications through different pharmacies. The prices here do not account for what the cost may be with help from insurance (it may be cheaper with your insurance), but the website can give you the price if you did not use any insurance.  - You can print the associated coupon and take it with your prescription to the pharmacy.  - You may also stop by our office during regular business hours and pick up a GoodRx coupon card.  - If you need your prescription sent electronically to a different pharmacy, notify our office through  MyChart or by phone at [phone] option 4.     Si Usted Necesita Algo Despus de Su Visita  Tambin puede enviarnos un mensaje a travs de MyChart. Por lo general respondemos a los mensajes de MyChart en el transcurso de 1 a 2 das hbiles.  Para renovar recetas, por favor pida a su farmacia que se ponga en contacto con nuestra oficina. Nuestro nmero de fax es el [phone].  Si tiene un asunto urgente cuando la clnica est cerrada y que no puede esperar hasta el siguiente da hbil, puede llamar/localizar a su doctor(a) al nmero que aparece a continuacin.   Por favor, tenga en cuenta que aunque hacemos todo lo posible para estar disponibles para asuntos urgentes fuera del horario de oficina, no estamos  disponibles las 24 horas del da, los 7 das de la semana.   Si tiene un problema urgente y no puede comunicarse con nosotros, puede optar por buscar atencin mdica  en el consultorio de su doctor(a), en una clnica privada, en un centro de atencin urgente o en una sala de emergencias.  Si tiene una emergencia mdica, por favor llame inmediatamente al 911 o vaya a la sala de emergencias.  Nmeros de bper  - Dr. Kowalski: [phone]  - Dra. Moye: [phone]  - Dra. Stewart: [phone]  En caso de inclemencias del tiempo, por favor llame a nuestra lnea principal al [phone] para una actualizacin sobre el estado de cualquier retraso o cierre.  Consejos para la medicacin en dermatologa: Por favor, guarde las cajas en las que vienen los medicamentos de uso tpico para ayudarle a seguir las instrucciones sobre dnde y cmo usarlos. Las farmacias generalmente imprimen las instrucciones del medicamento slo en   las cajas y no directamente en los tubos del medicamento.   Si su medicamento es muy caro, por favor, pngase en contacto con nuestra oficina llamando al [phone] y presione la opcin 4 o envenos un mensaje a travs de MyChart.   No podemos decirle cul ser su copago por los medicamentos por adelantado ya que esto es diferente dependiendo de la cobertura de su seguro. Sin embargo, es posible que podamos encontrar un medicamento sustituto a menor costo o llenar un formulario para que el seguro cubra el medicamento que se considera necesario.   Si se requiere una autorizacin previa para que su compaa de seguros cubra su medicamento, por favor permtanos de 1 a 2 das hbiles para completar este proceso.  Los precios de los medicamentos varan con frecuencia dependiendo del lugar de dnde se surte la receta y alguna farmacias pueden ofrecer precios ms baratos.  El sitio web www.goodrx.com tiene cupones para medicamentos de diferentes farmacias. Los precios aqu no  tienen en cuenta lo que podra costar con la ayuda del seguro (puede ser ms barato con su seguro), pero el sitio web puede darle el precio si no utiliz ningn seguro.  - Puede imprimir el cupn correspondiente y llevarlo con su receta a la farmacia.  - Tambin puede pasar por nuestra oficina durante el horario de atencin regular y recoger una tarjeta de cupones de GoodRx.  - Si necesita que su receta se enve electrnicamente a una farmacia diferente, informe a nuestra oficina a travs de MyChart de [facility] o por telfono llamando al [phone] y presione la opcin 4.  

## 2022-09-06 NOTE — Progress Notes (Unsigned)
   New Patient Visit  Subjective  Sherry Jones is a 81 y.o. female who presents for the following: lesions (C/O rough places on face. Increasing in number, growing. Itching at times. Rubs and scratches areas).  The patient has spots, moles and lesions to be evaluated, some may be new or changing and the patient has concerns that these could be cancer.  Review of Systems: No other skin or systemic complaints except as noted in HPI or Assessment and Plan.   Objective  Well appearing patient in no apparent distress; mood and affect are within normal limits.  A focused examination was performed including face, scalp, hands. Relevant physical exam findings are noted in the Assessment and Plan.  face and scalp x13 (13) Erythematous keratotic or waxy stuck-on papule or plaque.   Assessment & Plan  Inflamed seborrheic keratosis (13) face and scalp x13  Symptomatic, irritating, patient would like treated.  Can take additional treatments for thicker areas.   Benign-appearing.  Observation.  Call clinic for new or changing lesions.    Destruction of lesion - face and scalp x13  Destruction method: cryotherapy   Informed consent: discussed and consent obtained   Lesion destroyed using liquid nitrogen: Yes   Region frozen until ice ball extended beyond lesion: Yes   Outcome: patient tolerated procedure well with no complications   Post-procedure details: wound care instructions given   Additional details:  Prior to procedure, discussed risks of blister formation, small wound, skin dyspigmentation, or rare scar following cryotherapy. Recommend Vaseline ointment to treated areas while healing.    Lentigines - Scattered tan macules - Due to sun exposure - Benign-appearing, observe - Recommend daily broad spectrum sunscreen SPF 30+ to sun-exposed areas, reapply every 2 hours as needed. - Call for any changes  Seborrheic Keratoses - Stuck-on, waxy, tan-brown papules and/or plaques   - Benign-appearing - Discussed benign etiology and prognosis. - Observe - Call for any changes  Return for ISK Follow Up in 4 months.  I, Emelia Salisbury, CMA, am acting as scribe for Forest Gleason, MD.  Documentation: I have reviewed the above documentation for accuracy and completeness, and I agree with the above.  Forest Gleason, MD

## 2022-09-07 ENCOUNTER — Encounter: Payer: Self-pay | Admitting: Dermatology

## 2022-09-26 ENCOUNTER — Other Ambulatory Visit: Payer: Self-pay | Admitting: Family Medicine

## 2022-09-26 DIAGNOSIS — E7800 Pure hypercholesterolemia, unspecified: Secondary | ICD-10-CM

## 2022-10-10 ENCOUNTER — Telehealth: Payer: Medicare Other

## 2022-12-25 ENCOUNTER — Other Ambulatory Visit: Payer: Self-pay | Admitting: Family Medicine

## 2022-12-25 DIAGNOSIS — E7800 Pure hypercholesterolemia, unspecified: Secondary | ICD-10-CM

## 2023-01-12 ENCOUNTER — Ambulatory Visit: Payer: Medicare Other | Admitting: Dermatology

## 2023-02-02 ENCOUNTER — Other Ambulatory Visit: Payer: Self-pay | Admitting: Family Medicine

## 2023-03-15 ENCOUNTER — Ambulatory Visit: Payer: Self-pay

## 2023-03-15 NOTE — Patient Outreach (Signed)
  Care Coordination   03/15/2023 Name: LOUVINA CLEARY MRN: [id number] DOB: Jul 17, 1941   Care Coordination Outreach Attempts:  An unsuccessful telephone outreach was attempted today to offer the patient information about available care coordination services.  Follow Up Plan:  Additional outreach attempts will be made to offer the patient care coordination information and services.   Encounter Outcome:  No Answer   Care Coordination Interventions:  No, not indicated    SIG Lysle Morales, BSW Social Worker [facility] Care Management  [phone]

## 2023-03-24 ENCOUNTER — Other Ambulatory Visit: Payer: Self-pay | Admitting: Family Medicine

## 2023-03-24 DIAGNOSIS — E7800 Pure hypercholesterolemia, unspecified: Secondary | ICD-10-CM

## 2023-03-25 ENCOUNTER — Ambulatory Visit (HOSPITAL_COMMUNITY)
Admission: EM | Admit: 2023-03-25 | Discharge: 2023-03-25 | Disposition: A | Discharge status: Home / Self Care | Payer: Medicare Other | Attending: Emergency Medicine | Admitting: Emergency Medicine

## 2023-03-25 ENCOUNTER — Encounter (HOSPITAL_COMMUNITY): Payer: Self-pay

## 2023-03-25 DIAGNOSIS — L239 Allergic contact dermatitis, unspecified cause: Secondary | ICD-10-CM | POA: Diagnosis not present

## 2023-03-25 MED ORDER — DEXAMETHASONE SODIUM PHOSPHATE 10 MG/ML IJ SOLN
10.0000 mg | Freq: Once | INTRAMUSCULAR | Status: AC
  Administered 2023-03-25: 10 mg via INTRAMUSCULAR

## 2023-03-25 MED ORDER — DEXAMETHASONE SODIUM PHOSPHATE 10 MG/ML IJ SOLN
INTRAMUSCULAR | Status: AC
  Filled 2023-03-25: qty 1

## 2023-03-25 MED ORDER — TRIAMCINOLONE ACETONIDE 0.1 % EX CREA: 1.0000 | TOPICAL_CREAM | Freq: Two times a day (BID) | CUTANEOUS | 0 refills | Status: DC

## 2023-03-25 NOTE — Discharge Instructions (Addendum)
We have given you an IM steroid injection in clinic today for your rash and itching.  You can use the topical Kenalog cream up to twice daily, do not use this for longer than 7 days because it causes skin thinning.  If you have breakthrough itching, you can use Benadryl cream.  If this does not help over the next few days, please return to clinic or follow-up with your primary care provider.  Please return to clinic for any new or urgent symptoms.  Keep moving!!! :)

## 2023-03-25 NOTE — ED Provider Notes (Signed)
[facility]    CSN: [id number] Arrival date & time: 03/25/23  1357      History   Chief Complaint Chief Complaint  Patient presents with   Insect Bite    HPI Sherry Jones is a 82 y.o. female.   Patient is a pleasant 82 year old female who presents to clinic for a rash that has been spreading up her legs.  Around 4 days ago she was dropping off a medication to a friend, standing and talking to her outside, when she noticed some large black ants on her feet.  She stopped them up and went to her car, but the time she got to the car she was itching.  She has tried alcohol and calamine lotion for the itching, despite these interventions, her rash continues to spread up her legs.  Itching has been keeping her up at night.    The history is provided by the patient and medical records.    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   Asymptomatic varicose veins of left lower extremity 09/09/2019   Age-related osteoporosis without current pathological fracture 04/19/2018   H/O non-Hodgkin's lymphoma 01/23/2017   History of deep vein thrombosis (DVT) of lower extremity 01/23/2017   Back pain 08/04/2016   Prediabetes 08/04/2016   Gonalgia 08/25/2015   Contact dermatitis 08/11/2015   Atypical nevus 05/29/2015   Shoulder pain, left 05/29/2015   Hypercholesteremia 05/26/2015   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      Home Medications    Prior to Admission medications   Medication Sig Start Date End Date Taking? Authorizing Provider  Cholecalciferol (VITAMIN D) 50 MCG (2000 UT) tablet Take 2,000 Units by mouth daily.   Yes [provider]  [facility] CALCIUM 600 MG tablet Take 1 tablet by mouth 2 (two) times daily with a meal. 01/15/18  Yes [provider]  fluticasone (FLONASE) 50 MCG/ACT nasal spray Place 2 sprays into both nostrils daily. 01/26/21  Yes Just, Azalee Course, FNP  ipratropium (ATROVENT) 0.06 % nasal spray Place 2 sprays into both nostrils 4 (four) times daily. 01/20/22  Yes Jacky Kindle, FNP  levothyroxine (SYNTHROID) 75 MCG tablet Take 1 tablet (75 mcg total) by mouth daily. 06/28/22  Yes Drubel, Lillia Abed, PA-C  loratadine (CLARITIN) 10 MG tablet Take 1 tablet (10 mg total) by mouth at bedtime as needed for allergies. 01/26/21  Yes Just, Azalee Course, FNP  simvastatin (ZOCOR) 10 MG tablet TAKE 1 TABLET BY MOUTH EVERYDAY AT BEDTIME 03/24/23  Yes Jacky Kindle, FNP  triamcinolone cream (KENALOG) 0.1 % Apply 1 Application topically 2 (two) times daily. 03/25/23  Yes Rinaldo Ratel, [place] N, FNP    Family History Family History  Problem Relation Age of Onset   Alzheimer's disease Mother    Diabetes Brother    Osteoarthritis Brother    Hypertension Son    Heart attack Son        stents placed   Breast cancer Neg Hx     Social History Social History   Tobacco Use   Smoking status: Former    Years: 36    Types: Cigarettes    Quit date: 01/02/1996    Years since  quitting: 27.2   Smokeless tobacco: Never  Vaping Use   Vaping Use: Never used  Substance Use Topics   Alcohol use: No   Drug use: No     Allergies   Cefadroxil, Cefuroxime, Cefuroxime axetil, Ciprofloxacin, Codeine, Hydrocodone-acetaminophen, Levofloxacin, Levothyroxine sodium, Meloxicam, Oxycodone, Tramadol, Cephalosporins, Gabapentin, Hydrocodone-acetaminophen, Macrolides and ketolides, Methocarbamol, Nitrofuran derivatives, and Nitrofurantoin   Review of Systems Review of Systems  Skin:  Positive for rash.     Physical Exam Triage Vital Signs ED Triage Vitals [03/25/23 1438]  Enc Vitals Group     BP (!) 150/70     Pulse Rate 94     Resp 18     Temp 98.1 F (36.7 C)     Temp Source Oral     SpO2 93 %     Weight      Height      Head Circumference      Peak Flow      Pain Score      Pain Loc      Pain Edu?      Excl. in GC?    No data found.  Updated Vital Signs BP (!) 150/70 (BP Location: Left Arm)   Pulse 94   Temp 98.1 F (36.7 C) (Oral)   Resp 18   SpO2 93%   Visual Acuity Right Eye Distance:   Left Eye Distance:   Bilateral Distance:    Right Eye Near:   Left Eye Near:    Bilateral Near:     Physical Exam Vitals and nursing note reviewed.  Constitutional:      Appearance: Normal appearance.  HENT:     Head: Normocephalic and atraumatic.     Right Ear: External ear normal.     Left Ear: External ear normal.     Nose: Nose normal.     Mouth/Throat:     Mouth: Mucous membranes are moist.  Eyes:     General: No scleral icterus. Cardiovascular:     Rate and Rhythm: Normal rate.  Pulmonary:     Effort: Pulmonary effort is normal. No respiratory distress.  Musculoskeletal:        General: No swelling. Normal range of motion.     Right lower leg: No edema.     Left lower leg: No edema.  Skin:    General: Skin is warm and dry.     Capillary Refill: Capillary refill takes less than 2 seconds.     Findings: Rash present. Rash is  urticarial.          Comments: Urticarial erythematous rash that is spreading up her lower legs.  Neurological:     General: No focal deficit present.  Mental Status: She is alert.  Psychiatric:        Mood and Affect: Mood normal.        Behavior: Behavior is cooperative.      UC Treatments / Results  Labs (all labs ordered are listed, but only abnormal results are displayed) Labs Reviewed - No data to display  EKG   Radiology No results found.  Procedures Procedures (including critical care time)  Medications Ordered in UC Medications  dexamethasone (DECADRON) injection 10 mg (has no administration in time range)    Initial Impression / Assessment and Plan / UC Course  I have reviewed the triage vital signs and the nursing notes.  Pertinent labs & imaging results that were available during my care of the patient were reviewed by me and considered in my medical decision making (see chart for details).  Vitals and triage reviewed, patient is hemodynamically stable.  Urticarial erythematous rash spreading up her legs, presumably from bug bites.  Given IM steroid in clinic to help with itching and inflammation, sent home with triamcinolone cream.  Benadryl cream as needed.  There is no streaking, patient is afebrile, low concern for cellulitis or systemic illness at this point.  Plan of care, follow-up care and return precautions given, no questions at this time.     Final Clinical Impressions(s) / UC Diagnoses   Final diagnoses:  Allergic contact dermatitis, unspecified trigger     Discharge Instructions      We have given you an IM steroid injection in clinic today for your rash and itching.  You can use the topical Kenalog cream up to twice daily, do not use this for longer than 7 days because it causes skin thinning.  If you have breakthrough itching, you can use Benadryl cream.  If this does not help over the next few days, please return to clinic or  follow-up with your primary care provider.  Please return to clinic for any new or urgent symptoms.  Keep moving!!! :)      ED Prescriptions     Medication Sig Dispense Auth. Provider   triamcinolone cream (KENALOG) 0.1 % Apply 1 Application topically 2 (two) times daily. 30 g Jaquilla Woodroof, [place], [place]      PDMP not reviewed this encounter.   Teriann Livingood, [place] N, [place] 03/25/23 [phone]

## 2023-03-25 NOTE — ED Triage Notes (Signed)
Pt presents with insect bite on her legs x 2 days.

## 2023-04-02 ENCOUNTER — Other Ambulatory Visit: Payer: Self-pay | Admitting: Family Medicine

## 2023-04-03 NOTE — Telephone Encounter (Signed)
Requested medication (s) are due for refill today:   Requested medication (s) are on the active medication list: Yes  Last refill:    Future visit scheduled: No  Notes to clinic:  Historical provider.    Requested Prescriptions  Pending Prescriptions Disp Refills   Vitamin D, Ergocalciferol, (DRISDOL) 1.25 MG (50000 UNIT) CAPS capsule [Pharmacy Med Name: VITAMIN D2 1.25MG (50,000 UNIT)] 3 capsule     Sig: TAKE 1 CAPSULE (50,000 UNITS TOTAL) BY MOUTH EVERY 30 DAYS     Endocrinology:  Vitamins - Vitamin D Supplementation 2 Failed - 04/02/2023  4:54 PM      Failed - Manual Review: Route requests for 50,000 IU strength to the provider      Failed - Vitamin D in normal range and within 360 days    Vit D, 25-Hydroxy  Date Value Ref Range Status  04/27/2020 38.1 30.0 - 100.0 ng/mL Final    Comment:    Vitamin D deficiency has been defined by the Institute of Medicine and an Endocrine Society practice guideline as a level of serum 25-OH vitamin D less than 20 ng/mL (1,2). The Endocrine Society went on to further define vitamin D insufficiency as a level between 21 and 29 ng/mL (2). 1. IOM (Institute of Medicine). 2010. Dietary reference    intakes for calcium and D. [place]: The    [employer]. 2. Holick MF, Binkley [place], Bischoff-Ferrari HA, et al.    Evaluation, treatment, and prevention of vitamin D    deficiency: an Endocrine Society clinical practice    guideline. JCEM. 2011 Jul; 96(7):1911-30.          Passed - Ca in normal range and within 360 days    Calcium  Date Value Ref Range Status  05/10/2022 9.5 8.7 - 10.3 mg/dL Final   Calcium, Total  Date Value Ref Range Status  10/20/2014 8.3 (L) 8.5 - 10.1 mg/dL Final         Passed - Valid encounter within last 12 months    Recent Outpatient Visits           8 months ago Plantar fasciitis   [facility] [place], [place], PA-C   9 months ago External hemorrhoid   [place]  [facility] Alfredia Ferguson, PA-C   10 months ago Encounter for annual wellness exam in Medicare patient   [facility] [place], [place], PA-C   1 year ago Acute non-recurrent pansinusitis   [facility] Jacky Kindle, FNP   1 year ago Annual physical exam   [facility] Chrismon, Jodell [place], [place]

## 2023-04-05 ENCOUNTER — Ambulatory Visit: Payer: Self-pay

## 2023-04-05 NOTE — Patient Outreach (Signed)
  Care Coordination   04/05/2023 Name: Sherry Jones MRN: [id number] DOB: Jun 28, 1941   Care Coordination Outreach Attempts:  A second unsuccessful outreach was attempted today to offer the patient with information about available care coordination services.  Follow Up Plan:  Additional outreach attempts will be made to offer the patient care coordination information and services.   Encounter Outcome:  No Answer   Care Coordination Interventions:  No, not indicated    SIG Lysle Morales, BSW Social Worker [facility] Care Management  [phone]

## 2023-05-02 ENCOUNTER — Ambulatory Visit (INDEPENDENT_AMBULATORY_CARE_PROVIDER_SITE_OTHER): Payer: Medicare Other

## 2023-05-02 VITALS — BP 122/82 | Ht <= 58 in | Wt 111.9 lb

## 2023-05-02 DIAGNOSIS — Z0000 Encounter for general adult medical examination without abnormal findings: Secondary | ICD-10-CM | POA: Diagnosis not present

## 2023-05-02 DIAGNOSIS — Z0100 Encounter for examination of eyes and vision without abnormal findings: Secondary | ICD-10-CM

## 2023-05-02 DIAGNOSIS — Z13820 Encounter for screening for osteoporosis: Secondary | ICD-10-CM

## 2023-05-02 NOTE — Patient Instructions (Addendum)
Sherry Jones , Thank you for taking time to come for your Medicare Wellness Visit. I appreciate your ongoing commitment to your health goals. Please review the following plan we discussed and let me know if I can assist you in the future.   Referrals/Orders/Follow-Ups/Clinician Recommendations: Dexa Scan   This is a list of the screening recommended for you and due dates:  Health Maintenance  Topic Date Due   COVID-19 Vaccine (1) Never done   Zoster (Shingles) Vaccine (1 of 2) 10/21/1960   Eye exam for diabetics  06/03/2017   Yearly kidney health urinalysis for diabetes  04/27/2021   DEXA scan (bone density measurement)  05/12/2022   Hemoglobin A1C  11/10/2022   Yearly kidney function blood test for diabetes  05/11/2023   Flu Shot  05/04/2023   Medicare Annual Wellness Visit  05/01/2024   DTaP/Tdap/Td vaccine (5 - Td or Tdap) 12/05/2029   Pneumonia Vaccine  Completed   HPV Vaccine  Aged Out   Complete foot exam   Discontinued    Advanced directives: (Declined) Advance directive discussed with you today. Even though you declined this today, please call our office should you change your mind, and we can give you the proper paperwork for you to fill out.  Next Medicare Annual Wellness Visit scheduled for next year: Yes08/02/2024 @ 11:45am  Preventive Care 65 Years and Older, Female Preventive care refers to lifestyle choices and visits with your health care provider that can promote health and wellness. What does preventive care include? A yearly physical exam. This is also called an annual well check. Dental exams once or twice a year. Routine eye exams. Ask your health care provider how often you should have your eyes checked. Personal lifestyle choices, including: Daily care of your teeth and gums. Regular physical activity. Eating a healthy diet. Avoiding tobacco and drug use. Limiting alcohol use. Practicing safe sex. Taking low-dose aspirin every day. Taking vitamin and  mineral supplements as recommended by your health care provider. What happens during an annual well check? The services and screenings done by your health care provider during your annual well check will depend on your age, overall health, lifestyle risk factors, and family history of disease. Counseling  Your health care provider may ask you questions about your: Alcohol use. Tobacco use. Drug use. Emotional well-being. Home and relationship well-being. Sexual activity. Eating habits. History of falls. Memory and ability to understand (cognition). Work and work Astronomer. Reproductive health. Screening  You may have the following tests or measurements: Height, weight, and BMI. Blood pressure. Lipid and cholesterol levels. These may be checked every 5 years, or more frequently if you are over 37 years old. Skin check. Lung cancer screening. You may have this screening every year starting at age 91 if you have a 30-pack-year history of smoking and currently smoke or have quit within the past 15 years. Fecal occult blood test (FOBT) of the stool. You may have this test every year starting at age 96. Flexible sigmoidoscopy or colonoscopy. You may have a sigmoidoscopy every 5 years or a colonoscopy every 10 years starting at age 82. Hepatitis C blood test. Hepatitis B blood test. Sexually transmitted disease (STD) testing. Diabetes screening. This is done by checking your blood sugar (glucose) after you have not eaten for a while (fasting). You may have this done every 1-3 years. Bone density scan. This is done to screen for osteoporosis. You may have this done starting at age 12. Mammogram. This may be  done every 1-2 years. Talk to your health care provider about how often you should have regular mammograms. Talk with your health care provider about your test results, treatment options, and if necessary, the need for more tests. Vaccines  Your health care provider may recommend  certain vaccines, such as: Influenza vaccine. This is recommended every year. Tetanus, diphtheria, and acellular pertussis (Tdap, Td) vaccine. You may need a Td booster every 10 years. Zoster vaccine. You may need this after age 26. Pneumococcal 13-valent conjugate (PCV13) vaccine. One dose is recommended after age 21. Pneumococcal polysaccharide (PPSV23) vaccine. One dose is recommended after age 35. Talk to your health care provider about which screenings and vaccines you need and how often you need them. This information is not intended to replace advice given to you by your health care provider. Make sure you discuss any questions you have with your health care provider. Document Released: 10/16/2015 Document Revised: 06/08/2016 Document Reviewed: 07/21/2015 Elsevier Interactive Patient Education  2017 [employer].  Fall Prevention in the Home Falls can cause injuries. They can happen to people of all ages. There are many things you can do to make your home safe and to help prevent falls. What can I do on the outside of my home? Regularly fix the edges of walkways and driveways and fix any cracks. Remove anything that might make you trip as you walk through a door, such as a raised step or threshold. Trim any bushes or trees on the path to your home. Use bright outdoor lighting. Clear any walking paths of anything that might make someone trip, such as rocks or tools. Regularly check to see if handrails are loose or broken. Make sure that both sides of any steps have handrails. Any raised decks and porches should have guardrails on the edges. Have any leaves, snow, or ice cleared regularly. Use sand or salt on walking paths during winter. Clean up any spills in your garage right away. This includes oil or grease spills. What can I do in the bathroom? Use night lights. Install grab bars by the toilet and in the tub and shower. Do not use towel bars as grab bars. Use non-skid mats or  decals in the tub or shower. If you need to sit down in the shower, use a plastic, non-slip stool. Keep the floor dry. Clean up any water that spills on the floor as soon as it happens. Remove soap buildup in the tub or shower regularly. Attach bath mats securely with double-sided non-slip rug tape. Do not have throw rugs and other things on the floor that can make you trip. What can I do in the bedroom? Use night lights. Make sure that you have a light by your bed that is easy to reach. Do not use any sheets or blankets that are too big for your bed. They should not hang down onto the floor. Have a firm chair that has side arms. You can use this for support while you get dressed. Do not have throw rugs and other things on the floor that can make you trip. What can I do in the kitchen? Clean up any spills right away. Avoid walking on wet floors. Keep items that you use a lot in easy-to-reach places. If you need to reach something above you, use a strong step stool that has a grab bar. Keep electrical cords out of the way. Do not use floor polish or wax that makes floors slippery. If you must use wax,  use non-skid floor wax. Do not have throw rugs and other things on the floor that can make you trip. What can I do with my stairs? Do not leave any items on the stairs. Make sure that there are handrails on both sides of the stairs and use them. Fix handrails that are broken or loose. Make sure that handrails are as long as the stairways. Check any carpeting to make sure that it is firmly attached to the stairs. Fix any carpet that is loose or worn. Avoid having throw rugs at the top or bottom of the stairs. If you do have throw rugs, attach them to the floor with carpet tape. Make sure that you have a light switch at the top of the stairs and the bottom of the stairs. If you do not have them, ask someone to add them for you. What else can I do to help prevent falls? Wear shoes that: Do not  have high heels. Have rubber bottoms. Are comfortable and fit you well. Are closed at the toe. Do not wear sandals. If you use a stepladder: Make sure that it is fully opened. Do not climb a closed stepladder. Make sure that both sides of the stepladder are locked into place. Ask someone to hold it for you, if possible. Clearly mark and make sure that you can see: Any grab bars or handrails. First and last steps. Where the edge of each step is. Use tools that help you move around (mobility aids) if they are needed. These include: Canes. Walkers. Scooters. Crutches. Turn on the lights when you go into a dark area. Replace any light bulbs as soon as they burn out. Set up your furniture so you have a clear path. Avoid moving your furniture around. If any of your floors are uneven, fix them. If there are any pets around you, be aware of where they are. Review your medicines with your doctor. Some medicines can make you feel dizzy. This can increase your chance of falling. Ask your doctor what other things that you can do to help prevent falls. This information is not intended to replace advice given to you by your health care provider. Make sure you discuss any questions you have with your health care provider. Document Released: 07/16/2009 Document Revised: 02/25/2016 Document Reviewed: 10/24/2014 Elsevier Interactive Patient Education  2017 [employer].

## 2023-05-02 NOTE — Progress Notes (Signed)
Subjective:   Sherry Jones is a 82 y.o. female who presents for Medicare Annual (Subsequent) preventive examination.  Visit Complete: In person  Patient Medicare AWV questionnaire was completed by the patient on (not done); I have confirmed that all information answered by patient is correct and no changes since this date.  Review of Systems    Cardiac Risk Factors include: advanced age (>85men, >92 women);dyslipidemia;hypertension    Objective:    Today's Vitals   05/02/23 1125  BP: 122/82  Weight: 111 lb 14.4 oz (50.8 kg)  Height: 4\' 4"  (1.321 m)   Body mass index is 29.1 kg/m.     05/02/2023   11:50 AM 04/10/2021    6:08 PM 10/13/2020   12:29 PM 10/12/2020    3:45 PM 07/30/2020    8:04 PM 04/20/2020    3:06 PM 12/06/2019    1:16 PM  Advanced Directives  Does Patient Have a Medical Advance Directive? No No No No No No No  Would patient like information on creating a medical advance directive?    No - Patient declined  No - Patient declined No - Patient declined    Current Medications (verified) Outpatient Encounter Medications as of 05/02/2023  Medication Sig   Cholecalciferol (VITAMIN D) 50 MCG (2000 UT) tablet Take 2,000 Units by mouth daily.   [employer] CALCIUM 600 MG tablet Take 1 tablet by mouth 2 (two) times daily with a meal.   fluticasone (FLONASE) 50 MCG/ACT nasal spray Place 2 sprays into both nostrils daily.   ipratropium (ATROVENT) 0.06 % nasal spray Place 2 sprays into both nostrils 4 (four) times daily.   levothyroxine (SYNTHROID) 75 MCG tablet Take 1 tablet (75 mcg total) by mouth daily.   loratadine (CLARITIN) 10 MG tablet Take 1 tablet (10 mg total) by mouth at bedtime as needed for allergies.   simvastatin (ZOCOR) 10 MG tablet TAKE 1 TABLET BY MOUTH EVERYDAY AT BEDTIME   triamcinolone cream (KENALOG) 0.1 % Apply 1 Application topically 2 (two) times daily.   No facility-administered encounter medications on file as of 05/02/2023.    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   Lifelong prophylaxis treatment   Squamous cell carcinoma of skin    Thyroid disease    Past Surgical History:  Procedure Laterality Date   ABDOMINAL HYSTERECTOMY     CATARACT EXTRACTION     REPLACEMENT TOTAL KNEE     left knee   ROTATOR CUFF REPAIR Left    Family History  Problem Relation Age of Onset   Alzheimer's disease Mother    Diabetes Brother    Osteoarthritis Brother    Hypertension Son    Heart attack Son        stents placed   Breast cancer Neg Hx    Social History   Socioeconomic History   Marital status: Widowed    Spouse name: Not on file   Number of children: 3   Years of education: Not on file   Highest education level: Some college, no degree  Occupational History   Occupation: retired  Tobacco Use   Smoking status: Former    Current packs/day:  0.00    Types: Cigarettes    Start date: 01/02/1960    Quit date: 01/02/1996    Years since quitting: 27.3   Smokeless tobacco: Never  Vaping Use   Vaping status: Never Used  Substance and Sexual Activity   Alcohol use: No   Drug use: No   Sexual activity: Never  Other Topics Concern   Not on file  Social History Narrative   Oldest son was killed in 2004 from getting hit by a train.    Social Determinants of Health   Financial Resource Strain: Low Risk  (05/02/2023)   Overall Financial Resource Strain (CARDIA)    Difficulty of Paying Living Expenses: Not hard at all  Food Insecurity: No Food  Insecurity (05/02/2023)   Hunger Vital Sign    Worried About Running Out of Food in the Last Year: Never true    Ran Out of Food in the Last Year: Never true  Transportation Needs: No Transportation Needs (05/02/2023)   PRAPARE - Administrator, Civil Service (Medical): No    Lack of Transportation (Non-Medical): No  Physical Activity: Inactive (05/02/2023)   Exercise Vital Sign    Days of Exercise per Week: 0 days    Minutes of Exercise per Session: 0 min  Stress: No Stress Concern Present (05/02/2023)   [employer] of Occupational Health - Occupational Stress Questionnaire    Feeling of Stress : Not at all  Social Connections: Moderately Isolated (05/02/2023)   Social Connection and Isolation Panel [NHANES]    Frequency of Communication with Friends and Family: More than three times a week    Frequency of Social Gatherings with Friends and Family: Once a week    Attends Religious Services: More than 4 times per year    Active Member of [employer] or Organizations: No    Attends Banker Meetings: Never    Marital Status: Widowed    Tobacco Counseling Counseling given: Not Answered   Clinical Intake:  Pre-visit preparation completed: Yes  Pain : No/denies pain     BMI - recorded: 29.1 Nutritional Status: BMI 25 -29 Overweight Nutritional Risks: None Diabetes: No CBG done?: No Did pt. bring in CBG monitor from home?: No  How often do you need to have someone help you when you read instructions, pamphlets, or other written materials from your doctor or pharmacy?: 1 - Never  Interpreter Needed?: No  Comments: lives with daughter and grandson Information entered by :: B.Jayln Branscom,LPN   Activities of Daily Living    05/02/2023   11:50 AM 07/26/2022    3:45 PM  In your present state of health, do you have any difficulty performing the following activities:  Hearing? 1 1  Vision? 0 0  Difficulty concentrating or making decisions?  0  Walking or  climbing stairs? 1 1  Dressing or bathing? 0 0  Doing errands, shopping? 0 0  Preparing Food and eating ? N   Using the Toilet? N   In the past six months, have you accidently leaked urine? N   Do you have problems with loss of bowel control? N   Managing your Medications? N   Managing your Finances? N   Housekeeping or managing your Housekeeping? N     Patient Care Team: Jacky Kindle, FNP as PCP - General (Family Medicine) [place], [place] Day (Optometry) Lauretta Chester, Micael Hampshire, [facility] (Inactive) (Pharmacist)  Indicate any recent Medical Services you may have received from other than [facility] providers  in the past year (date may be approximate).     Assessment:   This is a routine wellness examination for Tonni.  Hearing/Vision screen Hearing Screening - Comments:: Inadequate hearing;cannot hear low sounds to well Vision Screening - Comments:: Adequate vision after cataract surgery Last 5 years ago-referral to eye md to be done this visit   Dietary issues and exercise activities discussed:     Goals Addressed             This Visit's Progress    Exercise 3x per week (30 min per time)   On track    Recommend to exercise for 3 days a week for at least 30 minutes at a time.      Increase water intake   On track    Continue drinking 6-8 glasses of water a day.     Manage My Medicine   On track    Timeframe:  Long-Range Goal Priority:  High Start Date: 10/12/2021                            Expected End Date: 10/12/2022                      Follow Up within 90 days   - call for medicine refill 2 or 3 days before it runs out - keep a list of all the medicines I take; vitamins and herbals too - use a pillbox to sort medicine    Why is this important?   These steps will help you keep on track with your medicines.   Notes:        Depression Screen    05/02/2023   11:44 AM 07/26/2022    3:44 PM 05/10/2022    2:20 PM 04/29/2021   11:25 AM 01/26/2021    4:24 PM 04/20/2020     3:04 PM 01/28/2019    2:16 PM  PHQ 2/9 Scores  PHQ - 2 Score 0 0 0 0 0 0 0  PHQ- 9 Score  0 0 0 0  0    Fall Risk    05/02/2023   11:34 AM 07/26/2022    3:44 PM 05/10/2022    2:20 PM 04/29/2021   11:24 AM 01/26/2021    4:23 PM  Fall Risk   Falls in the past year? 0 1 1 0 0  Number falls in past yr: 0 0 0 0 0  Injury with Fall? 0 1 1 0 0  Risk for fall due to : No Fall Risks  No Fall Risks No Fall Risks   Follow up Education provided;Falls prevention discussed    Falls evaluation completed    MEDICARE RISK AT HOME:  Medicare Risk at Home - 05/02/23 1141     Any stairs in or around the home? Yes   has chair lift   If so, are there any without handrails? Yes    Home free of loose throw rugs in walkways, pet beds, electrical cords, etc? Yes    Adequate lighting in your home to reduce risk of falls? Yes    Life alert? No    Use of a cane, walker or w/c? Yes    Grab bars in the bathroom? Yes    Shower chair or bench in shower? Yes    Elevated toilet seat or a handicapped toilet? No             TIMED UP  AND GO:  Was the test performed?  Yes  Length of time to ambulate 10 feet: 13 sec Gait slow and steady with assistive device    Cognitive Function:        05/02/2023   11:52 AM 01/28/2019    2:22 PM 01/20/2017    9:17 AM  6CIT Screen  What Year? 0 points 0 points 0 points  What month? 0 points 0 points 0 points  What time? 0 points 0 points 0 points  Count back from 20 0 points 0 points 0 points  Months in reverse 0 points 0 points 0 points  Repeat phrase 0 points 0 points 8 points  Total Score 0 points 0 points 8 points    Immunizations Immunization History  Administered Date(s) Administered   Influenza, High Dose Seasonal PF 09/03/2014, 08/09/2016   Influenza,inj,Quad PF,6+ Mos 06/24/2013   Pneumococcal Conjugate-13 05/14/2014   Pneumococcal Polysaccharide-23 05/27/2011   Td 10/03/1997   Tdap 05/27/2011, 11/21/2015, 12/06/2019   Zoster, Live 02/24/2012     TDAP status: Up to date  Flu Vaccine status: Declined, Education has been provided regarding the importance of this vaccine but patient still declined. Advised may receive this vaccine at local pharmacy or Health Dept. Aware to provide a copy of the vaccination record if obtained from local pharmacy or Health Dept. Verbalized acceptance and understanding.  Pneumococcal vaccine status: Up to date  Covid-19 vaccine status: Declined, Education has been provided regarding the importance of this vaccine but patient still declined. Advised may receive this vaccine at local pharmacy or Health Dept.or vaccine clinic. Aware to provide a copy of the vaccination record if obtained from local pharmacy or Health Dept. Verbalized acceptance and understanding.  Qualifies for Shingles Vaccine? Yes   Zostavax completed No   Shingrix Completed?: No.    Education has been provided regarding the importance of this vaccine. Patient has been advised to call insurance company to determine out of pocket expense if they have not yet received this vaccine. Advised may also receive vaccine at local pharmacy or Health Dept. Verbalized acceptance and understanding.  Screening Tests Health Maintenance  Topic Date Due   COVID-19 Vaccine (1) Never done   Zoster Vaccines- Shingrix (1 of 2) 10/21/1959   OPHTHALMOLOGY EXAM  06/03/2017   Diabetic kidney evaluation - Urine ACR  04/27/2021   DEXA SCAN  05/12/2022   HEMOGLOBIN A1C  11/10/2022   Diabetic kidney evaluation - eGFR measurement  05/11/2023   INFLUENZA VACCINE  05/04/2023   Medicare Annual Wellness (AWV)  05/01/2024   DTaP/Tdap/Td (5 - Td or Tdap) 12/05/2029   Pneumonia Vaccine 72+ Years old  Completed   HPV VACCINES  Aged Out   FOOT EXAM  Discontinued    Health Maintenance  Health Maintenance Due  Topic Date Due   COVID-19 Vaccine (1) Never done   Zoster Vaccines- Shingrix (1 of 2) 10[birth year]21/1959   OPHTHALMOLOGY EXAM  06/03/2017   Diabetic kidney  evaluation - Urine ACR  04/27/2021   DEXA SCAN  05/12/2022   HEMOGLOBIN A1C  11/10/2022   Diabetic kidney evaluation - eGFR measurement  05/11/2023    Colorectal cancer screening: No longer required.   Mammogram status: No longer required due to age.  Bone Density status: Ordered tes. Pt provided with contact info and advised to call to schedule appt.  Lung Cancer Screening: (Low Dose CT Chest recommended if Age 39-80 years, 20 pack-year currently smoking OR have quit w/in 15years.) does not qualify.  Lung Cancer Screening Referral: no  Additional Screening:  Hepatitis C Screening: does not qualify; Completed yes  Vision Screening: Recommended annual ophthalmology exams for early detection of glaucoma and other disorders of the eye. Is the patient up to date with their annual eye exam?  No  Who is the provider or what is the name of the office in which the patient attends annual eye exams? none If pt is not established with a provider, would they like to be referred to a provider to establish care? Yes .   Dental Screening: Recommended annual dental exams for proper oral hygiene  Diabetic Foot Exam: n/a  Community Resource Referral / Chronic Care Management: CRR required this visit?  No   CCM required this visit?  Appt scheduled with PCP     Plan:     I have personally reviewed and noted the following in the patient's chart:   Medical and social history Use of alcohol, tobacco or illicit drugs  Current medications and supplements including opioid prescriptions. Patient is not currently taking opioid prescriptions. Functional ability and status Nutritional status Physical activity Advanced directives List of other physicians Hospitalizations, surgeries, and ER visits in previous 12 months Vitals Screenings to include cognitive, depression, and falls Referrals and appointments  In addition, I have reviewed and discussed with patient certain preventive protocols,  quality metrics, and best practice recommendations. A written personalized care plan for preventive services as well as general preventive health recommendations were provided to patient.     Sue Lush, LPN   12/30/5186   After Visit Summary: Printed and given to pt   Nurse Notes: Pt says she is doing well as she has not been sick in years. She does states she has some limitastion lifting her left arm. She says there is a raised area or "knot" on base of shoulder (does not hurt) but thinks it may be causing limitation.  *PE made with PCP *Dexa Scan due next month:order placed.

## 2023-05-03 ENCOUNTER — Telehealth: Payer: Self-pay

## 2023-05-03 NOTE — Telephone Encounter (Signed)
Copied from CRM [id number]. Topic: General - Other >> May 03, 2023  2:06 PM Phill Myron wrote: Returning Sarah's call, patient did not know what it was regarding.

## 2023-05-30 DIAGNOSIS — Z961 Presence of intraocular lens: Secondary | ICD-10-CM | POA: Diagnosis not present

## 2023-05-30 DIAGNOSIS — E119 Type 2 diabetes mellitus without complications: Secondary | ICD-10-CM | POA: Diagnosis not present

## 2023-05-30 LAB — HM DIABETES EYE EXAM

## 2023-06-02 ENCOUNTER — Ambulatory Visit (INDEPENDENT_AMBULATORY_CARE_PROVIDER_SITE_OTHER): Payer: Medicare HMO | Admitting: Family Medicine

## 2023-06-02 ENCOUNTER — Encounter: Payer: Self-pay | Admitting: Family Medicine

## 2023-06-02 VITALS — BP 122/82 | HR 81 | Ht <= 58 in | Wt 110.6 lb

## 2023-06-02 DIAGNOSIS — Z0000 Encounter for general adult medical examination without abnormal findings: Secondary | ICD-10-CM

## 2023-06-02 DIAGNOSIS — R7303 Prediabetes: Secondary | ICD-10-CM | POA: Diagnosis not present

## 2023-06-02 DIAGNOSIS — E118 Type 2 diabetes mellitus with unspecified complications: Secondary | ICD-10-CM | POA: Insufficient documentation

## 2023-06-02 DIAGNOSIS — I152 Hypertension secondary to endocrine disorders: Secondary | ICD-10-CM | POA: Insufficient documentation

## 2023-06-02 DIAGNOSIS — E7800 Pure hypercholesterolemia, unspecified: Secondary | ICD-10-CM | POA: Insufficient documentation

## 2023-06-02 DIAGNOSIS — E039 Hypothyroidism, unspecified: Secondary | ICD-10-CM

## 2023-06-02 DIAGNOSIS — I10 Essential (primary) hypertension: Secondary | ICD-10-CM

## 2023-06-02 DIAGNOSIS — M810 Age-related osteoporosis without current pathological fracture: Secondary | ICD-10-CM

## 2023-06-02 DIAGNOSIS — E1169 Type 2 diabetes mellitus with other specified complication: Secondary | ICD-10-CM | POA: Insufficient documentation

## 2023-06-02 DIAGNOSIS — E559 Vitamin D deficiency, unspecified: Secondary | ICD-10-CM

## 2023-06-02 DIAGNOSIS — Z1231 Encounter for screening mammogram for malignant neoplasm of breast: Secondary | ICD-10-CM | POA: Insufficient documentation

## 2023-06-02 DIAGNOSIS — Z0001 Encounter for general adult medical examination with abnormal findings: Secondary | ICD-10-CM

## 2023-06-02 DIAGNOSIS — E119 Type 2 diabetes mellitus without complications: Secondary | ICD-10-CM | POA: Insufficient documentation

## 2023-06-02 NOTE — Progress Notes (Signed)
Complete physical exam  Patient: Sherry Jones   DOB: Jun 17, 1941   82 y.o. Female  MRN: [id number] Visit Date: 06/02/2023  Today's healthcare provider: Jacky Kindle, FNP  Re-introduced to nurse practitioner role and practice setting.  All questions answered.  Discussed provider/patient relationship and expectations.  Chief Complaint  Patient presents with   Annual Exam    No concerns today    Subjective    Sherry Jones is a 82 y.o. female who presents today for a complete physical exam.  She reports consuming a general diet. The patient does not participate in regular exercise at present. She generally feels well. She reports sleeping well. She does have additional problems to discuss today.  HPI HPI     Annual Exam    Additional comments: No concerns today       Last edited by Rolly Salter, CMA on 06/02/2023 11:07 AM.      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   Lifelong prophylaxis treatment   Squamous cell carcinoma of skin    Thyroid disease    Past Surgical History:  Procedure Laterality Date   ABDOMINAL HYSTERECTOMY     CATARACT EXTRACTION     REPLACEMENT TOTAL KNEE     left knee   ROTATOR CUFF REPAIR Left    Social History   Socioeconomic History   Marital status: Widowed    Spouse name: Not on file   Number of children: 3   Years of education: Not on file   Highest education level: Some college, no degree  Occupational History   Occupation: retired  Tobacco Use   Smoking status: Former    Current packs/day: 0.00    Types: Cigarettes    Start date: 01/02/1960    Quit date: 01/02/1996    Years since quitting: 27.4   Smokeless tobacco: Never  Vaping Use   Vaping status: Never Used  Substance and Sexual Activity   Alcohol use: No   Drug use: No   Sexual activity: Never  Other Topics  Concern   Not on file  Social History Narrative   Oldest son was killed in 2004 from getting hit by a train.    Social Determinants of Health   Financial Resource Strain: Low Risk  (05/02/2023)   Overall Financial Resource Strain (CARDIA)    Difficulty of Paying Living Expenses: Not hard at all  Food Insecurity: No Food Insecurity (05/02/2023)   Hunger Vital Sign    Worried About Running Out of Food in the Last Year: Never true    Ran Out of Food in the Last Year: Never true  Transportation Needs: No Transportation Needs (05/02/2023)   PRAPARE - Administrator, Civil Service (Medical): No    Lack of Transportation (Non-Medical): No  Physical Activity: Inactive (05/02/2023)   Exercise Vital Sign    Days of Exercise per Week: 0 days    Minutes of Exercise per Session: 0 min  Stress: No Stress Concern Present (05/02/2023)   [employer] of Occupational Health - Occupational Stress Questionnaire    Feeling of Stress : Not at all  Social Connections: Moderately Isolated (05/02/2023)   Social Connection and Isolation Panel [NHANES]    Frequency of Communication with Friends and Family: More than three times a week  Frequency of Social Gatherings with Friends and Family: Once a week    Attends Religious Services: More than 4 times per year    Active Member of [employer] or Organizations: No    Attends Banker Meetings: Never    Marital Status: Widowed  Intimate Partner Violence: Not At Risk (05/02/2023)   Humiliation, Afraid, Rape, and Kick questionnaire    Fear of Current or Ex-Partner: No    Emotionally Abused: No    Physically Abused: No    Sexually Abused: No   Family Status  Relation Name Status   Mother  Deceased at age 32       Dementia   Father  Deceased at age 6       pneumonia   Sister  Deceased       trauma   Sister  Deceased at age 68 month old       pneumonia   Brother  (Not Specified)   Brother  (Not Specified)   Daughter  Alive   Son   Deceased       killed after being hit by a train   Son  Alive   Neg Hx  (Not Specified)  No partnership data on file   Family History  Problem Relation Age of Onset   Alzheimer's disease Mother    Diabetes Brother    Osteoarthritis Brother    Hypertension Son    Heart attack Son        stents placed   Breast cancer Neg Hx    Allergies  Allergen Reactions   Cefadroxil    Cefuroxime    Cefuroxime Axetil     Other reaction(s): Unknown Other reaction(s): NAUSEA   Ciprofloxacin Other (See Comments)   Codeine Swelling   Hydrocodone-Acetaminophen Other (See Comments)    Other reaction(s): Other (See Comments) Other reaction(s): Unknown Other reaction(s): Other (See Comments)   Levofloxacin Other (See Comments)   Levothyroxine Sodium Other (See Comments)   Meloxicam    Oxycodone     Other reaction(s): Syncope   Tramadol     Other reaction(s): Unknown   Cephalosporins Other (See Comments) and Rash   Gabapentin Nausea And Vomiting   Hydrocodone-Acetaminophen Rash   Macrolides And Ketolides Other (See Comments)    Other reaction(s): Other (See Comments) Other Reaction: Intolerance Other reaction(s): Other (See Comments) Other Reaction: Intolerance Other reaction(s): Other (See Comments) Other Reaction: Intolerance Other reaction(s): Other (See Comments) Other Reaction: Intolerance Other Reaction: Intolerance Other reaction(s): Other (See Comments) Other Reaction: Intolerance Other reaction(s): Other (See Comments) Other Reaction: Intolerance Other reaction(s): Other (See Comments) Other Reaction: Intolerance Other reaction(s): Other (See Comments) Other Reaction: Intolerance   Methocarbamol Rash   Nitrofuran Derivatives Other (See Comments)    Other Reaction: Intolerance   Nitrofurantoin     Other reaction(s): Other (See Comments) Other Reaction: Intolerance Other reaction(s): Other (See Comments) Other Reaction: Intolerance    Patient Care Team: Jacky Kindle, FNP as PCP - General (Family Medicine) [facility], [facility] (Optometry) Lauretta Chester, Micael Hampshire, [facility] (Inactive) (Pharmacist)   Medications: Outpatient Medications Prior to Visit  Medication Sig   levothyroxine (SYNTHROID) 75 MCG tablet Take 1 tablet (75 mcg total) by mouth daily.   simvastatin (ZOCOR) 10 MG tablet TAKE 1 TABLET BY MOUTH EVERYDAY AT BEDTIME   [DISCONTINUED] Cholecalciferol (VITAMIN D) 50 MCG (2000 UT) tablet Take 2,000 Units by mouth daily.   [DISCONTINUED] [employer] CALCIUM 600 MG tablet Take 1 tablet by mouth 2 (two) times  daily with a meal.   [DISCONTINUED] fluticasone (FLONASE) 50 MCG/ACT nasal spray Place 2 sprays into both nostrils daily. (Patient not taking: Reported on 06/02/2023)   [DISCONTINUED] ipratropium (ATROVENT) 0.06 % nasal spray Place 2 sprays into both nostrils 4 (four) times daily. (Patient not taking: Reported on 06/02/2023)   [DISCONTINUED] loratadine (CLARITIN) 10 MG tablet Take 1 tablet (10 mg total) by mouth at bedtime as needed for allergies. (Patient not taking: Reported on 06/02/2023)   [DISCONTINUED] triamcinolone cream (KENALOG) 0.1 % Apply 1 Application topically 2 (two) times daily. (Patient not taking: Reported on 06/02/2023)   No facility-administered medications prior to visit.    Review of Systems   Objective    BP (!) 168/66 (BP Location: Left Arm, Patient Position: Sitting, Cuff Size: Normal)   Pulse 81   Ht 4\' 4"  (1.321 m)   Wt 110 lb 9.6 oz (50.2 kg)   SpO2 99%   BMI 28.76 kg/m   Physical Exam Vitals and nursing note reviewed.  Constitutional:      General: She is awake. She is not in acute distress.    Appearance: Normal appearance. She is well-developed, well-groomed and overweight. She is not ill-appearing, toxic-appearing or diaphoretic.  HENT:     Head: Normocephalic and atraumatic.     Jaw: There is normal jaw occlusion. No trismus, tenderness, swelling or pain on movement.     Right Ear: Hearing, tympanic membrane, ear canal  and external ear normal. There is no impacted cerumen.     Left Ear: Hearing, tympanic membrane, ear canal and external ear normal. There is no impacted cerumen.     Ears:     Comments: Reports plan to f/u with ENT given hearing loss of low pitch/tone sounds     Nose: Nose normal. No congestion or rhinorrhea.     Right Turbinates: Not enlarged, swollen or pale.     Left Turbinates: Not enlarged, swollen or pale.     Right Sinus: No maxillary sinus tenderness or frontal sinus tenderness.     Left Sinus: No maxillary sinus tenderness or frontal sinus tenderness.     Mouth/Throat:     Lips: Pink.     Mouth: Mucous membranes are moist. No injury.     Tongue: No lesions.     Pharynx: Oropharynx is clear. Uvula midline. No pharyngeal swelling, oropharyngeal exudate, posterior oropharyngeal erythema or uvula swelling.     Tonsils: No tonsillar exudate or tonsillar abscesses.     Comments: Reports upper plate has 4 broken teeth; plans to follow up with dental Eyes:     General: Lids are normal. Lids are everted, no foreign bodies appreciated. Vision grossly intact. Gaze aligned appropriately. No allergic shiner or visual field deficit.       Right eye: No discharge.        Left eye: No discharge.     Extraocular Movements: Extraocular movements intact.     Conjunctiva/sclera: Conjunctivae normal.     Right eye: Right conjunctiva is not injected. No exudate.    Left eye: Left conjunctiva is not injected. No exudate.    Pupils: Pupils are equal, round, and reactive to light.  Neck:     Thyroid: No thyroid mass, thyromegaly or thyroid tenderness.     Vascular: No carotid bruit.     Trachea: Trachea normal.  Cardiovascular:     Rate and Rhythm: Normal rate and regular rhythm.     Pulses: Normal pulses.  Carotid pulses are 2+ on the right side and 2+ on the left side.      Radial pulses are 2+ on the right side and 2+ on the left side.       Dorsalis pedis pulses are 2+ on the right side  and 2+ on the left side.       Posterior tibial pulses are 2+ on the right side and 2+ on the left side.     Heart sounds: Normal heart sounds, S1 normal and S2 normal. No murmur heard.    No friction rub. No gallop.  Pulmonary:     Effort: Pulmonary effort is normal. No respiratory distress.     Breath sounds: Normal breath sounds and air entry. No stridor. No wheezing, rhonchi or rales.  Chest:     Chest wall: No tenderness.  Abdominal:     General: Abdomen is flat. Bowel sounds are normal. There is no distension.     Palpations: Abdomen is soft. There is no mass.     Tenderness: There is no abdominal tenderness. There is no right CVA tenderness, left CVA tenderness, guarding or rebound.     Hernia: No hernia is present.  Genitourinary:    Comments: Exam deferred; denies complaints Musculoskeletal:        General: No swelling, tenderness, deformity or signs of injury. Normal range of motion.     Cervical back: Full passive range of motion without pain, normal range of motion and neck supple. No edema, rigidity or tenderness. No muscular tenderness.     Right lower leg: No edema.     Left lower leg: No edema.  Lymphadenopathy:     Cervical: No cervical adenopathy.     Right cervical: No superficial, deep or posterior cervical adenopathy.    Left cervical: No superficial, deep or posterior cervical adenopathy.  Skin:    General: Skin is warm and dry.     Capillary Refill: Capillary refill takes less than 2 seconds.     Coloration: Skin is not jaundiced or pale.     Findings: No bruising, erythema, lesion or rash.  Neurological:     General: No focal deficit present.     Mental Status: She is alert and oriented to person, place, and time. Mental status is at baseline.     GCS: GCS eye subscore is 4. GCS verbal subscore is 5. GCS motor subscore is 6.     Sensory: Sensation is intact. No sensory deficit.     Motor: Motor function is intact. No weakness.     Coordination: Coordination  is intact. Coordination normal.     Gait: Gait abnormal.     Comments: Iso hip congenital deformity   Psychiatric:        Attention and Perception: Attention and perception normal.        Mood and Affect: Mood and affect normal.        Speech: Speech normal.        Behavior: Behavior normal. Behavior is cooperative.        Thought Content: Thought content normal.        Cognition and Memory: Cognition and memory normal.        Judgment: Judgment normal.     Last depression screening scores    05/02/2023   11:44 AM 07/26/2022    3:44 PM 05/10/2022    2:20 PM  PHQ 2/9 Scores  PHQ - 2 Score 0 0 0  PHQ- 9 Score  0  0   Last fall risk screening    05/02/2023   11:34 AM  Fall Risk   Falls in the past year? 0  Number falls in past yr: 0  Injury with Fall? 0  Risk for fall due to : No Fall Risks  Follow up Education provided;Falls prevention discussed   Last Audit-C alcohol use screening    05/02/2023   11:44 AM  Alcohol Use Disorder Test (AUDIT)  1. How often do you have a drink containing alcohol? 0  2. How many drinks containing alcohol do you have on a typical day when you are drinking? 0  3. How often do you have six or more drinks on one occasion? 0  AUDIT-C Score 0   A score of 3 or more in women, and 4 or more in men indicates increased risk for alcohol abuse, EXCEPT if all of the points are from question 1   No results found for any visits on 06/02/23.  Assessment & Plan    Routine Health Maintenance and Physical Exam  Exercise Activities and Dietary recommendations  Goals      Exercise 3x per week (30 min per time)     Recommend to exercise for 3 days a week for at least 30 minutes at a time.      Increase water intake     Continue drinking 6-8 glasses of water a day.     Manage My Medicine     Timeframe:  Long-Range Goal Priority:  High Start Date: 10/12/2021                            Expected End Date: 10/12/2022                      Follow Up within 90  days   - call for medicine refill 2 or 3 days before it runs out - keep a list of all the medicines I take; vitamins and herbals too - use a pillbox to sort medicine    Why is this important?   These steps will help you keep on track with your medicines.   Notes:         Immunization History  Administered Date(s) Administered   Influenza, High Dose Seasonal PF 09/03/2014, 08/09/2016   Influenza,inj,Quad PF,6+ Mos 06/24/2013   Pneumococcal Conjugate-13 05/14/2014   Pneumococcal Polysaccharide-23 05/27/2011   Td 10/03/1997   Tdap 05/27/2011, 11/21/2015, 12/06/2019   Zoster, Live 02/24/2012    Health Maintenance  Topic Date Due   COVID-19 Vaccine (1) Never done   Zoster Vaccines- Shingrix (1 of 2) 10/21/1960   Diabetic kidney evaluation - Urine ACR  04/27/2021   DEXA SCAN  05/12/2022   HEMOGLOBIN A1C  11/10/2022   Diabetic kidney evaluation - eGFR measurement  05/11/2023   INFLUENZA VACCINE  01/01/2024 (Originally 05/04/2023)   Medicare Annual Wellness (AWV)  05/01/2024   OPHTHALMOLOGY EXAM  05/29/2024   DTaP/Tdap/Td (5 - Td or Tdap) 12/05/2029   Pneumonia Vaccine 64+ Years old  Completed   HPV VACCINES  Aged Out   FOOT EXAM  Discontinued    Discussed health benefits of physical activity, and encouraged her to engage in regular exercise appropriate for her age and condition.  Problem List Items Addressed This Visit       Cardiovascular and Mediastinum   Primary hypertension    Chronic; previously borderline and managed with diet/exercise Pt reports traffic  concerns this morning and feels this is not an accurate reading Continue to recommend goal of 140/90 or less 3 month f/u recommended to assist with BP control and need for oral antihypertensive       Relevant Orders   Comprehensive Metabolic Panel (CMET)   CBC with Differential/Platelet     Endocrine   Acquired hypothyroidism    Chronic, no complaints Remains on 75 mcg synthroid daily Repeat labs to assist  with dose monitoring       Relevant Orders   TSH     Musculoskeletal and Integument   Age-related osteoporosis without current pathological fracture    Chronic, stable Use of cane Recommend Vit D to assist Has DEXA scheduled on 10/16      Relevant Orders   Vitamin D (25 hydroxy)     Other   Annual physical exam - Primary    Plans to f/u with dental and ENT for hearing concerns Things to do to keep yourself healthy  - Exercise at least 30-45 minutes a day, 3-4 days a week.  - Eat a low-fat diet with lots of fruits and vegetables, up to 7-9 servings per day.  - Seatbelts can save your life. Wear them always.  - Smoke detectors on every level of your home, check batteries every year.  - Eye Doctor - have an eye exam every 1-2 years  - Safe sex - if you may be exposed to STDs, use a condom.  - Alcohol -  If you drink, do it moderately, less than 2 drinks per day.  - Health Care Power of Attorney. Choose someone to speak for you if you are not able.  - Depression is common in our stressful world.If you're feeling down or losing interest in things you normally enjoy, please come in for a visit.  - Violence - If anyone is threatening or hurting you, please call immediately.       Avitaminosis D    Chronic with known OP Repeat labs Reports use of unknown strength of supplementation       Relevant Orders   Vitamin D (25 hydroxy)   Hypercholesteremia    Chronic, repeat LP LDL goal <100 to assist with ASCVD risk recommend diet low in saturated fat and regular exercise - 30 min at least 5 times per week Pt continues on zocor 10 mg at this time to assist diet/exercise       Relevant Orders   Lipid panel   Prediabetes    Chronic, unknown Repeat A1c Goal to prevent advancement to T2DM  Continue to recommend balanced, lower carb meals. Smaller meal size, adding snacks. Choosing water as drink of choice and increasing purposeful exercise.       Relevant Orders   Hemoglobin  A1c   Urine Microalbumin w/creat. ratio   Screening mammogram for breast cancer   Relevant Orders   MM 3D SCREENING MAMMOGRAM BILATERAL BREAST   Return in about 3 months (around 09/02/2023) for HTN management, chonic disease management.    Leilani Merl, FNP, have reviewed all documentation for this visit. The documentation on 06/02/23 for the exam, diagnosis, procedures, and orders are all accurate and complete.  Jacky Kindle, FNP  [facility] [phone] (phone) [phone] (fax)  [facility]

## 2023-06-02 NOTE — Assessment & Plan Note (Signed)
Chronic, stable Use of cane Recommend Vit D to assist Has DEXA scheduled on 10/16

## 2023-06-02 NOTE — Assessment & Plan Note (Signed)
Chronic; previously borderline and managed with diet/exercise Pt reports traffic concerns this morning and feels this is not an accurate reading Continue to recommend goal of 140/90 or less 3 month f/u recommended to assist with BP control and need for oral antihypertensive

## 2023-06-02 NOTE — Assessment & Plan Note (Signed)
Chronic, unknown Repeat A1c Goal to prevent advancement to T2DM  Continue to recommend balanced, lower carb meals. Smaller meal size, adding snacks. Choosing water as drink of choice and increasing purposeful exercise.

## 2023-06-02 NOTE — Assessment & Plan Note (Signed)
Chronic with known OP Repeat labs Reports use of unknown strength of supplementation

## 2023-06-02 NOTE — Assessment & Plan Note (Signed)
Chronic, repeat LP LDL goal <100 to assist with ASCVD risk recommend diet low in saturated fat and regular exercise - 30 min at least 5 times per week Pt continues on zocor 10 mg at this time to assist diet/exercise

## 2023-06-02 NOTE — Assessment & Plan Note (Signed)
Plans to f/u with dental and ENT for hearing concerns Things to do to keep yourself healthy  - Exercise at least 30-45 minutes a day, 3-4 days a week.  - Eat a low-fat diet with lots of fruits and vegetables, up to 7-9 servings per day.  - Seatbelts can save your life. Wear them always.  - Smoke detectors on every level of your home, check batteries every year.  - Eye Doctor - have an eye exam every 1-2 years  - Safe sex - if you may be exposed to STDs, use a condom.  - Alcohol -  If you drink, do it moderately, less than 2 drinks per day.  - Health Care Power of Attorney. Choose someone to speak for you if you are not able.  - Depression is common in our stressful world.If you're feeling down or losing interest in things you normally enjoy, please come in for a visit.  - Violence - If anyone is threatening or hurting you, please call immediately.

## 2023-06-02 NOTE — Assessment & Plan Note (Signed)
Chronic, no complaints Remains on 75 mcg synthroid daily Repeat labs to assist with dose monitoring

## 2023-06-18 ENCOUNTER — Other Ambulatory Visit: Payer: Self-pay | Admitting: Family Medicine

## 2023-06-18 DIAGNOSIS — E7800 Pure hypercholesterolemia, unspecified: Secondary | ICD-10-CM

## 2023-06-27 ENCOUNTER — Other Ambulatory Visit: Payer: Self-pay | Admitting: Physician Assistant

## 2023-06-27 DIAGNOSIS — E039 Hypothyroidism, unspecified: Secondary | ICD-10-CM

## 2023-06-30 DIAGNOSIS — M545 Low back pain, unspecified: Secondary | ICD-10-CM | POA: Diagnosis not present

## 2023-07-19 ENCOUNTER — Other Ambulatory Visit: Payer: Self-pay | Admitting: Family Medicine

## 2023-07-19 ENCOUNTER — Ambulatory Visit
Admission: RE | Admit: 2023-07-19 | Discharge: 2023-07-19 | Disposition: A | Discharge status: Home / Self Care | Payer: Medicare Other | Source: Ambulatory Visit | Attending: Family Medicine | Admitting: Family Medicine

## 2023-07-19 ENCOUNTER — Ambulatory Visit
Admission: RE | Admit: 2023-07-19 | Discharge: 2023-07-19 | Disposition: A | Discharge status: Home / Self Care | Payer: Medicare Other | Source: Ambulatory Visit | Attending: Family Medicine

## 2023-07-19 DIAGNOSIS — R92323 Mammographic fibroglandular density, bilateral breasts: Secondary | ICD-10-CM | POA: Diagnosis not present

## 2023-07-19 DIAGNOSIS — Z13820 Encounter for screening for osteoporosis: Secondary | ICD-10-CM | POA: Diagnosis not present

## 2023-07-19 DIAGNOSIS — Z0100 Encounter for examination of eyes and vision without abnormal findings: Secondary | ICD-10-CM

## 2023-07-19 DIAGNOSIS — Z0000 Encounter for general adult medical examination without abnormal findings: Secondary | ICD-10-CM

## 2023-07-19 DIAGNOSIS — M810 Age-related osteoporosis without current pathological fracture: Secondary | ICD-10-CM | POA: Diagnosis not present

## 2023-07-19 DIAGNOSIS — Z1231 Encounter for screening mammogram for malignant neoplasm of breast: Secondary | ICD-10-CM

## 2023-07-19 DIAGNOSIS — Z780 Asymptomatic menopausal state: Secondary | ICD-10-CM | POA: Diagnosis not present

## 2023-07-19 NOTE — Progress Notes (Signed)
Osteoporosis is slightly worse; T score now -2.9 up from -2.6. Continue to recommend Vit D and Calcium to assist weight bearing exercise. Please let us know if you would like to see Endocrinology for injection medication to assist bone health or trial oral fosamax.

## 2023-07-21 ENCOUNTER — Telehealth: Payer: Self-pay

## 2023-07-21 NOTE — Telephone Encounter (Signed)
-----   Message from Jacky Kindle sent at 07/19/2023  2:22 PM EDT ----- Osteoporosis is slightly worse; T score now -2.9 up from -2.6. Continue to recommend Vit D and Calcium to assist weight bearing exercise. Please let us know if you would like to see Endocrinology for injection medication to assist bone health or trial oral fosamax.

## 2023-08-23 DIAGNOSIS — M545 Low back pain, unspecified: Secondary | ICD-10-CM | POA: Diagnosis not present

## 2023-09-05 ENCOUNTER — Ambulatory Visit (INDEPENDENT_AMBULATORY_CARE_PROVIDER_SITE_OTHER): Payer: Medicare Other | Admitting: Family Medicine

## 2023-09-05 DIAGNOSIS — Z91199 Patient's noncompliance with other medical treatment and regimen due to unspecified reason: Secondary | ICD-10-CM

## 2023-09-05 NOTE — Progress Notes (Signed)
Patient was not seen for appt d/t no call, no show, or late arrival >10 mins past appt time.   Elise T Payne, FNP  [facility] [address] [place], [place] [address] [phone] (phone) [phone] (fax) [facility]  

## 2023-09-13 ENCOUNTER — Ambulatory Visit (INDEPENDENT_AMBULATORY_CARE_PROVIDER_SITE_OTHER): Payer: Medicare Other | Admitting: Family Medicine

## 2023-09-13 ENCOUNTER — Encounter: Payer: Self-pay | Admitting: Family Medicine

## 2023-09-13 VITALS — BP 145/60 | HR 96 | Ht <= 58 in | Wt 110.0 lb

## 2023-09-13 DIAGNOSIS — E039 Hypothyroidism, unspecified: Secondary | ICD-10-CM | POA: Diagnosis not present

## 2023-09-13 DIAGNOSIS — E1159 Type 2 diabetes mellitus with other circulatory complications: Secondary | ICD-10-CM

## 2023-09-13 DIAGNOSIS — R030 Elevated blood-pressure reading, without diagnosis of hypertension: Secondary | ICD-10-CM | POA: Insufficient documentation

## 2023-09-13 DIAGNOSIS — E1169 Type 2 diabetes mellitus with other specified complication: Secondary | ICD-10-CM | POA: Diagnosis not present

## 2023-09-13 DIAGNOSIS — I152 Hypertension secondary to endocrine disorders: Secondary | ICD-10-CM | POA: Diagnosis not present

## 2023-09-13 DIAGNOSIS — M810 Age-related osteoporosis without current pathological fracture: Secondary | ICD-10-CM | POA: Diagnosis not present

## 2023-09-13 DIAGNOSIS — Z780 Asymptomatic menopausal state: Secondary | ICD-10-CM | POA: Insufficient documentation

## 2023-09-13 DIAGNOSIS — R7303 Prediabetes: Secondary | ICD-10-CM

## 2023-09-13 DIAGNOSIS — E785 Hyperlipidemia, unspecified: Secondary | ICD-10-CM

## 2023-09-13 DIAGNOSIS — E118 Type 2 diabetes mellitus with unspecified complications: Secondary | ICD-10-CM

## 2023-09-13 DIAGNOSIS — E7800 Pure hypercholesterolemia, unspecified: Secondary | ICD-10-CM | POA: Diagnosis not present

## 2023-09-13 NOTE — Progress Notes (Signed)
Established patient visit   Patient: Sherry Jones   DOB: August 12, 1941   82 y.o. Female  MRN: [id number] Visit Date: 09/13/2023  Today's healthcare provider: Jacky Kindle, FNP  Introduced to nurse practitioner role and practice setting.  All questions answered.  Discussed provider/patient relationship and expectations.  Chief Complaint  Patient presents with   Follow-up   Subjective    HPI   Presents for thyroid follow up; previously on synthroid 75 mcg daily. BP is elevated today; due for repeat labs.  No concerns; looking forward to enjoying her grandchildren and is looking forward to celebrating her 83nd birthday.  Medications: Outpatient Medications Prior to Visit  Medication Sig   levothyroxine (SYNTHROID) 75 MCG tablet TAKE 1 TABLET BY MOUTH EVERY DAY   simvastatin (ZOCOR) 10 MG tablet TAKE 1 TABLET BY MOUTH EVERYDAY AT BEDTIME   No facility-administered medications prior to visit.   Last CBC Lab Results  Component Value Date   WBC 6.6 09/13/2023   HGB 13.2 09/13/2023   HCT 40.0 09/13/2023   MCV 90 09/13/2023   MCH 29.6 09/13/2023   RDW 14.7 09/13/2023   PLT 219 09/13/2023   Last metabolic panel Lab Results  Component Value Date   GLUCOSE 115 (H) 09/13/2023   NA 140 09/13/2023   K 4.3 09/13/2023   CL 102 09/13/2023   CO2 18 (L) 09/13/2023   BUN 14 09/13/2023   CREATININE 0.74 09/13/2023   EGFR 81 09/13/2023   CALCIUM 9.2 09/13/2023   PROT 7.1 09/13/2023   ALBUMIN 4.3 09/13/2023   LABGLOB 2.8 09/13/2023   AGRATIO 1.9 05/10/2022   BILITOT 0.4 09/13/2023   ALKPHOS 62 09/13/2023   AST 20 09/13/2023   ALT 12 09/13/2023   ANIONGAP 6 04/10/2021   Last lipids Lab Results  Component Value Date   CHOL 189 09/13/2023   HDL 76 09/13/2023   LDLCALC 98 09/13/2023   TRIG 85 09/13/2023   CHOLHDL 2.5 09/13/2023   Last hemoglobin A1c Lab Results  Component Value Date   HGBA1C 6.0 (H) 09/13/2023   Last thyroid functions Lab Results  Component  Value Date   TSH 0.868 09/13/2023   Last vitamin D Lab Results  Component Value Date   VD25OH 26.1 (L) 09/13/2023   Last vitamin B12 and Folate No results found for: "VITAMINB12", "FOLATE"     Objective    BP (!) 145/60   Pulse 96   Ht 4\' 4"  (1.321 m)   Wt 110 lb (49.9 kg)   SpO2 97%   BMI 28.60 kg/m   BP Readings from Last 3 Encounters:  09/13/23 (!) 145/60  06/02/23 122/82  05/02/23 122/82   Wt Readings from Last 3 Encounters:  09/13/23 110 lb (49.9 kg)  06/02/23 110 lb 9.6 oz (50.2 kg)  05/02/23 111 lb 14.4 oz (50.8 kg)   SpO2 Readings from Last 3 Encounters:  09/13/23 97%  06/02/23 99%  03/25/23 93%   Physical Exam Vitals and nursing note reviewed.  Constitutional:      General: She is not in acute distress.    Appearance: Normal appearance. She is overweight. She is not ill-appearing, toxic-appearing or diaphoretic.  HENT:     Head: Normocephalic and atraumatic.  Cardiovascular:     Rate and Rhythm: Normal rate and regular rhythm.     Pulses: Normal pulses.     Heart sounds: Normal heart sounds. No murmur heard.    No friction rub. No gallop.  Pulmonary:  Effort: Pulmonary effort is normal. No respiratory distress.     Breath sounds: Normal breath sounds. No stridor. No wheezing, rhonchi or rales.  Chest:     Chest wall: No tenderness.  Musculoskeletal:        General: No swelling, tenderness, deformity or signs of injury. Normal range of motion.     Right lower leg: No edema.     Left lower leg: No edema.  Skin:    General: Skin is warm and dry.     Capillary Refill: Capillary refill takes less than 2 seconds.     Coloration: Skin is not jaundiced or pale.     Findings: No bruising, erythema, lesion or rash.  Neurological:     General: No focal deficit present.     Mental Status: She is alert and oriented to person, place, and time. Mental status is at baseline.     Cranial Nerves: No cranial nerve deficit.     Sensory: No sensory deficit.      Motor: No weakness.     Coordination: Coordination normal.  Psychiatric:        Mood and Affect: Mood normal.        Behavior: Behavior normal.        Thought Content: Thought content normal.        Judgment: Judgment normal.     Results for orders placed or performed in visit on 09/13/23  CBC with Differential/Platelet  Result Value Ref Range   WBC 6.6 3.4 - 10.8 x10E3/uL   RBC 4.46 3.77 - 5.28 x10E6/uL   Hemoglobin 13.2 11.1 - 15.9 g/dL   Hematocrit 86.5 78.4 - 46.6 %   MCV 90 79 - 97 fL   MCH 29.6 26.6 - 33.0 pg   MCHC 33.0 31.5 - 35.7 g/dL   RDW 69.6 29.5 - 28.4 %   Platelets 219 150 - 450 x10E3/uL   Neutrophils 62 Not Estab. %   Lymphs 25 Not Estab. %   Monocytes 9 Not Estab. %   Eos 3 Not Estab. %   Basos 1 Not Estab. %   Neutrophils Absolute 4.1 1.4 - 7.0 x10E3/uL   Lymphocytes Absolute 1.6 0.7 - 3.1 x10E3/uL   Monocytes Absolute 0.6 0.1 - 0.9 x10E3/uL   EOS (ABSOLUTE) 0.2 0.0 - 0.4 x10E3/uL   Basophils Absolute 0.1 0.0 - 0.2 x10E3/uL   Immature Granulocytes 0 Not Estab. %   Immature Grans (Abs) 0.0 0.0 - 0.1 x10E3/uL  Comprehensive Metabolic Panel (CMET)  Result Value Ref Range   Glucose 115 (H) 70 - 99 mg/dL   BUN 14 8 - 27 mg/dL   Creatinine, Ser 1.32 0.57 - 1.00 mg/dL   eGFR 81 >44 WN/UUV/2.53   BUN/Creatinine Ratio 19 12 - 28   Sodium 140 134 - 144 mmol/L   Potassium 4.3 3.5 - 5.2 mmol/L   Chloride 102 96 - 106 mmol/L   CO2 18 (L) 20 - 29 mmol/L   Calcium 9.2 8.7 - 10.3 mg/dL   Total Protein 7.1 6.0 - 8.5 g/dL   Albumin 4.3 3.7 - 4.7 g/dL   Globulin, Total 2.8 1.5 - 4.5 g/dL   Bilirubin Total 0.4 0.0 - 1.2 mg/dL   Alkaline Phosphatase 62 44 - 121 IU/L   AST 20 0 - 40 IU/L   ALT 12 0 - 32 IU/L  TSH  Result Value Ref Range   TSH 0.868 0.450 - 4.500 uIU/mL  Hemoglobin A1c  Result Value Ref Range  Hgb A1c MFr Bld 6.0 (H) 4.8 - 5.6 %   Est. average glucose Bld gHb Est-mCnc 126 mg/dL  Lipid panel  Result Value Ref Range   Cholesterol, Total 189  100 - 199 mg/dL   Triglycerides 85 0 - 149 mg/dL   HDL 76 >40 mg/dL   VLDL Cholesterol Cal 15 5 - 40 mg/dL   LDL Chol Calc (NIH) 98 0 - 99 mg/dL   Chol/HDL Ratio 2.5 0.0 - 4.4 ratio  Vitamin D (25 hydroxy)  Result Value Ref Range   Vit D, 25-Hydroxy 26.1 (L) 30.0 - 100.0 ng/mL  Urine Microalbumin w/creat. ratio  Result Value Ref Range   Creatinine, Urine 68.9 Not Estab. mg/dL   Microalbumin, Urine 98.1 Not Estab. ug/mL   Microalb/Creat Ratio 21 0 - 29 mg/g creat    Assessment & Plan     Problem List Items Addressed This Visit       Cardiovascular and Mediastinum   Hypertension associated with diabetes (HCC)   Chronic, elevated today Goal remains 119/79; however, given age and r/f falls- recommend less strict control Consider start of lisinopril to assist        Endocrine   Acquired hypothyroidism - Primary   Chronic, previously stable on synthroid 75 mcg Repeat labs       Relevant Orders   TSH (Completed)   Controlled type 2 diabetes mellitus with complication, without long-term current use of insulin (HCC)   Chronic, stable Repeat A1c Continue to recommend balanced, lower carb meals. Smaller meal size, adding snacks. Choosing water as drink of choice and increasing purposeful exercise. Controlled with diet/exercise      Hyperlipidemia associated with type 2 diabetes mellitus (HCC)   Chronic, remains on zocor 10 Discussed age and risk vs benefit of continued use recommend diet low in saturated fat and regular exercise - 30 min at least 5 times per week Last LDL at 112 Goal remains 70        Musculoskeletal and Integument   Age-related osteoporosis without current pathological fracture   Chronic, stable Continue to monitor Use of cane      Relevant Orders   Vitamin D (25 hydroxy) (Completed)     Other   RESOLVED: Elevated blood pressure reading   Relevant Orders   CBC with Differential/Platelet (Completed)   Comprehensive Metabolic Panel (CMET)  (Completed)   TSH (Completed)   RESOLVED: Hypercholesteremia   Relevant Orders   Lipid panel (Completed)   RESOLVED: Prediabetes   Relevant Orders   Hemoglobin A1c (Completed)   Urine Microalbumin w/creat. ratio (Completed)   Return in about 3 months (around 12/12/2023), or if symptoms worsen or fail to improve, for chonic disease management.     Leilani Merl, FNP, have reviewed all documentation for this visit. The documentation on 09/17/23 for the exam, diagnosis, procedures, and orders are all accurate and complete.  Jacky Kindle, FNP  [facility] [phone] (phone) [phone] (fax)  [facility]

## 2023-09-13 NOTE — Patient Instructions (Signed)
The CDC recommends two doses of Shingrix (the new shingles vaccine) separated by 2 to 6 months for adults age 81 years and older. I recommend checking with your insurance plan regarding coverage for this vaccine.    

## 2023-09-14 LAB — COMPREHENSIVE METABOLIC PANEL
ALT: 12 [IU]/L (ref 0–32)
AST: 20 [IU]/L (ref 0–40)
Albumin: 4.3 g/dL (ref 3.7–4.7)
Alkaline Phosphatase: 62 [IU]/L (ref 44–121)
BUN/Creatinine Ratio: 19 (ref 12–28)
BUN: 14 mg/dL (ref 8–27)
Bilirubin Total: 0.4 mg/dL (ref 0.0–1.2)
CO2: 18 mmol/L — ABNORMAL LOW (ref 20–29)
Calcium: 9.2 mg/dL (ref 8.7–10.3)
Chloride: 102 mmol/L (ref 96–106)
Creatinine, Ser: 0.74 mg/dL (ref 0.57–1.00)
Globulin, Total: 2.8 g/dL (ref 1.5–4.5)
Glucose: 115 mg/dL — ABNORMAL HIGH (ref 70–99)
Potassium: 4.3 mmol/L (ref 3.5–5.2)
Sodium: 140 mmol/L (ref 134–144)
Total Protein: 7.1 g/dL (ref 6.0–8.5)
eGFR: 81 mL/min/{1.73_m2} (ref >59)

## 2023-09-14 LAB — CBC WITH DIFFERENTIAL/PLATELET
Basophils Absolute: 0.1 10*3/uL (ref 0.0–0.2)
Basos: 1 %
EOS (ABSOLUTE): 0.2 10*3/uL (ref 0.0–0.4)
Eos: 3 %
Hematocrit: 40 % (ref 34.0–46.6)
Hemoglobin: 13.2 g/dL (ref 11.1–15.9)
Immature Grans (Abs): 0 10*3/uL (ref 0.0–0.1)
Immature Granulocytes: 0 %
Lymphocytes Absolute: 1.6 10*3/uL (ref 0.7–3.1)
Lymphs: 25 %
MCH: 29.6 pg (ref 26.6–33.0)
MCHC: 33 g/dL (ref 31.5–35.7)
MCV: 90 fL (ref 79–97)
Monocytes Absolute: 0.6 10*3/uL (ref 0.1–0.9)
Monocytes: 9 %
Neutrophils Absolute: 4.1 10*3/uL (ref 1.4–7.0)
Neutrophils: 62 %
Platelets: 219 10*3/uL (ref 150–450)
RBC: 4.46 x10E6/uL (ref 3.77–5.28)
RDW: 14.7 % (ref 11.7–15.4)
WBC: 6.6 10*3/uL (ref 3.4–10.8)

## 2023-09-14 LAB — VITAMIN D 25 HYDROXY (VIT D DEFICIENCY, FRACTURES): Vit D, 25-Hydroxy: 26.1 ng/mL — ABNORMAL LOW (ref 30.0–100.0)

## 2023-09-14 LAB — MICROALBUMIN / CREATININE URINE RATIO
Creatinine, Urine: 68.9 mg/dL
Microalb/Creat Ratio: 21 mg/g{creat} (ref 0–29)
Microalbumin, Urine: 14.3 ug/mL

## 2023-09-14 LAB — HEMOGLOBIN A1C
Est. average glucose Bld gHb Est-mCnc: 126 mg/dL
Hgb A1c MFr Bld: 6 % — ABNORMAL HIGH (ref 4.8–5.6)

## 2023-09-14 LAB — LIPID PANEL
Chol/HDL Ratio: 2.5 {ratio} (ref 0.0–4.4)
Cholesterol, Total: 189 mg/dL (ref 100–199)
HDL: 76 mg/dL (ref >39)
LDL Chol Calc (NIH): 98 mg/dL (ref 0–99)
Triglycerides: 85 mg/dL (ref 0–149)
VLDL Cholesterol Cal: 15 mg/dL (ref 5–40)

## 2023-09-14 LAB — TSH: TSH: 0.868 u[IU]/mL (ref 0.450–4.500)

## 2023-09-14 NOTE — Progress Notes (Signed)
Labs stable; recommend Vit D 5000 international units daily with meal.

## 2023-09-17 NOTE — Assessment & Plan Note (Signed)
Chronic, elevated today Goal remains 119/79; however, given age and r/f falls- recommend less strict control Consider start of lisinopril to assist

## 2023-09-17 NOTE — Assessment & Plan Note (Signed)
Chronic, stable Continue to monitor Use of cane

## 2023-09-17 NOTE — Assessment & Plan Note (Signed)
Chronic, previously stable on synthroid 75 mcg Repeat labs

## 2023-09-17 NOTE — Assessment & Plan Note (Signed)
Chronic, remains on zocor 10 Discussed age and risk vs benefit of continued use recommend diet low in saturated fat and regular exercise - 30 min at least 5 times per week Last LDL at 112 Goal remains 70

## 2023-09-17 NOTE — Assessment & Plan Note (Signed)
 Chronic, stable Repeat A1c Continue to recommend balanced, lower carb meals. Smaller meal size, adding snacks. Choosing water as drink of choice and increasing purposeful exercise. Controlled with diet/exercise

## 2023-09-22 ENCOUNTER — Other Ambulatory Visit: Payer: Self-pay | Admitting: Family Medicine

## 2023-09-22 DIAGNOSIS — E7800 Pure hypercholesterolemia, unspecified: Secondary | ICD-10-CM

## 2023-09-22 NOTE — Telephone Encounter (Signed)
Requested Prescriptions  Pending Prescriptions Disp Refills   simvastatin (ZOCOR) 10 MG tablet [Pharmacy Med Name: SIMVASTATIN 10 MG TABLET] 90 tablet 0    Sig: TAKE 1 TABLET BY MOUTH EVERYDAY AT BEDTIME     Cardiovascular:  Antilipid - Statins Failed - 09/22/2023 11:37 AM      Failed - Lipid Panel in normal range within the last 12 months    Cholesterol, Total  Date Value Ref Range Status  09/13/2023 189 100 - 199 mg/dL Final   LDL Chol Calc (NIH)  Date Value Ref Range Status  09/13/2023 98 0 - 99 mg/dL Final   HDL  Date Value Ref Range Status  09/13/2023 76 >39 mg/dL Final   Triglycerides  Date Value Ref Range Status  09/13/2023 85 0 - 149 mg/dL Final         Passed - Patient is not pregnant      Passed - Valid encounter within last 12 months    Recent Outpatient Visits           1 week ago Acquired hypothyroidism   [facility] Jacky Kindle, FNP   2 weeks ago No-show for appointment   [facility] Jacky Kindle, FNP   3 months ago Annual physical exam   [facility] Jacky Kindle, FNP   1 year ago Plantar fasciitis   [facility] [place], [place], PA-C   1 year ago External hemorrhoid   [facility] Alfredia Ferguson, PA-C       Future Appointments             In 2 months Pardue, Monico Blitz, DO [place] [facility]

## 2023-12-12 ENCOUNTER — Ambulatory Visit: Payer: Self-pay | Admitting: Family Medicine

## 2024-01-08 ENCOUNTER — Ambulatory Visit: Admitting: Family Medicine

## 2024-01-15 NOTE — Progress Notes (Signed)
 [facility] Quality Team Note  Name: Sherry Jones Date of Birth: [birth year] MRN: [id number] Date: 01/15/2024  [facility] Quality Team has reviewed this patient's chart, please see recommendations below:  [facility] Quality Other; (Patient is due for KED labs. Patient needs URINE Microalbumin/Creatinine Ratio as well as eGF test completed in 2025 for gap closure. Patient has upcoming appt 02/02/2024. Please address if time allows.)

## 2024-01-22 NOTE — Progress Notes (Signed)
Please msg below

## 2024-02-02 ENCOUNTER — Ambulatory Visit (INDEPENDENT_AMBULATORY_CARE_PROVIDER_SITE_OTHER): Admitting: Family Medicine

## 2024-02-02 ENCOUNTER — Encounter: Payer: Self-pay | Admitting: Family Medicine

## 2024-02-02 VITALS — BP 137/51 | HR 60 | Ht <= 58 in | Wt 111.9 lb

## 2024-02-02 DIAGNOSIS — E559 Vitamin D deficiency, unspecified: Secondary | ICD-10-CM | POA: Diagnosis not present

## 2024-02-02 DIAGNOSIS — I6523 Occlusion and stenosis of bilateral carotid arteries: Secondary | ICD-10-CM

## 2024-02-02 DIAGNOSIS — E1159 Type 2 diabetes mellitus with other circulatory complications: Secondary | ICD-10-CM | POA: Diagnosis not present

## 2024-02-02 DIAGNOSIS — I152 Hypertension secondary to endocrine disorders: Secondary | ICD-10-CM

## 2024-02-02 DIAGNOSIS — E785 Hyperlipidemia, unspecified: Secondary | ICD-10-CM | POA: Diagnosis not present

## 2024-02-02 DIAGNOSIS — E1169 Type 2 diabetes mellitus with other specified complication: Secondary | ICD-10-CM

## 2024-02-02 DIAGNOSIS — E7800 Pure hypercholesterolemia, unspecified: Secondary | ICD-10-CM

## 2024-02-02 DIAGNOSIS — R0989 Other specified symptoms and signs involving the circulatory and respiratory systems: Secondary | ICD-10-CM

## 2024-02-02 DIAGNOSIS — E119 Type 2 diabetes mellitus without complications: Secondary | ICD-10-CM

## 2024-02-02 DIAGNOSIS — Z09 Encounter for follow-up examination after completed treatment for conditions other than malignant neoplasm: Secondary | ICD-10-CM | POA: Diagnosis not present

## 2024-02-02 DIAGNOSIS — E039 Hypothyroidism, unspecified: Secondary | ICD-10-CM | POA: Diagnosis not present

## 2024-02-02 MED ORDER — SIMVASTATIN 10 MG PO TABS
10.0000 mg | ORAL_TABLET | Freq: Every day | ORAL | 3 refills | Status: AC
Start: 2024-02-02 — End: ?

## 2024-02-02 NOTE — Progress Notes (Unsigned)
 Established patient visit   Patient: Sherry Jones   DOB: 1941-06-30   83 y.o. Female  MRN: [id number] Visit Date: 02/02/2024  Today's healthcare provider: Carlean Charter, DO   Chief Complaint  Patient presents with   Medical Management of Chronic Issues    Pt reports taking medications as prescribed with no symptoms to report    Hyperlipidemia   Hypothyroidism   Subjective    Hyperlipidemia Pertinent negatives include no chest pain or shortness of breath.   Last annual exam 06/02/2023  Sherry Jones is an 83 year old female who presents for a routine follow-up visit.  She maintains a long-standing sleep pattern, going to bed after midnight and waking up between 7:00 and 7:30 AM. She does not require much sleep and does not nap during the day. She remains active, babysitting her great-grandchildren (2 Yo, 8 Yo, and 10 Yo) and engaging in social activities with friends.  She still drives.  She was diagnosed with diabetes in 2010 (based on her record) but has never taken medication for it and has not experienced symptoms typically associated with diabetes. She has been mindful of her diet, reducing sugar intake, and switching to sugar-free options like Sprite Zero and sugar-free ice cream. She mentions eating sweets occasionally without any adverse effects.  She has had no A1c's outside of prediabetic range in the last 10 years.  Her record does not go back far enough to verify if she was ever actually in diabetic range.  She takes vitamin D  and vitamin C supplements, although she recently resumed vitamin D  after running out. She is unsure of the dosage. She also takes levothyroxine  and simvastatin , though she is not certain of the reasons for these medications.  She remains physically active, walking regularly with a cane but otherwise without difficulty. She had right knee surgery in the past, which did not heal properly, leading to occasional falls and the use of a cane.  She  has not received the shingles vaccine or the COVID booster this year and has not experienced any illnesses, including colds, over the winter.   ***     Medications: Outpatient Medications Prior to Visit  Medication Sig   levothyroxine  (SYNTHROID ) 75 MCG tablet TAKE 1 TABLET BY MOUTH EVERY DAY   simvastatin  (ZOCOR ) 10 MG tablet TAKE 1 TABLET BY MOUTH EVERYDAY AT BEDTIME   No facility-administered medications prior to visit.    Review of Systems  Constitutional:  Negative for appetite change, chills, fatigue and fever.  Respiratory:  Negative for chest tightness and shortness of breath.   Cardiovascular:  Negative for chest pain and palpitations.  Gastrointestinal:  Negative for abdominal pain, nausea and vomiting.  Neurological:  Negative for dizziness and weakness.   ***  {Insert previous labs (optional):23779} {See past labs  Heme  Chem  Endocrine  Serology  Results Review (optional):1}   Objective    BP (!) 163/53   Pulse 66   Ht 4\' 4"  (1.321 m)   Wt 111 lb 14.4 oz (50.8 kg)   SpO2 98%   BMI 29.10 kg/m  {Insert last BP/Wt (optional):23777}{See vitals history (optional):1}   Physical Exam Constitutional:      Appearance: Normal appearance.  HENT:     Head: Normocephalic and atraumatic.  Eyes:     General: No scleral icterus.    Extraocular Movements: Extraocular movements intact.     Conjunctiva/sclera: Conjunctivae normal.  Neck:     Vascular: Carotid  bruit (left) present.  Cardiovascular:     Rate and Rhythm: Normal rate and regular rhythm.     Pulses: Normal pulses.     Heart sounds: Normal heart sounds.  Pulmonary:     Effort: Pulmonary effort is normal. No respiratory distress.     Breath sounds: Normal breath sounds.  Musculoskeletal:     Right lower leg: No edema.     Left lower leg: No edema.  Skin:    General: Skin is warm and dry.  Neurological:     Mental Status: She is alert and oriented to person, place, and time. Mental status is at  baseline.  Psychiatric:        Mood and Affect: Mood normal.        Behavior: Behavior normal.      No results found for any visits on 02/02/24.  Assessment & Plan    Follow-up exam, 3-6 months since previous exam  Acquired hypothyroidism  Hypertension associated with diabetes (HCC)  Controlled type 2 diabetes mellitus with complication, without long-term current use of insulin  (HCC)  Hyperlipidemia associated with type 2 diabetes mellitus (HCC)  Avitaminosis D     Routine follow-up Routine follow-up visit with no acute concerns. Active lifestyle with consistent sleep patterns. No new symptoms or health issues.  Carotid Artery Evaluation Abnormal carotid artery sounds detected. No exertional symptoms. - Order ultrasound of carotid arteries to evaluate for possible stenosis.  Knee surgery with complications Ongoing issues post-knee surgery, including knee knot and previous falls. Uses cane for stability.  Diabetes in remission Blood glucose levels consistently within prediabetic range. No diabetes-related symptoms. Maintains balanced diet and reduced sugar intake.  She has had no A1c's outside of prediabetic range in the last 10 years.  Her record does not go back far enough to verify if she was ever actually in diabetic range.  - Order blood work to reassess glucose levels.  Hypothyroidism Well-managed on current levothyroxine  dose. No new symptoms of thyroid  dysfunction. - Continue current dose of levothyroxine .  Hyperlipidemia On simvastatin  with no new cardiovascular symptoms. - Continue current dose of simvastatin .  Vitamin D  deficiency Resumed vitamin D  supplementation after previous deficiency. - Continue vitamin D  supplementation.  Immunization Status Has not received shingles vaccine or COVID booster this year. Previously received original shingles vaccine. - Discuss new shingles vaccine available at pharmacy, a two-dose series more effective than the old  one.   ***  No follow-ups on file.      I discussed the assessment and treatment plan with the patient  The patient was provided an opportunity to ask questions and all were answered. The patient agreed with the plan and demonstrated an understanding of the instructions.   The patient was advised to call back or seek an in-person evaluation if the symptoms worsen or if the condition fails to improve as anticipated.    Carlean Charter, DO  [facility] [phone] (phone) [phone] (fax)  [facility]

## 2024-02-05 LAB — MICROALBUMIN / CREATININE URINE RATIO

## 2024-02-06 ENCOUNTER — Encounter: Payer: Self-pay | Admitting: Family Medicine

## 2024-02-06 LAB — LIPID PANEL
Chol/HDL Ratio: 2.4 ratio (ref 0.0–4.4)
Cholesterol, Total: 194 mg/dL (ref 100–199)
HDL: 81 mg/dL (ref >39)
LDL Chol Calc (NIH): 100 mg/dL — ABNORMAL HIGH (ref 0–99)
Triglycerides: 73 mg/dL (ref 0–149)
VLDL Cholesterol Cal: 13 mg/dL (ref 5–40)

## 2024-02-06 LAB — COMPREHENSIVE METABOLIC PANEL WITH GFR
ALT: 11 IU/L (ref 0–32)
AST: 16 IU/L (ref 0–40)
Albumin: 4.6 g/dL (ref 3.7–4.7)
Alkaline Phosphatase: 61 IU/L (ref 44–121)
BUN/Creatinine Ratio: 17 (ref 12–28)
BUN: 13 mg/dL (ref 8–27)
Bilirubin Total: 0.3 mg/dL (ref 0.0–1.2)
CO2: 24 mmol/L (ref 20–29)
Calcium: 9.6 mg/dL (ref 8.7–10.3)
Chloride: 100 mmol/L (ref 96–106)
Creatinine, Ser: 0.75 mg/dL (ref 0.57–1.00)
Globulin, Total: 2.8 g/dL (ref 1.5–4.5)
Glucose: 115 mg/dL — ABNORMAL HIGH (ref 70–99)
Potassium: 4.6 mmol/L (ref 3.5–5.2)
Sodium: 140 mmol/L (ref 134–144)
Total Protein: 7.4 g/dL (ref 6.0–8.5)
eGFR: 79 mL/min/{1.73_m2} (ref >59)

## 2024-02-06 LAB — VITAMIN D 25 HYDROXY (VIT D DEFICIENCY, FRACTURES): Vit D, 25-Hydroxy: 28.4 ng/mL — ABNORMAL LOW (ref 30.0–100.0)

## 2024-02-06 LAB — TSH RFX ON ABNORMAL TO FREE T4: TSH: 2.91 u[IU]/mL (ref 0.450–4.500)

## 2024-02-06 LAB — HEMOGLOBIN A1C
Est. average glucose Bld gHb Est-mCnc: 120 mg/dL
Hgb A1c MFr Bld: 5.8 % — ABNORMAL HIGH (ref 4.8–5.6)

## 2024-02-06 LAB — MICROALBUMIN / CREATININE URINE RATIO

## 2024-02-12 ENCOUNTER — Ambulatory Visit
Admission: RE | Admit: 2024-02-12 | Discharge: 2024-02-12 | Disposition: A | Discharge status: Home / Self Care | Source: Ambulatory Visit | Attending: Family Medicine | Admitting: Family Medicine

## 2024-02-12 DIAGNOSIS — R0989 Other specified symptoms and signs involving the circulatory and respiratory systems: Secondary | ICD-10-CM | POA: Diagnosis not present

## 2024-02-12 DIAGNOSIS — I6523 Occlusion and stenosis of bilateral carotid arteries: Secondary | ICD-10-CM | POA: Diagnosis not present

## 2024-02-13 ENCOUNTER — Ambulatory Visit: Payer: Self-pay | Admitting: Family Medicine

## 2024-02-13 NOTE — Addendum Note (Signed)
 Addended by: Diona Franklin D on: 02/13/2024 10:03 AM   Modules accepted: Orders

## 2024-03-17 DIAGNOSIS — I6529 Occlusion and stenosis of unspecified carotid artery: Secondary | ICD-10-CM | POA: Insufficient documentation

## 2024-03-17 NOTE — Progress Notes (Deleted)
 MRN : [id number]  Sherry Jones is a 83 y.o. (Mar 15, 1941) female who presents with chief complaint of check carotid arteries.  History of Present Illness:   The patient is seen for evaluation of carotid stenosis. The carotid stenosis was identified after a carotid bruit was auscultated.  The patient denies amaurosis fugax. There is no recent history of TIA symptoms or focal motor deficits. There is no prior documented CVA.  There is no history of migraine headaches. There is no history of seizures.  The patient is taking enteric-coated aspirin 81 mg daily.  No recent shortening of the patient's walking distance or new symptoms consistent with claudication.  No history of rest pain symptoms. No new ulcers or wounds of the lower extremities have occurred.  There is no history of DVT, PE or superficial thrombophlebitis. No recent episodes of angina or shortness of breath documented.  Duplex ultrasound of the bilateral carotid arteries dated Feb 12, 2024 is reviewed by me and shows greater than 70% stenosis bilateral internal carotid arteries  No outpatient medications have been marked as taking for the 03/18/24 encounter (Appointment) with Prescilla Brod, Ninette Basque, MD.    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   Lifelong prophylaxis treatment   Squamous cell carcinoma of skin    Thyroid  disease     Past Surgical History:  Procedure Laterality Date   ABDOMINAL HYSTERECTOMY     CATARACT EXTRACTION     REPLACEMENT TOTAL KNEE     left knee   ROTATOR CUFF REPAIR Left     Social History Social History   Tobacco Use   Smoking status: Former    Current packs/day: 0.00    Types: Cigarettes    Start date: 01/02/1960    Quit date: 01/02/1996    Years since quitting: 28.2   Smokeless tobacco: Never  Vaping Use    Vaping status: Never Used  Substance Use Topics   Alcohol use: No   Drug use: No    Family History Family History  Problem Relation Age of Onset   Alzheimer's disease Mother    Diabetes Brother    Osteoarthritis Brother    Hypertension Son    Heart attack Son        stents placed   Breast cancer Neg Hx     Allergies  Allergen Reactions   Cefadroxil    Cefuroxime    Cefuroxime Axetil     Other reaction(s): Unknown Other reaction(s): NAUSEA   Ciprofloxacin Other (See Comments)   Codeine Swelling   Hydrocodone -Acetaminophen  Other (See Comments)    Other reaction(s): Other (See Comments) Other reaction(s): Unknown Other reaction(s): Other (See Comments)   Levofloxacin Other (See Comments)   Levothyroxine  Sodium Other (See Comments)   Meloxicam    Oxycodone      Other reaction(s): Syncope   Tramadol      Other reaction(s): Unknown   Cephalosporins Other (See Comments) and Rash   Gabapentin Nausea And Vomiting   Hydrocodone -Acetaminophen  Rash   Macrolides And Ketolides Other (See Comments)    Other reaction(s): Other (See Comments) Other Reaction: Intolerance Other reaction(s): Other (See Comments) Other Reaction: Intolerance Other reaction(s): Other (See Comments) Other Reaction: Intolerance Other reaction(s): Other (  See Comments) Other Reaction: Intolerance Other Reaction: Intolerance Other reaction(s): Other (See Comments) Other Reaction: Intolerance Other reaction(s): Other (See Comments) Other Reaction: Intolerance Other reaction(s): Other (See Comments) Other Reaction: Intolerance Other reaction(s): Other (See Comments) Other Reaction: Intolerance   Methocarbamol Rash   Nitrofuran Derivatives Other (See Comments)    Other Reaction: Intolerance   Nitrofurantoin     Other reaction(s): Other (See Comments) Other Reaction: Intolerance Other reaction(s): Other (See Comments) Other Reaction: Intolerance     REVIEW OF SYSTEMS (Negative unless  checked)  Constitutional: [] Weight loss  [] Fever  [] Chills Cardiac: [] Chest pain   [] Chest pressure   [] Palpitations   [] Shortness of breath when laying flat   [] Shortness of breath with exertion. Vascular:  [x] Pain in legs with walking   [] Pain in legs at rest  [] History of DVT   [] Phlebitis   [] Swelling in legs   [] Varicose veins   [] Non-healing ulcers Pulmonary:   [] Uses home oxygen   [] Productive cough   [] Hemoptysis   [] Wheeze  [] COPD   [] Asthma Neurologic:  [] Dizziness   [] Seizures   [] History of stroke   [] History of TIA  [] Aphasia   [] Vissual changes   [] Weakness or numbness in arm   [] Weakness or numbness in leg Musculoskeletal:   [] Joint swelling   [] Joint pain   [] Low back pain Hematologic:  [] Easy bruising  [] Easy bleeding   [] Hypercoagulable state   [] Anemic Gastrointestinal:  [] Diarrhea   [] Vomiting  [] Gastroesophageal reflux/heartburn   [] Difficulty swallowing. Genitourinary:  [] Chronic kidney disease   [] Difficult urination  [] Frequent urination   [] Blood in urine Skin:  [] Rashes   [] Ulcers  Psychological:  [] History of anxiety   []  History of major depression.  Physical Examination  There were no vitals filed for this visit. There is no height or weight on file to calculate BMI. Gen: WD/WN, NAD Head: [place]/AT, No temporalis wasting.  Ear/Nose/Throat: Hearing grossly intact, nares w/o erythema or drainage Eyes: PER, EOMI, sclera nonicteric.  Neck: Supple, no masses.  No bruit or JVD.  Pulmonary:  Good air movement, no audible wheezing, no use of accessory muscles.  Cardiac: RRR, normal S1, S2, no Murmurs. Vascular:  carotid bruit noted Vessel Right Left  Radial Palpable Palpable  Carotid  Palpable  Palpable  Subclav  Palpable Palpable  Gastrointestinal: soft, non-distended. No guarding/no peritoneal signs.  Musculoskeletal: M/S 5/5 throughout.  No visible deformity.  Neurologic: CN 2-12 intact. Pain and light touch intact in extremities.  Symmetrical.  Speech is fluent.  Motor exam as listed above. Psychiatric: Judgment intact, Mood & affect appropriate for pt's clinical situation. Dermatologic: No rashes or ulcers noted.  No changes consistent with cellulitis.   CBC Lab Results  Component Value Date   WBC 6.6 09/13/2023   HGB 13.2 09/13/2023   HCT 40.0 09/13/2023   MCV 90 09/13/2023   PLT 219 09/13/2023    BMET    Component Value Date/Time   NA 140 02/02/2024 0853   NA 136 10/20/2014 1951   K 4.6 02/02/2024 0853   K 3.7 10/20/2014 1951   CL 100 02/02/2024 0853   CL 101 10/20/2014 1951   CO2 24 02/02/2024 0853   CO2 26 10/20/2014 1951   GLUCOSE 115 (H) 02/02/2024 0853   GLUCOSE 118 (H) 04/10/2021 1945   GLUCOSE 123 (H) 10/20/2014 1951   BUN 13 02/02/2024 0853   BUN 20 (H) 10/20/2014 1951   CREATININE 0.75 02/02/2024 0853   CREATININE 0.83 10/20/2014 1951   CALCIUM  9.6 02/02/2024  1610   CALCIUM  8.3 (L) 10/20/2014 1951   GFRNONAA >60 04/10/2021 1945   GFRNONAA >60 10/20/2014 1951   GFRNONAA >60 08/26/2013 1436   GFRAA 88 04/27/2020 1026   GFRAA >60 10/20/2014 1951   GFRAA >60 08/26/2013 1436   CrCl cannot be calculated (Patient's most recent lab result is older than the maximum 21 days allowed.).  COAG No results found for: INR, PROTIME  Radiology No results found.   Assessment/Plan There are no diagnoses linked to this encounter.   Devon Fogo, MD  03/17/2024 4:07 PM

## 2024-03-18 ENCOUNTER — Encounter (INDEPENDENT_AMBULATORY_CARE_PROVIDER_SITE_OTHER): Admitting: Vascular Surgery

## 2024-03-18 DIAGNOSIS — I152 Hypertension secondary to endocrine disorders: Secondary | ICD-10-CM

## 2024-03-18 DIAGNOSIS — E1169 Type 2 diabetes mellitus with other specified complication: Secondary | ICD-10-CM

## 2024-03-18 DIAGNOSIS — I6523 Occlusion and stenosis of bilateral carotid arteries: Secondary | ICD-10-CM

## 2024-03-18 DIAGNOSIS — E119 Type 2 diabetes mellitus without complications: Secondary | ICD-10-CM

## 2024-04-25 ENCOUNTER — Emergency Department

## 2024-04-25 ENCOUNTER — Other Ambulatory Visit: Payer: Self-pay

## 2024-04-25 ENCOUNTER — Encounter: Payer: Self-pay | Admitting: Emergency Medicine

## 2024-04-25 ENCOUNTER — Emergency Department
Admission: EM | Admit: 2024-04-25 | Discharge: 2024-04-25 | Disposition: A | Discharge status: Home / Self Care | Attending: Emergency Medicine | Admitting: Emergency Medicine

## 2024-04-25 DIAGNOSIS — W19XXXA Unspecified fall, initial encounter: Secondary | ICD-10-CM

## 2024-04-25 DIAGNOSIS — W01198A Fall on same level from slipping, tripping and stumbling with subsequent striking against other object, initial encounter: Secondary | ICD-10-CM | POA: Diagnosis not present

## 2024-04-25 DIAGNOSIS — M25561 Pain in right knee: Secondary | ICD-10-CM | POA: Diagnosis not present

## 2024-04-25 DIAGNOSIS — M1711 Unilateral primary osteoarthritis, right knee: Secondary | ICD-10-CM | POA: Diagnosis not present

## 2024-04-25 DIAGNOSIS — M25511 Pain in right shoulder: Secondary | ICD-10-CM | POA: Diagnosis not present

## 2024-04-25 DIAGNOSIS — M791 Myalgia, unspecified site: Secondary | ICD-10-CM | POA: Insufficient documentation

## 2024-04-25 DIAGNOSIS — E119 Type 2 diabetes mellitus without complications: Secondary | ICD-10-CM | POA: Insufficient documentation

## 2024-04-25 DIAGNOSIS — I700 Atherosclerosis of aorta: Secondary | ICD-10-CM | POA: Diagnosis not present

## 2024-04-25 DIAGNOSIS — M7918 Myalgia, other site: Secondary | ICD-10-CM

## 2024-04-25 DIAGNOSIS — Z043 Encounter for examination and observation following other accident: Secondary | ICD-10-CM | POA: Diagnosis not present

## 2024-04-25 NOTE — Discharge Instructions (Addendum)
 Please use Tylenol  1000 mg every 6 hours as needed for pain.  Please follow-up with your doctor if you continue to have pain.  Please use your walker until you are pain-free.

## 2024-04-25 NOTE — ED Provider Notes (Signed)
 [facility] Provider Note    Event Date/Time   First MD Initiated Contact with Patient 04/25/24 1843     (approximate)  History   Chief Complaint: Fall  HPI  Sherry Jones is a 83 y.o. female with a past medical history of diabetes, hyperlipidemia, presents to the emergency department after a fall yesterday.  According to the patient she was using her cane yesterday and her daughter had just mopped the floors and they were still wet.  States the cane slipped out and she fell onto the right side.  Patient denies hitting her head denies LOC.  Patient is complaining of right shoulder pain.  Mild right knee pain.  Patient has been ambulatory with her walker without issue.  States she has been taking Tylenol  but continues to have pain and has trouble sleeping last night due to the pain.  Physical Exam   Triage Vital Signs: ED Triage Vitals  Encounter Vitals Group     BP 04/25/24 1600 (!) 145/62     Girls Systolic BP Percentile --      Girls Diastolic BP Percentile --      Boys Systolic BP Percentile --      Boys Diastolic BP Percentile --      Pulse Rate 04/25/24 1600 93     Resp 04/25/24 1600 19     Temp 04/25/24 1600 98.2 F (36.8 C)     Temp Source 04/25/24 1600 Oral     SpO2 04/25/24 1600 96 %     Weight 04/25/24 1601 130 lb (59 kg)     Height 04/25/24 1601 4' 8 (1.422 m)     Head Circumference --      Peak Flow --      Pain Score 04/25/24 1612 8     Pain Loc --      Pain Education --      Exclude from Growth Chart --     Most recent vital signs: Vitals:   04/25/24 1600  BP: (!) 145/62  Pulse: 93  Resp: 19  Temp: 98.2 F (36.8 C)  SpO2: 96%    General: Awake, no distress.  CV:  Good peripheral perfusion.  Regular rate and rhythm  Resp:  Normal effort.  Equal breath sounds bilaterally.  Abd:  No distention.  Soft, nontender.   Other:  Mild tenderness to palpation of the right knee but good range of motion with minimal discomfort.  Mild  tenderness of the right shoulder but again good range of motion with minimal discomfort.  Neurovascular intact in both extremities.  No lateral chest wall tenderness.   ED Results / Procedures / Treatments   RADIOLOGY  I have reviewed and interpreted the chest x-ray images.  No obvious consolidation or significant rib fracture seen on my evaluation. Radiology is read the x-ray is negative.   MEDICATIONS ORDERED IN ED: Medications - No data to display   IMPRESSION / MDM / ASSESSMENT AND PLAN / ED COURSE  I reviewed the triage vital signs and the nursing notes.  Patient's presentation is most consistent with acute presentation with potential threat to life or bodily function.  Patient presents the emergency department for a fall yesterday.  Overall the patient appears well.  X-rays are reassuring with a normal right shoulder x-ray, negative right knee x-ray and reassuring chest x-ray.  I discussed with the patient to continue using Tylenol  1000 mg every 6 hours as needed for pain.  Will prescribe tramadol   to be used at night and if needed for significant pain.  I discussed with the patient that this medication may make her somewhat dizzy or tired and she should only take it at night when she is able to go to sleep.  Patient understands and is agreeable to this plan.  FINAL CLINICAL IMPRESSION(S) / ED DIAGNOSES   Fall Musculoskeletal pain   Note:  This document was prepared using Dragon voice recognition software and may include unintentional dictation errors.   Dorothyann Drivers, MD 04/25/24 [phone]

## 2024-04-25 NOTE — ED Provider Triage Note (Signed)
 Emergency Medicine Provider Triage Evaluation Note  Sherry Jones , a 83 y.o. female  was evaluated in triage.  Pt complains of right shoulder pain.  According to the patient she has a mechanical fall, landing on her right shoulder.  States having pain in right chest and right knee.  Is not taking blood thinners patient denies loss of consciousness.  Review of Systems  Positive:  Negative:   Physical Exam  BP (!) 145/62 (BP Location: Left Arm)   Pulse 93   Temp 98.2 F (36.8 C) (Oral)   Resp 19   Ht 4' 8 (1.422 m)   Wt 59 kg   SpO2 96%   BMI 29.15 kg/m patient is hypertensive at triage Gen:   Awake, no distress   Resp:  Normal effort no wheezing MSK:   Moves extremities without difficulty  Other:  Right shoulder: Tender to palpation at the level of acromioclavicular joint, unable to extend, full ROM limited by pain.  Right chest tender to palpation at the level of the fifth rib with medial clavicular line.  Right knee tender to palpation Medical Decision Making  Medically screening exam initiated at 4:14 PM.  Appropriate orders placed.  Sherry Jones was informed that the remainder of the evaluation will be completed by another provider, this initial triage assessment does not replace that evaluation, and the importance of remaining in the ED until their evaluation is complete.  Patient with history of mechanical fall, right shoulder pain, tender to palpation and full ROM limited by pain.  Tender to palpation in anterior chest at the level of fifth rib.  Shoulder x-ray, chest x-ray.  Right knee x-ray   Sherry Kast, PA-C 04/25/24 1617

## 2024-04-25 NOTE — ED Triage Notes (Signed)
 Patient to ED via POV for a mechanical fall yesterday. PT reports she fell on her right side. States she hit head- denies LOC or blood thinners. C/o right shoulder pain, right knee.

## 2024-05-03 ENCOUNTER — Encounter: Payer: Self-pay | Admitting: Family Medicine

## 2024-05-03 ENCOUNTER — Ambulatory Visit (INDEPENDENT_AMBULATORY_CARE_PROVIDER_SITE_OTHER): Admitting: Family Medicine

## 2024-05-03 VITALS — BP 151/47 | HR 57 | Temp 97.7°F | Ht <= 58 in | Wt 111.9 lb

## 2024-05-03 DIAGNOSIS — E1159 Type 2 diabetes mellitus with other circulatory complications: Secondary | ICD-10-CM

## 2024-05-03 DIAGNOSIS — E039 Hypothyroidism, unspecified: Secondary | ICD-10-CM

## 2024-05-03 DIAGNOSIS — I152 Hypertension secondary to endocrine disorders: Secondary | ICD-10-CM

## 2024-05-03 DIAGNOSIS — M25511 Pain in right shoulder: Secondary | ICD-10-CM

## 2024-05-03 DIAGNOSIS — E119 Type 2 diabetes mellitus without complications: Secondary | ICD-10-CM

## 2024-05-03 MED ORDER — LEVOTHYROXINE SODIUM 75 MCG PO TABS: 75.0000 ug | ORAL_TABLET | Freq: Every day | ORAL | 3 refills | Status: AC

## 2024-05-03 NOTE — Progress Notes (Signed)
 Established patient visit   Patient: Sherry Jones   DOB: [birth year]   83 y.o. Female  MRN: [id number] Visit Date: 05/03/2024  Today's healthcare provider: LAURAINE LOISE BUOY, DO   Chief Complaint  Patient presents with   Medical Management of Chronic Issues   Hypertension    Hypertension Patient is here for follow-up of elevated blood pressure.  Patient reports that she is constantly on the go and moving, is not one to sit still.  Patient is adherent to a low-salt diet. Tries to eat and drink things that are sugar free and low in salt.  Blood pressure is not really checked at home. Patient denies any symptoms.    Diabetes    Sherry Jones is a 83 y.o. female who presents for follow up of diabetes. Current symptoms include: none. Patient denies any symptoms.  Monitors blood sugar at home and reports that is it going fine.  States that she is constantly on the go is not one to sit.   Last dilated eye exam 2 or 3 years ago.     Subjective    HPI Sherry Jones is an 83 year old female who presents with shoulder pain following a fall.  She experienced shoulder pain after a fall 04/04/2024 and visited the emergency room the next day where no fractures were found. The pain persists, especially when raising her arm, but she maintains mobility and reports gradual improvement. She continues shoulder exercises and reports taking tramadol  for pain and Tylenol  occasionally as needed.  Her blood pressure was noted to be slightly elevated at the emergency room, but she has not been on any medication for hypertension and is not currently monitoring her blood pressure at home. No dizziness or lightheadedness is reported.  Her current medications include simvastatin , levothyroxine , vitamin D , and vitamin C. She takes a high dose of vitamin D  once a month, although she was previously taking it weekly. She reports feeling fine and does not experience fatigue.  She maintains an active lifestyle, staying  busy with household chores and activities such as animal rescue and babysitting her great-granddaughter. No chest pain, shortness of breath, headaches, or gastrointestinal symptoms.      Medications: Outpatient Medications Prior to Visit  Medication Sig   simvastatin  (ZOCOR ) 10 MG tablet Take 1 tablet (10 mg total) by mouth at bedtime.   [DISCONTINUED] levothyroxine  (SYNTHROID ) 75 MCG tablet TAKE 1 TABLET BY MOUTH EVERY DAY   No facility-administered medications prior to visit.    Review of Systems  Constitutional:  Negative for appetite change, chills, fatigue and fever.  Respiratory:  Negative for chest tightness and shortness of breath.   Cardiovascular:  Negative for chest pain and palpitations.  Gastrointestinal:  Negative for abdominal pain, nausea and vomiting.  Musculoskeletal:  Positive for arthralgias (right shoulder pain; s/p recent fall; improving).  Neurological:  Negative for dizziness and weakness.        Objective    BP (!) 151/47 (BP Location: Right Arm, Cuff Size: Normal)   Pulse (!) 57   Temp 97.7 F (36.5 C) (Oral)   Ht 4' 5 (1.346 m)   Wt 111 lb 14.4 oz (50.8 kg)   SpO2 97%   BMI 28.01 kg/m     Physical Exam Constitutional:      Appearance: Normal appearance.  HENT:     Head: Normocephalic and atraumatic.  Eyes:     General: No scleral icterus.    Extraocular Movements:  Extraocular movements intact.     Conjunctiva/sclera: Conjunctivae normal.  Cardiovascular:     Rate and Rhythm: Normal rate and regular rhythm.     Pulses: Normal pulses.     Heart sounds: Normal heart sounds.  Pulmonary:     Effort: Pulmonary effort is normal. No respiratory distress.     Breath sounds: Normal breath sounds.  Musculoskeletal:     Right lower leg: No edema.     Left lower leg: No edema.  Skin:    General: Skin is warm and dry.  Neurological:     Mental Status: She is alert and oriented to person, place, and time. Mental status is at baseline.   Psychiatric:        Mood and Affect: Mood normal.        Behavior: Behavior normal.      No results found for any visits on 05/03/24.  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  Dispense: 90 tablet; Refill: 3  Acute pain of right shoulder     Type 2 diabetes in remission Well-controlled.  Not on medications. No acute concerns.  Continue to monitor. - uACR ordered today.   Acute right shoulder pain after fall, improving Persistent pain with arm elevation, no fractures, gradual improvement. - Continue shoulder range of motion exercises. - Use Tylenol  and tramadol  as needed for pain.  Essential hypertension Slightly elevated blood pressure, no antihypertensive medication due to fall risk concerns.  Vitamin D  deficiency Low vitamin D  levels, current monthly supplementation may be insufficient. - Increase vitamin D  supplementation to once every two or three weeks.    Return in about 5 months (around 10/07/2024) for CPE.      I discussed the assessment and treatment plan with the patient  The patient was provided an opportunity to ask questions and all were answered. The patient agreed with the plan and demonstrated an understanding of the instructions.   The patient was advised to call back or seek an in-person evaluation if the symptoms worsen or if the condition fails to improve as anticipated.    LAURAINE LOISE BUOY, DO  [facility] [phone] (phone) [phone] (fax)  [facility]

## 2024-05-03 NOTE — Patient Instructions (Signed)
 You are due for your eye exam this month; please schedule it as soon as possible!

## 2024-05-04 LAB — MICROALBUMIN / CREATININE URINE RATIO
Creatinine, Urine: 42.1 mg/dL
Microalb/Creat Ratio: <7 mg/g{creat} (ref 0–29)
Microalbumin, Urine: <3 ug/mL

## 2024-05-07 ENCOUNTER — Ambulatory Visit (INDEPENDENT_AMBULATORY_CARE_PROVIDER_SITE_OTHER): Payer: Medicare Other

## 2024-05-07 VITALS — BP 130/72 | Ht <= 58 in | Wt 111.5 lb

## 2024-05-07 DIAGNOSIS — Z0000 Encounter for general adult medical examination without abnormal findings: Secondary | ICD-10-CM

## 2024-05-07 NOTE — Progress Notes (Signed)
 Subjective:   KENDALYNN WIDEMAN is a 83 y.o. who presents for a Medicare Wellness preventive visit.  As a reminder, Annual Wellness Visits don't include a physical exam, and some assessments may be limited, especially if this visit is performed virtually. We may recommend an in-person follow-up visit with your provider if needed.  Visit Complete: In person   Persons Participating in Visit: Patient.  AWV Questionnaire: No: Patient Medicare AWV questionnaire was not completed prior to this visit.  Cardiac Risk Factors include: advanced age (>18men, >38 women);diabetes mellitus;hypertension;dyslipidemia     Objective:    Today's Vitals   05/07/24 1145 05/07/24 1147  BP: 130/72   Weight: 111 lb 8 oz (50.6 kg)   Height: 4' 5 (1.346 m)   PainSc:  3    Body mass index is 27.91 kg/m.     05/07/2024   11:55 AM 04/25/2024    4:14 PM 05/02/2023   11:50 AM 04/10/2021    6:08 PM 10/13/2020   12:29 PM 10/12/2020    3:45 PM 07/30/2020    8:04 PM  Advanced Directives  Does Patient Have a Medical Advance Directive? No No No No No No No  Would patient like information on creating a medical advance directive? No - Patient declined     No - Patient declined     Current Medications (verified) Outpatient Encounter Medications as of 05/07/2024  Medication Sig   Ascorbic Acid (VITAMIN C) 1000 MG tablet Take 1,000 mg by mouth daily.   cholecalciferol (VITAMIN D3) 25 MCG (1000 UNIT) tablet Take 1,000 Units by mouth.   levothyroxine  (SYNTHROID ) 75 MCG tablet Take 1 tablet (75 mcg total) by mouth daily.   Multiple Vitamin (MULTIVITAMIN) tablet Take 1 tablet by mouth daily.   simvastatin  (ZOCOR ) 10 MG tablet Take 1 tablet (10 mg total) by mouth at bedtime.   No facility-administered encounter medications on file as of 05/07/2024.    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   Lifelong prophylaxis treatment   Squamous cell carcinoma of skin    Thyroid  disease    Past Surgical History:  Procedure Laterality Date   ABDOMINAL HYSTERECTOMY     CATARACT EXTRACTION     REPLACEMENT TOTAL KNEE     left knee   ROTATOR CUFF REPAIR Left    Family History  Problem Relation Age of Onset   Alzheimer's disease Mother    Diabetes Brother    Osteoarthritis Brother    Hypertension Son    Heart attack Son        stents placed   Breast cancer Neg Hx    Social History   Socioeconomic History   Marital status: Widowed    Spouse name: Not on file   Number of children: 3   Years of education: Not on file   Highest education level: Some college, no degree  Occupational History   Occupation: retired  Tobacco Use   Smoking status: Former    Current packs/day: 0.00    Types: Cigarettes    Start date: 01/02/1960    Quit date: 01/02/1996    Years since quitting: 28.3   Smokeless tobacco: Never  Vaping Use   Vaping status: Never Used  Substance and Sexual Activity   Alcohol use: No   Drug use: No   Sexual activity: Never  Other Topics Concern   Not on file  Social History Narrative   Oldest son was killed in 2004 from getting hit by a train.    Social Drivers of Corporate investment banker Strain: Low Risk  (05/07/2024)   Overall Financial Resource Strain (CARDIA)    Difficulty of Paying Living Expenses: Not hard at all  Food Insecurity: No Food Insecurity (05/07/2024)   Hunger Vital Sign    Worried About Running Out of Food in the Last Year: Never true    Ran Out of Food in the Last Year: Never true   Transportation Needs: No Transportation Needs (05/07/2024)   PRAPARE - Administrator, Civil Service (Medical): No    Lack of Transportation (Non-Medical): No  Physical Activity: Insufficiently Active (05/07/2024)   Exercise Vital Sign    Days of Exercise per Week: 5 days    Minutes of Exercise per Session: 20 min  Stress: No Stress Concern Present (05/07/2024)   [employer] of Occupational Health - Occupational Stress Questionnaire    Feeling of Stress: Not at all  Social Connections: Moderately Isolated (05/07/2024)   Social Connection and Isolation Panel    Frequency of Communication with Friends and Family: More than three times a week    Frequency of Social Gatherings with Friends and Family: Three times a week    Attends Religious Services: More than 4 times per year    Active Member of Clubs or Organizations: No    Attends Banker Meetings: Never    Marital Status: Widowed    Tobacco Counseling Counseling given: Not Answered    Clinical Intake:  Pre-visit preparation completed: Yes  Pain : 0-10 Pain Score: 3  Pain Type: Acute pain Pain Location: Shoulder Pain Orientation: Right Pain Descriptors / Indicators: Aching, Discomfort, Constant Pain Onset: 1 to 4 weeks ago Pain Frequency: Constant     BMI - recorded: 27.91 Nutritional Status: BMI 25 -29 Overweight Nutritional Risks: None Diabetes: Yes CBG done?: No Did pt. bring in CBG monitor from home?: No  Lab Results  Component Value Date   HGBA1C 5.8 (H) 02/02/2024   HGBA1C 6.0 (H) 09/13/2023   HGBA1C 5.8 (H) 05/10/2022     How often do you need to have someone help you when you read instructions, pamphlets, or other written materials from your doctor or pharmacy?: 1 - Never  Interpreter Needed?: No  Information entered by :: JHONNIE DAS, LPN   Activities of Daily Living     05/07/2024   11:56 AM  In your present state of health, do you have any difficulty performing the  following activities:  Hearing? 1  Vision? 0  Difficulty concentrating or making decisions? 0  Walking or climbing stairs? 1  Dressing or bathing? 0  Doing errands, shopping? 0  Preparing Food and eating ? N  Using the Toilet? N  In the past six months, have you accidently leaked urine? N  Do you have problems with loss of bowel control? N  Managing your Medications? N  Managing your Finances? N  Housekeeping or managing your Housekeeping? Y    Patient Care Team: Donzella Lauraine SAILOR, DO as PCP - [facility] (Family Medicine) [facility], [facility] (Optometry)  I have updated your Care Teams any recent Medical Services you may have received  from other providers in the past year.     Assessment:   This is a routine wellness examination for Ambika.  Hearing/Vision screen Hearing Screening - Comments:: WEARS AIDS Vision Screening - Comments:: READERS FOR SMALL PRINT-    Goals Addressed             This Visit's Progress    DIET - EAT MORE FRUITS AND VEGETABLES         Depression Screen     05/07/2024   11:53 AM 05/03/2024    8:31 AM 05/02/2023   11:44 AM 07/26/2022    3:44 PM 05/10/2022    2:20 PM 04/29/2021   11:25 AM 01/26/2021    4:24 PM  PHQ 2/9 Scores  PHQ - 2 Score 0 0 0 0 0 0 0  PHQ- 9 Score 0 0  0 0 0 0    Fall Risk     05/07/2024   11:56 AM 05/03/2024    8:30 AM 05/02/2023   11:34 AM 07/26/2022    3:44 PM 05/10/2022    2:20 PM  Fall Risk   Falls in the past year? 1 1 0 1 1  Number falls in past yr: 1 1 0 0 0  Injury with Fall? 1 1 0 1 1  Comment  Fell and hit shoulder, went to ER everything was fine.  Just will be sore.     Risk for fall due to : Impaired mobility  No Fall Risks  No Fall Risks  Follow up Falls evaluation completed;Falls prevention discussed  Education provided;Falls prevention discussed      MEDICARE RISK AT HOME:  Medicare Risk at Home Any stairs in or around the home?: Yes If so, are there any without handrails?: No Home free of loose throw  rugs in walkways, pet beds, electrical cords, etc?: Yes Adequate lighting in your home to reduce risk of falls?: Yes Life alert?: No Use of a cane, walker or w/c?: Yes (CANE ALL THE TIME) Grab bars in the bathroom?: Yes Shower chair or bench in shower?: Yes Elevated toilet seat or a handicapped toilet?: No  TIMED UP AND GO:  Was the test performed?  Yes  Length of time to ambulate 10 feet: 6 sec Gait slow and steady with assistive device  Cognitive Function: 6CIT completed        05/07/2024   11:58 AM 05/02/2023   11:52 AM 01/28/2019    2:22 PM 01/20/2017    9:17 AM  6CIT Screen  What Year? 0 points 0 points 0 points 0 points  What month? 3 points 0 points 0 points 0 points  What time? 0 points 0 points 0 points 0 points  Count back from 20 0 points 0 points 0 points 0 points  Months in reverse 0 points 0 points 0 points 0 points  Repeat phrase 0 points 0 points 0 points 8 points  Total Score 3 points 0 points 0 points 8 points    Immunizations Immunization History  Administered Date(s) Administered   Influenza, High Dose Seasonal PF 09/03/2014, 08/09/2016   Influenza,inj,Quad PF,6+ Mos 06/24/2013   Pneumococcal Conjugate-13 05/14/2014   Pneumococcal Polysaccharide-23 05/27/2011   Td 10/03/1997   Tdap 05/27/2011, 11/21/2015, 12/06/2019   Zoster, Live 02/24/2012    Screening Tests Health Maintenance  Topic Date Due   Zoster Vaccines- Shingrix (1 of 2) 10/21/1960   COVID-19 Vaccine (1) 07/03/2024 (Originally 10/21/1945)   INFLUENZA VACCINE  12/31/2024 (Originally 05/03/2024)   OPHTHALMOLOGY EXAM  05/29/2024   MAMMOGRAM  07/18/2024   HEMOGLOBIN A1C  08/04/2024   Diabetic kidney evaluation - eGFR measurement  02/01/2025   Diabetic kidney evaluation - Urine ACR  05/03/2025   Medicare Annual Wellness (AWV)  05/07/2025   DEXA SCAN  07/18/2025   DTaP/Tdap/Td (5 - Td or Tdap) 12/05/2029   Pneumococcal Vaccine: 50+ Years  Completed   Hepatitis B Vaccines  Aged Out   HPV  VACCINES  Aged Out   Meningococcal B Vaccine  Aged Out   FOOT EXAM  Discontinued    Health Maintenance  Health Maintenance Due  Topic Date Due   Zoster Vaccines- Shingrix (1 of 2) 10/21/1960   Health Maintenance Items Addressed: UP TO DATE ON TDAP, NEEDS SHINGRIX; MAMMOGRAM & BDS UP TO DATE; AGED OUT OF COLONOSCOPY  Additional Screening:  Vision Screening: Recommended annual ophthalmology exams for early detection of glaucoma and other disorders of the eye. Would you like a referral to an eye doctor? No    Dental Screening: Recommended annual dental exams for proper oral hygiene  Community Resource Referral / Chronic Care Management: CRR required this visit?  No   CCM required this visit?  No   Plan:    I have personally reviewed and noted the following in the patient's chart:   Medical and social history Use of alcohol, tobacco or illicit drugs  Current medications and supplements including opioid prescriptions. Patient is not currently taking opioid prescriptions. Functional ability and status Nutritional status Physical activity Advanced directives List of other physicians Hospitalizations, surgeries, and ER visits in previous 12 months Vitals Screenings to include cognitive, depression, and falls Referrals and appointments  In addition, I have reviewed and discussed with patient certain preventive protocols, quality metrics, and best practice recommendations. A written personalized care plan for preventive services as well as general preventive health recommendations were provided to patient.   Jhonnie GORMAN Das, LPN   10/06/7972   After Visit Summary: (In Person-Declined) Patient declined AVS at this time.  Notes: Nothing significant to report at this time.

## 2024-05-07 NOTE — Patient Instructions (Addendum)
 [name] , Thank you for taking time out of your busy schedule to complete your Annual Wellness Visit with me. I enjoyed our conversation and look forward to speaking with you again next year. I, as well as your care team,  appreciate your ongoing commitment to your health goals. Please review the following plan we discussed and let me know if I can assist you in the future.   Follow up Visits: 05/13/25 @ 10:10 AM IN PERSON We will see or speak with you next year for your Next Medicare AWV with our clinical staff Have you seen your provider in the last 6 months (3 months if uncontrolled diabetes)? Yes  Clinician Recommendations:  Aim for 30 minutes of exercise or brisk walking, 6-8 glasses of water, and 5 servings of fruits and vegetables each day. TAKE CARE!      This is a list of the screenings recommended for you:  Health Maintenance  Topic Date Due   Zoster (Shingles) Vaccine (1 of 2) 10/21/1960   COVID-19 Vaccine (1) 07/03/2024*   Flu Shot  12/31/2024*   Eye exam for diabetics  05/29/2024   Mammogram  07/18/2024   Hemoglobin A1C  08/04/2024   Yearly kidney function blood test for diabetes  02/01/2025   Yearly kidney health urinalysis for diabetes  05/03/2025   Medicare Annual Wellness Visit  05/07/2025   DEXA scan (bone density measurement)  07/18/2025   DTaP/Tdap/Td vaccine (5 - Td or Tdap) 12/05/2029   Pneumococcal Vaccine for age over 3  Completed   Hepatitis B Vaccine  Aged Out   HPV Vaccine  Aged Out   Meningitis B Vaccine  Aged Out   Complete foot exam   Discontinued  *Topic was postponed. The date shown is not the original due date.    Advanced directives: (ACP Link)Information on Advanced Care Planning can be found at [place]  Secretary of [facility] Advance Health Care Directives Advance Health Care Directives. [email or web address]  Advance Care Planning is important because it:  [x]  Makes sure you receive the medical care that is consistent with your values, goals, and  preferences  [x]  It provides guidance to your family and loved ones and reduces their decisional burden about whether or not they are making the right decisions based on your wishes.  Follow the link provided in your after visit summary or read over the paperwork we have mailed to you to help you started getting your Advance Directives in place. If you need assistance in completing these, please reach out to us  so that we can help you!

## 2024-05-08 ENCOUNTER — Ambulatory Visit: Payer: Self-pay | Admitting: Family Medicine

## 2024-05-30 ENCOUNTER — Ambulatory Visit: Payer: Self-pay

## 2024-05-30 NOTE — Telephone Encounter (Signed)
 FYI Only or Action Required?: FYI only for provider.  Patient was last seen in primary care on 05/03/2024 by Donzella Lauraine SAILOR, DO.  Called Nurse Triage reporting Shoulder Injury.  Symptoms began about a month ago.  Interventions attempted: Nothing.  Symptoms are: unchanged.  Triage Disposition: See Physician Within 24 Hours  Patient/caregiver understands and will follow disposition?: Yes  Copied from CRM #[id number]. Topic: Clinical - Red Word Triage >> May 30, 2024 12:18 PM Emylou G wrote: [facility] that prompted transfer to Nurse Triage: fell on right shoulder and thinks something is out of place.. in pain Reason for Disposition  [1] MODERATE pain (e.g., interferes with normal activities) AND [2] high-risk adult (e.g., age > 60 years, osteoporosis, chronic steroid use)  Answer Assessment - Initial Assessment Questions R shoulder Injury from a fall in July, Xrays came back negative, patient states she is still experiencing pain and feels like something is out of place. Denies numbness and tingling. Denies chest pain, SOB.    1. MECHANISM: How did the injury happen?     Had a fall  2. ONSET: When did the injury happen? (e.g., minutes, hours ago)      July, pain has gotten worse over last couple days  3. APPEARANCE of INJURY: What does the injury look like?      Feels like there is something out of place, small bump  4. SEVERITY: Can you move the shoulder normally?      Trouble raising arm up.  5. SIZE: For cuts, bruises, or swelling, ask: How large is it? (e.g., inches or centimeters;  entire joint)      No swelling, just notices a bump.  6. PAIN: Is there pain? If Yes, ask: How bad is the pain?  (Scale 0-10; or none, mild, moderate, severe)     Moderate  7. OTHER SYMPTOMS: Do you have any other symptoms? (e.g., loss of sensation)     No  Protocols used: Shoulder Injury-A-AH

## 2024-05-31 ENCOUNTER — Ambulatory Visit: Admitting: Family Medicine

## 2024-05-31 ENCOUNTER — Encounter: Payer: Self-pay | Admitting: Family Medicine

## 2024-05-31 VITALS — BP 117/67 | HR 99 | Resp 16 | Wt 107.2 lb

## 2024-05-31 DIAGNOSIS — M25511 Pain in right shoulder: Secondary | ICD-10-CM | POA: Diagnosis not present

## 2024-05-31 DIAGNOSIS — H9193 Unspecified hearing loss, bilateral: Secondary | ICD-10-CM

## 2024-05-31 DIAGNOSIS — S4991XD Unspecified injury of right shoulder and upper arm, subsequent encounter: Secondary | ICD-10-CM

## 2024-05-31 MED ORDER — METHYLPREDNISOLONE 4 MG PO TBPK: ORAL_TABLET | ORAL | 0 refills | Status: AC

## 2024-05-31 NOTE — Progress Notes (Signed)
 Established patient visit   Patient: Sherry Jones   DOB: 1941/04/06   83 y.o. Female  MRN: [id number] Visit Date: 05/31/2024  Today's healthcare provider: LAURAINE LOISE BUOY, DO   Chief Complaint  Patient presents with   Shoulder Injury    From a fall. Patient was seen at the ED   Fall    X a week from the fall. Patient was seen at the ER  and had xray done and were normal. Patient reports shoulder is not better.   Subjective    Shoulder Injury  Pertinent negatives include no chest pain or numbness.  Fall Pertinent negatives include no headaches or numbness.   Sherry Jones is an 83 year old female who presents with right shoulder pain and limited mobility.  She has been experiencing persistent, severe pain in her right shoulder for the past month, which radiates down to her elbow.  The pain started after she experienced a fall in July, for which x-rays were negative for fracture.  The pain prevents her from lifting her arm (especially at the shoulder) or using it for daily activities such as pouring coffee. She initially used a scarf to immobilize her arm due to the severity of the pain. No numbness or tingling in the arm has been noted.  She has been using Tylenol  for pain relief without effectiveness and has tried a topical ointment provided by her son, which temporarily numbs the area but has a strong smell. She has previously used tramadol , which was helpful, but she is not currently taking it. She is taking meloxicam for pain management. No dizziness has been reported with tramadol  use in the past, and she does not recall any medication allergies despite having several listed in her medical record.  Her daily activities have been significantly impacted, as she is unable to play with her great-grandchildren or perform household tasks like washing dishes without difficulty. Being left-handed has somewhat mitigated the impact on her ability to write and perform other  tasks.       Medications: Outpatient Medications Prior to Visit  Medication Sig   Ascorbic Acid (VITAMIN C) 1000 MG tablet Take 1,000 mg by mouth daily.   cholecalciferol (VITAMIN D3) 25 MCG (1000 UNIT) tablet Take 1,000 Units by mouth.   levothyroxine  (SYNTHROID ) 75 MCG tablet Take 1 tablet (75 mcg total) by mouth daily.   meloxicam (MOBIC) 15 MG tablet Take 15 mg by mouth daily.   Multiple Vitamin (MULTIVITAMIN) tablet Take 1 tablet by mouth daily.   simvastatin  (ZOCOR ) 10 MG tablet Take 1 tablet (10 mg total) by mouth at bedtime.   No facility-administered medications prior to visit.    Review of Systems  Respiratory: Negative.  Negative for cough, shortness of breath and wheezing.   Cardiovascular:  Negative for chest pain, palpitations and leg swelling.  Neurological:  Negative for weakness, numbness and headaches.        Objective    BP 117/67 (BP Location: Left Arm, Patient Position: Sitting, Cuff Size: Normal)   Pulse 99   Resp 16   Wt 107 lb 3.2 oz (48.6 kg)   SpO2 97%   BMI 26.83 kg/m     Physical Exam Vitals and nursing note reviewed.  Constitutional:      General: She is not in acute distress.    Appearance: Normal appearance.  HENT:     Head: Normocephalic and atraumatic.  Eyes:     General: No scleral icterus.  Conjunctiva/sclera: Conjunctivae normal.  Cardiovascular:     Rate and Rhythm: Normal rate.  Pulmonary:     Effort: Pulmonary effort is normal.  Musculoskeletal:     Right shoulder: Tenderness and bony tenderness (lateral superior aspect and anterolateral clavicle) present. No swelling or deformity. Decreased range of motion. Normal pulse.     Left shoulder: Normal.  Neurological:     Mental Status: She is alert and oriented to person, place, and time. Mental status is at baseline.  Psychiatric:        Mood and Affect: Mood normal.        Behavior: Behavior normal.      No results found for any visits on 05/31/24.  Assessment &  Plan    Right shoulder injury, subsequent encounter -     Ambulatory referral to Orthopedic Surgery -     methylPREDNISolone ; Take 6 pills on day 1, 5 pills on day 2, 4 pills on day 3, 3 pills on day 4, 2 pills on day 5, 1 pill on day 6  Dispense: 1 each; Refill: 0  Bilateral hearing loss, unspecified hearing loss type -     Ambulatory referral to Audiology     Right shoulder and upper arm injury Chronic pain with limited range of motion, radiating to the elbow, exacerbated by movement. Tylenol  provided minimal relief; tramadol  effective without dizziness. Currently on meloxicam. No numbness or tingling. No recent visit to [facility]. - Prescribed prednisone  to reduce inflammation. - Referred to [facility] for further evaluation and possible MRI. - Encouraged scheduling an appointment with [facility] or visiting their walk-in clinic. - Sent urgent referral to [facility].  Hearing loss, bilateral Sent referral to audiology for hearing evaluation.    Return if symptoms worsen or fail to improve.      I discussed the assessment and treatment plan with the patient  The patient was provided an opportunity to ask questions and all were answered. The patient agreed with the plan and demonstrated an understanding of the instructions.   The patient was advised to call back or seek an in-person evaluation if the symptoms worsen or if the condition fails to improve as anticipated.    LAURAINE LOISE BUOY, DO  [facility] [phone] (phone) [phone] (fax)  [facility]

## 2024-05-31 NOTE — Patient Instructions (Signed)
 Call and schedule an appointment with [facility].

## 2024-08-12 ENCOUNTER — Emergency Department

## 2024-08-12 ENCOUNTER — Other Ambulatory Visit: Payer: Self-pay

## 2024-08-12 ENCOUNTER — Emergency Department
Admission: EM | Admit: 2024-08-12 | Discharge: 2024-08-12 | Disposition: A | Discharge status: Home / Self Care | Attending: Emergency Medicine | Admitting: Emergency Medicine

## 2024-08-12 DIAGNOSIS — S46911A Strain of unspecified muscle, fascia and tendon at shoulder and upper arm level, right arm, initial encounter: Secondary | ICD-10-CM | POA: Diagnosis not present

## 2024-08-12 DIAGNOSIS — W19XXXA Unspecified fall, initial encounter: Secondary | ICD-10-CM

## 2024-08-12 DIAGNOSIS — S4991XA Unspecified injury of right shoulder and upper arm, initial encounter: Secondary | ICD-10-CM | POA: Diagnosis present

## 2024-08-12 DIAGNOSIS — S0990XA Unspecified injury of head, initial encounter: Secondary | ICD-10-CM | POA: Diagnosis not present

## 2024-08-12 DIAGNOSIS — E119 Type 2 diabetes mellitus without complications: Secondary | ICD-10-CM | POA: Diagnosis not present

## 2024-08-12 DIAGNOSIS — W1809XA Striking against other object with subsequent fall, initial encounter: Secondary | ICD-10-CM | POA: Diagnosis not present

## 2024-08-12 MED ORDER — ACETAMINOPHEN 325 MG PO TABS
650.0000 mg | ORAL_TABLET | Freq: Once | ORAL | Status: AC
  Administered 2024-08-12: 650 mg via ORAL
  Filled 2024-08-12: qty 2

## 2024-08-12 NOTE — ED Notes (Signed)
 See triage note  Presents s/p fall  States she bent over to tie shoes  Clemens forward  Hitting head

## 2024-08-12 NOTE — ED Provider Notes (Signed)
 [facility] [place] Provider Note    Event Date/Time   First MD Initiated Contact with Patient 08/12/24 1309     (approximate)   History   Fall (/)   HPI  Sherry Jones is a 83 y.o. female history of diabetes, thyroid  disease, hypercholesterolemia, presents emergency department after a fall.  Patient was bending down to tie her shoes when she fell hitting her head on the bed rail.  No LOC.  Complaining of some pain on the scalp.  No bleeding.  Also complaining of right shoulder and right elbow pain.  Clemens prior to arrival today.      Physical Exam   Triage Vital Signs: ED Triage Vitals  Encounter Vitals Group     BP 08/12/24 1212 (!) 173/62     Girls Systolic BP Percentile --      Girls Diastolic BP Percentile --      Boys Systolic BP Percentile --      Boys Diastolic BP Percentile --      Pulse Rate 08/12/24 1212 82     Resp 08/12/24 1212 18     Temp 08/12/24 1212 97.6 F (36.4 C)     Temp Source 08/12/24 1212 Oral     SpO2 08/12/24 1212 98 %     Weight 08/12/24 1309 107 lb 2.3 oz (48.6 kg)     Height 08/12/24 1309 4' 5 (1.346 m)     Head Circumference --      Peak Flow --      Pain Score 08/12/24 1213 10     Pain Loc --      Pain Education --      Exclude from Growth Chart --     Most recent vital signs: Vitals:   08/12/24 1212  BP: (!) 173/62  Pulse: 82  Resp: 18  Temp: 97.6 F (36.4 C)  SpO2: 98%     General: Awake, no distress.   CV:  Good peripheral perfusion.  Resp:  Normal effort. Abd:  No distention.   Other:     ED Results / Procedures / Treatments   Labs (all labs ordered are listed, but only abnormal results are displayed) Labs Reviewed - No data to display   EKG     RADIOLOGY CT head, C-spine, x-ray right shoulder, right elbow    PROCEDURES:   Procedures  Critical Care:  no Chief Complaint  Patient presents with   Fall           MEDICATIONS ORDERED IN ED: Medications  acetaminophen   (TYLENOL ) tablet 650 mg (650 mg Oral Given 08/12/24 1355)     IMPRESSION / MDM / ASSESSMENT AND PLAN / ED COURSE  I reviewed the triage vital signs and the nursing notes.                              Differential diagnosis includes, but is not limited to, subdural, SAH, skull fracture, C-spine fracture, shoulder fracture, right elbow fracture, muscle strain, contusion  Patient's presentation is most consistent with acute illness / injury with system symptoms.   Medications given: Tylenol  p.o.  X-ray of the right shoulder, independently reviewed interpreted by me as being negative for any acute abnormality, CT cervical spine, did independently review and interpret radiologist reading as being negative for any acute abnormality, CT of the head, independently reviewed interpreted by me as being negative for acute abnormality  X-ray of the  right elbow independently reviewed interpreted by me as being negative for acute abnormality, pending radiology read  I did explain all findings to the patient.  Reassurance given.  She still apply ice to the scalp.  Return emergency department worsening.  Follow-up with her regular doctor as needed.  Patient is in agreement treatment plan.  Discharged stable condition in care of her daughter      FINAL CLINICAL IMPRESSION(S) / ED DIAGNOSES   Final diagnoses:  Fall, initial encounter  Minor head injury, initial encounter  Strain of right shoulder, initial encounter     Rx / DC Orders   ED Discharge Orders     None        Note:  This document was prepared using Dragon voice recognition software and may include unintentional dictation errors.    Gasper Devere ORN, PA-C 08/12/24 1427    Bradler, Evan K, MD 08/12/24 [phone]

## 2024-08-12 NOTE — Discharge Instructions (Signed)
 Follow-up with your regular doctor as needed.  Return emergency department for worsening. Your x-rays and CTs are all normal today.  They do not show any new acute findings. Tylenol  for pain as needed.

## 2024-08-12 NOTE — ED Triage Notes (Signed)
 Pt to ED via POV from home. Pt ambulatory to triage. Pt reports was bending over to tie her shoes and fell forward hitting head on night stand. Pt denies LOC. No blood thinner. Pt A&Ox4.

## 2025-05-13 ENCOUNTER — Ambulatory Visit
